# Patient Record
Sex: Female | Born: 1955 | Marital: Married | Smoking: Never smoker
Health system: Southern US, Community
[De-identification: ages and names within clinical notes are randomized; demographics above are authoritative.]

## PROBLEM LIST (undated history)

## (undated) DIAGNOSIS — IMO0002 Reserved for concepts with insufficient information to code with codable children: Secondary | ICD-10-CM

## (undated) DIAGNOSIS — K922 Gastrointestinal hemorrhage, unspecified: Secondary | ICD-10-CM

## (undated) DIAGNOSIS — M329 Systemic lupus erythematosus, unspecified: Secondary | ICD-10-CM

## (undated) DIAGNOSIS — I10 Essential (primary) hypertension: Secondary | ICD-10-CM

## (undated) HISTORY — PX: VARICOSE VEIN SURGERY: SHX832

---

## 1999-06-04 ENCOUNTER — Encounter: Payer: Self-pay | Admitting: Family Medicine

## 1999-06-04 ENCOUNTER — Ambulatory Visit (HOSPITAL_COMMUNITY): Admission: RE | Admit: 1999-06-04 | Discharge: 1999-06-04 | Payer: Self-pay | Admitting: Family Medicine

## 1999-07-21 ENCOUNTER — Other Ambulatory Visit: Admission: RE | Admit: 1999-07-21 | Discharge: 1999-07-21 | Payer: Self-pay | Admitting: Family Medicine

## 2000-04-07 ENCOUNTER — Encounter: Payer: Self-pay | Admitting: Family Medicine

## 2000-04-07 ENCOUNTER — Ambulatory Visit (HOSPITAL_COMMUNITY): Admission: RE | Admit: 2000-04-07 | Discharge: 2000-04-07 | Payer: Self-pay | Admitting: Family Medicine

## 2000-05-24 ENCOUNTER — Other Ambulatory Visit: Admission: RE | Admit: 2000-05-24 | Discharge: 2000-05-24 | Payer: Self-pay | Admitting: Obstetrics and Gynecology

## 2001-04-12 ENCOUNTER — Encounter: Payer: Self-pay | Admitting: Family Medicine

## 2001-04-12 ENCOUNTER — Ambulatory Visit (HOSPITAL_COMMUNITY): Admission: RE | Admit: 2001-04-12 | Discharge: 2001-04-12 | Payer: Self-pay | Admitting: Family Medicine

## 2001-06-28 ENCOUNTER — Other Ambulatory Visit: Admission: RE | Admit: 2001-06-28 | Discharge: 2001-06-28 | Payer: Self-pay | Admitting: Obstetrics and Gynecology

## 2002-06-05 ENCOUNTER — Other Ambulatory Visit: Admission: RE | Admit: 2002-06-05 | Discharge: 2002-06-05 | Payer: Self-pay | Admitting: Obstetrics and Gynecology

## 2003-07-26 ENCOUNTER — Other Ambulatory Visit: Admission: RE | Admit: 2003-07-26 | Discharge: 2003-07-26 | Payer: Self-pay | Admitting: Obstetrics and Gynecology

## 2003-08-21 ENCOUNTER — Ambulatory Visit (HOSPITAL_COMMUNITY): Admission: RE | Admit: 2003-08-21 | Discharge: 2003-08-21 | Payer: Self-pay | Admitting: Obstetrics and Gynecology

## 2004-08-28 ENCOUNTER — Other Ambulatory Visit: Admission: RE | Admit: 2004-08-28 | Discharge: 2004-08-28 | Payer: Self-pay | Admitting: Obstetrics and Gynecology

## 2008-01-09 ENCOUNTER — Ambulatory Visit (HOSPITAL_COMMUNITY): Admission: RE | Admit: 2008-01-09 | Discharge: 2008-01-09 | Payer: Self-pay | Admitting: *Deleted

## 2008-01-09 ENCOUNTER — Encounter (INDEPENDENT_AMBULATORY_CARE_PROVIDER_SITE_OTHER): Payer: Self-pay | Admitting: *Deleted

## 2009-02-25 ENCOUNTER — Other Ambulatory Visit: Admission: RE | Admit: 2009-02-25 | Discharge: 2009-02-25 | Payer: Self-pay | Admitting: Internal Medicine

## 2009-04-25 ENCOUNTER — Encounter: Admission: RE | Admit: 2009-04-25 | Discharge: 2009-04-25 | Payer: Self-pay | Admitting: Internal Medicine

## 2010-04-28 ENCOUNTER — Encounter: Admission: RE | Admit: 2010-04-28 | Discharge: 2010-04-28 | Payer: Self-pay | Admitting: Internal Medicine

## 2010-12-16 NOTE — Op Note (Signed)
NAMEJAIONNA, WEISSE                ACCOUNT NO.:  [id number]   MEDICAL RECORD NO.:  [id number]          PATIENT TYPE:  AMB   LOCATION:  ENDO                         FACILITY:  [facility]   PHYSICIAN:  Georgiana Spinner, M.D.    DATE OF BIRTH:  Dec 14, 1955   DATE OF PROCEDURE:  01/09/2008  DATE OF DISCHARGE:                               OPERATIVE REPORT   PROCEDURE:  Upper endoscopy.   INDICATIONS:  GERD.   ANESTHESIA:  Fentanyl 75 mcg, Versed 7.5 mg.   PROCEDURE:  With the patient mildly sedated in the left lateral  decubitus position, the Pentax videoscopic endoscope was inserted in the  mouth, passed under direct vision through the esophagus, which appeared  normal.  There was a hiatal hernia seen.  We entered into the stomach.  Fundus, body, antrum, duodenal bulb, second portion of the duodenum were  visualized.  From this point the endoscope was slowly withdrawn taking  circumferential views of the duodenal mucosa until the endoscope had  been pulled back into stomach, placed in retroflexion to view the  stomach from below.  The endoscope was straightened and withdrawn taking  circumferential views of the remaining gastric and esophageal mucosa.  The patient's vital signs and pulse oximetry remained stable.  The  patient tolerated the procedure well without apparent complications.   FINDINGS:  Loose wrap of the gastroesophageal junction indicating laxity  of the lower esophageal sphincter with a small hiatal hernia, otherwise  an unremarkable exam.   PLAN:  Proceed to colonoscopy.           ______________________________  Georgiana Spinner, M.D.     GMO/MEDQ  D:  01/09/2008  T:  01/09/2008  Job:  [id number]

## 2010-12-16 NOTE — Op Note (Signed)
[name], Amy Beard                ACCOUNT NO.:  [id number]   MEDICAL RECORD NO.:  [id number]          PATIENT TYPE:  AMB   LOCATION:  ENDO                         FACILITY:  [facility]   PHYSICIAN:  Georgiana Spinner, M.D.    DATE OF BIRTH:  1956-04-14   DATE OF PROCEDURE:  01/09/2008  DATE OF DISCHARGE:                               OPERATIVE REPORT   PROCEDURE:  Colonoscopy.   INDICATIONS:  Colon polyps, colon cancer screening.   ANESTHESIA:  Fentanyl 25 mcg, Versed 2.5 mg.   PROCEDURE:  With the patient mildly sedated in the left lateral  decubitus position, the Pentax videoscopic colonoscope was inserted in  the rectum and passed under direct vision with pressure applied to reach  the cecum, identified by ileocecal valve and appendiceal orifice, both  of which were photographed.  From this point the colonoscope was slowly  withdrawn taking circumferential views of colonic the mucosa, stopping  to photograph scattered diverticula seen throughout the colon until we  reached the rectum, where a polyp was seen on direct view and removed  using hot biopsy forceps technique, setting of 20/150 blended current,  and hemorrhoids were noted on retroflexed view.  The endoscope was  straightened and withdrawn.  The patient's vital signs and pulse  oximeter remained stable.  The patient tolerated the procedure well  without apparent complications.   FINDINGS:  Polyp of rectum, internal hemorrhoids and scattered  diverticula seen throughout the colon, fairly mild.   PLAN:  Await biopsy report.  The patient will call me for results and  follow up with me as needed as an outpatient.           ______________________________  Georgiana Spinner, M.D.     GMO/MEDQ  D:  01/09/2008  T:  01/09/2008  Job:  [id number]

## 2011-05-09 ENCOUNTER — Inpatient Hospital Stay (HOSPITAL_COMMUNITY)
Admission: EM | Admit: 2011-05-09 | Discharge: 2011-05-11 | DRG: 450 | Disposition: A | Discharge status: Home / Self Care | Payer: BC Managed Care – PPO | Attending: Internal Medicine | Admitting: Internal Medicine

## 2011-05-09 DIAGNOSIS — R000 Tachycardia, unspecified: Secondary | ICD-10-CM | POA: Diagnosis present

## 2011-05-09 DIAGNOSIS — R269 Unspecified abnormalities of gait and mobility: Secondary | ICD-10-CM | POA: Diagnosis present

## 2011-05-09 DIAGNOSIS — T6391XA Toxic effect of contact with unspecified venomous animal, accidental (unintentional), initial encounter: Principal | ICD-10-CM | POA: Diagnosis present

## 2011-05-09 DIAGNOSIS — Y998 Other external cause status: Secondary | ICD-10-CM

## 2011-05-09 DIAGNOSIS — R609 Edema, unspecified: Secondary | ICD-10-CM | POA: Diagnosis present

## 2011-05-09 DIAGNOSIS — T63121A Toxic effect of venom of other venomous lizard, accidental (unintentional), initial encounter: Secondary | ICD-10-CM | POA: Diagnosis present

## 2011-05-09 DIAGNOSIS — T63001A Toxic effect of unspecified snake venom, accidental (unintentional), initial encounter: Secondary | ICD-10-CM | POA: Diagnosis present

## 2011-05-09 DIAGNOSIS — Z7982 Long term (current) use of aspirin: Secondary | ICD-10-CM

## 2011-05-09 LAB — URINALYSIS, ROUTINE W REFLEX MICROSCOPIC
Leukocytes, UA: NEGATIVE
Nitrite: NEGATIVE
Specific Gravity, Urine: 1.008 (ref 1.005–1.030)
Urobilinogen, UA: 0.2 mg/dL (ref 0.0–1.0)
pH: 8 (ref 5.0–8.0)

## 2011-05-09 LAB — BASIC METABOLIC PANEL
BUN: 15 mg/dL (ref 6–23)
Chloride: 107 mEq/L (ref 96–112)
GFR calc Af Amer: >90 mL/min (ref >90)
GFR calc non Af Amer: >90 mL/min (ref >90)
Potassium: 3.5 mEq/L (ref 3.5–5.1)
Sodium: 143 mEq/L (ref 135–145)

## 2011-05-09 LAB — PROTIME-INR: Prothrombin Time: 13 seconds (ref 11.6–15.2)

## 2011-05-09 LAB — FIBRINOGEN: Fibrinogen: 431 mg/dL (ref 204–475)

## 2011-05-09 LAB — URINE MICROSCOPIC-ADD ON

## 2011-05-09 LAB — CBC
HCT: 40 % (ref 36.0–46.0)
Hemoglobin: 13 g/dL (ref 12.0–15.0)
MCH: 25.2 pg — ABNORMAL LOW (ref 26.0–34.0)
MCHC: 32.5 g/dL (ref 30.0–36.0)
RBC: 5.15 MIL/uL — ABNORMAL HIGH (ref 3.87–5.11)

## 2011-05-10 LAB — BASIC METABOLIC PANEL
BUN: 11 mg/dL (ref 6–23)
CO2: 25 mEq/L (ref 19–32)
Calcium: 9.4 mg/dL (ref 8.4–10.5)
Glucose, Bld: 132 mg/dL — ABNORMAL HIGH (ref 70–99)
Sodium: 141 mEq/L (ref 135–145)

## 2011-05-10 LAB — CBC
HCT: 38.8 % (ref 36.0–46.0)
Hemoglobin: 12.2 g/dL (ref 12.0–15.0)
MCH: 25.4 pg — ABNORMAL LOW (ref 26.0–34.0)
MCH: 25.5 pg — ABNORMAL LOW (ref 26.0–34.0)
MCHC: 31.9 g/dL (ref 30.0–36.0)
MCHC: 32.1 g/dL (ref 30.0–36.0)
MCHC: 32 g/dL (ref 30.0–36.0)
MCV: 79.5 fL (ref 78.0–100.0)
MCV: 79.3 fL (ref 78.0–100.0)
Platelets: 305 10*3/uL (ref 150–400)
Platelets: 321 10*3/uL (ref 150–400)
Platelets: 320 10*3/uL (ref 150–400)
Platelets: 281 10*3/uL (ref 150–400)
RBC: 4.87 MIL/uL (ref 3.87–5.11)
RBC: 4.89 MIL/uL (ref 3.87–5.11)
RDW: 13.8 % (ref 11.5–15.5)
RDW: 14 % (ref 11.5–15.5)
RDW: 14.1 % (ref 11.5–15.5)
RDW: 14.1 % (ref 11.5–15.5)
WBC: 10.1 10*3/uL (ref 4.0–10.5)
WBC: 11.1 10*3/uL — ABNORMAL HIGH (ref 4.0–10.5)

## 2011-05-10 LAB — FIBRINOGEN
Fibrinogen: 450 mg/dL (ref 204–475)
Fibrinogen: 462 mg/dL (ref 204–475)
Fibrinogen: 439 mg/dL (ref 204–475)
Fibrinogen: 414 mg/dL (ref 204–475)

## 2011-05-10 LAB — PROTIME-INR
INR: 0.97 (ref 0.00–1.49)
INR: 0.96 (ref 0.00–1.49)
Prothrombin Time: 13.5 seconds (ref 11.6–15.2)
Prothrombin Time: 13.7 seconds (ref 11.6–15.2)

## 2011-05-10 LAB — GLUCOSE, CAPILLARY
Glucose-Capillary: 112 mg/dL — ABNORMAL HIGH (ref 70–99)
Glucose-Capillary: 105 mg/dL — ABNORMAL HIGH (ref 70–99)

## 2011-05-11 LAB — URINE CULTURE

## 2011-05-11 LAB — GLUCOSE, CAPILLARY
Glucose-Capillary: 92 mg/dL (ref 70–99)
Glucose-Capillary: 91 mg/dL (ref 70–99)
Glucose-Capillary: 80 mg/dL (ref 70–99)
Glucose-Capillary: 89 mg/dL (ref 70–99)

## 2011-05-12 ENCOUNTER — Other Ambulatory Visit: Payer: Self-pay | Admitting: Internal Medicine

## 2011-05-12 DIAGNOSIS — Z1231 Encounter for screening mammogram for malignant neoplasm of breast: Secondary | ICD-10-CM

## 2011-05-14 NOTE — Discharge Summary (Signed)
  NAMERHONDA, LINAN                ACCOUNT NO.:  [id number]  MEDICAL RECORD NO.:  [id number]  LOCATION:  5012                         FACILITY:  [facility]  PHYSICIAN:  Hollice Espy, M.D.DATE OF BIRTH:  Oct 18, 1955  DATE OF ADMISSION:  05/09/2011 DATE OF DISCHARGE:  05/11/2011                              DISCHARGE SUMMARY   ATTENDING PHYSICIAN:  Hollice Espy, MD  PRIMARY CARE PHYSICIAN:  Juline Patch, MD  DISCHARGE DIAGNOSES:  Snake bite to right foot, venomous with secondary swelling and dysfunctional gait.  All diagnoses present on admission.  DISCHARGE MEDICATIONS: 1. Tylenol 650 p.o. q.4 h. p.r.n. 2. Motrin 200 mg 1-2 tabs every 8 hours as needed for inflammation. 3. OxyIR 5 mg p.o. q.4 h. p.r.n. for pain.  HOSPITAL COURSE:  The patient is a 55 year old African American female with no past medical history who was getting out of her car when all of a sudden she was felt a pain in her right foot, she looked down and had been bitten bite a small snake.  Reportedly the snake was killed and EMS arrived when the patient was having severe pain and swelling and unable to walk.  They evaluated the snake and they felt that perhaps it was a copperhead.  Regarding the patient's pain and swelling, she received 4 g of CroFab.  She was monitored in the hospital.  She had difficulty ambulating.  Dr. Lindie Spruce from Surgery was consulted to evaluate for compartment syndrome.  He felt the patient was stable.  PT was consulted and evaluated.  She did not felt having any acute PT needs but did need crutches.  The patient was given crutches.  She otherwise was subsequently doing well.  She was advised for the next week to not drive, to no work, and to keep her leg elevated with ambulation via crutches.  We advised her to do so and to be able to return back to work and to drive when she feels like she is able to fully flex her foot.  In the meantime I am advising some motion to prevent DVT.   Her discharge diet is regular diet.  Disposition improved.  Activity slow to increase and described as above and she will follow up with her PCP Dr. Juline Patch in the next month as needed.     Hollice Espy, M.D.     SKK/MEDQ  D:  05/11/2011  T:  05/11/2011  Job:  [id number]  cc:   Cherylynn Ridges, M.D. Juline Patch, M.D.  Electronically Signed by Virginia Rochester M.D. on 05/14/2011 10:36:17 AM

## 2011-05-16 LAB — CULTURE, BLOOD (ROUTINE X 2)
Culture  Setup Time: 201210071141
Culture: NO GROWTH

## 2011-05-21 NOTE — H&P (Signed)
NAMEPERSEPHONE, SCHRIEVER NO.:  [id number]  MEDICAL RECORD NO.:  [id number]  LOCATION:  [facility]                         FACILITY:  [facility]  PHYSICIAN:  Carlota Raspberry, MD         DATE OF BIRTH:  06/10/1956  DATE OF ADMISSION:  05/09/2011 DATE OF DISCHARGE:                             HISTORY & PHYSICAL   PRIMARY CARE PHYSICIAN:  Juline Patch, MD  CHIEF COMPLAINT:  Snake bite with right foot swelling and pain.  HISTORY OF PRESENT ILLNESS:  This is a 55 year old female with no major medical problems who was getting out of a car to go to a family event due to the death of a family member.  When she got out of her car, she felt a bite on her right foot and her husband saw a snake that was approximately 2-3 feet.  It is unclear, but the snake was possibly killed and EMS took a picture and per verbal sign out from the ED, the snake appeared to be consistent with a baby copperhead.  She was brought to the emergency room by EMS and found to be 141/82, pulse 111, 18, 98.3 and 99% on 4 L nasal cannula.  A snake bite emergency management protocol was initiated in the ED and she was felt to have minimal to moderate criteria according to the protocol for an initial tachycardia of 100-125 and pain, swelling and ecchymosis of the extremity. Therefore, 4 g of CroFab antibody was given to the patient.  Her workup in the ED including CBC, chemistry coags and fibrinogen were otherwise unremarkable.  She is being admitted to the Step-Down Unit for further monitoring.  In discussion with the patient, her husband and her son who are at the bedside, they relate the above history and that she is doing much better now that she has been given pain medications.  She is currently without any complaints and the right foot is doing better.  Otherwise, review of systems extensively negative.  PAST MEDICAL HISTORY:  GERD, hiatal hernia.  Otherwise, they deny any history of hypertension, diabetes,  cancer, CVAs, cardiac issues or any other medical problems.  MEDICATIONS:  She takes a baby aspirin prophylactically.  ALLERGIES:  She has no known drug allergies.  SOCIAL HISTORY:  She lives at home with her husband and has 2 sons.  She is a never smoker and does not drink or do any drugs.  FAMILY HISTORY:  Her mother and father both had hypertension and diabetes.  PHYSICAL EXAMINATION:  VITAL SIGNS:  Blood pressure is 112/64 ranging 104-115/50s-70s on the ED monitor, pulse of 70, 100% on room air. GENERAL:  She is a average-sized lady who is not obese.  She is lying in the ED stretcher and appears groggy from the pain medicines, but is able to awaken to voice answer questions, but dozes off.  She is in no distress at present. HEENT:  Pupils are equal, round and reactive to light.  Extraocular muscles are intact.  Her sclerae clear.  Her mouth is normal, but her tongue appears very dry. LUNGS:  Clear to auscultation bilaterally.  No wheezes, crackles or rhonchi and overall unimpressive.  HEART:  Regular rate and rhythm with no murmurs or gallops appreciated. ABDOMEN:  A bit overweight, but it is soft, nontender, nondistended and benign. EXTREMITIES:  Her bilateral upper extremities are unimpressive with bilateral radial pulses palpable and normal muscle tone and strength with an 18-gauge in her left hand.  Her left lower extremity is also completely normal.  However, her right foot shows 2 bloody scab covered puncture marks on the lateral aspect of her right foot about 2-cm proximal to the right fifth MTP joint.  There is gross swelling extending from these puncture marks going up to her ankle and wrapping around posteriorly around her ankle.  There is also a purplish grayish duskiness to her right foot that is not present on the left.  It is minimally tender and a bit more cool compared to the left, but it does not appear acutely ischemic.  She is able to move the foot.  There  is no cyanosis.  LABORATORY DATA:  White blood cell count of 9.7, hematocrit 40.0, platelets 327.  INR is 0.96.  Fibrinogen 431.  Chemistry is completely normal including renal function of 15 and 0.6, calcium is 10.0.  UA is cloudy with trace blood, otherwise normal and U culture is pending.  There is no radiography to review.  IMPRESSION:  This is a 55 year old healthy female who was getting out of her car earlier tonight and felt a bite that maybe consistent with a copperhead per picture taken by EMS. 1. Copperhead bite to right foot per the hospital protocol for snake     bite emergencies.  She had 3 points on the severity criteria for an     initial tachycardia and pain and swelling of the wound.  She was     given 4 g of CroFab antidote.  We will admit her to step-down and     monitor her overnight for response and continued to monitor her     vital signs and cardiopulmonary systems.  We will also monitor labs     (CBC, coags and fibrinogen q.4 h. x12 hours and BMET).  She may     need a PT consult in the morning to see if she will be able to walk     on this foot. 2. There are no other acute medical issues. 3. Fluid electrolytes and nutrition.  We will run her continuous 100     mL of normal saline for 2 L and give her regular diet. 4. Prophylaxis SCDs.  I will avoid subcutaneous heparin given the     question of coagulopathy in the setting of a snake bite.  Also;     Zofran, Colace senna, Tylenol, oxycodone and Dilaudid p.r.n. for     foot pain.  CODE STATUS: 1. She is a full code as I discussed with her and her husband. 2. IV access.  She has an 18-gauge in her left hand. 3. The patient will be admitted to the Step-Down Unit to [facility]     Team 7.          ______________________________ Carlota Raspberry, MD     EB/MEDQ  D:  05/10/2011  T:  05/10/2011  Job:  [id number]  Electronically Signed by Carlota Raspberry MD on 05/21/2011 07:46:48 PM

## 2011-06-04 ENCOUNTER — Other Ambulatory Visit: Payer: Self-pay | Admitting: Internal Medicine

## 2011-06-04 DIAGNOSIS — M79606 Pain in leg, unspecified: Secondary | ICD-10-CM

## 2011-06-05 ENCOUNTER — Ambulatory Visit (INDEPENDENT_AMBULATORY_CARE_PROVIDER_SITE_OTHER): Payer: BC Managed Care – PPO | Admitting: *Deleted

## 2011-06-05 DIAGNOSIS — M7989 Other specified soft tissue disorders: Secondary | ICD-10-CM

## 2011-06-05 DIAGNOSIS — M79609 Pain in unspecified limb: Secondary | ICD-10-CM

## 2011-06-08 NOTE — Procedures (Unsigned)
DUPLEX DEEP VENOUS EXAM - LOWER EXTREMITY  INDICATION:  Swelling, tenderness in the calf, rule out DVT.  HISTORY:  Edema:  Right foot and ankle swelling. Trauma/Surgery:  Recent right foot snake bite. Pain:  Right foot/ankle pain. PE:  No. Previous DVT:  No. Anticoagulants: Other:  DUPLEX EXAM:               CFV   SFV   PopV  PTV    GSV               R  L  R  L  R  L  R   L  R  L Thrombosis    o  o  o     o     o      o Spontaneous   +  +  +     +     +      + Phasic        +  +  +     +     +      + Augmentation  +  +  +     +     +      + Compressible  +  +  +     +     +      + Competent  Legend:  + - yes  o - no  p - partial  D - decreased  IMPRESSION: 1. No evidence of deep or superficial venous thrombosis noted in the     right lower extremity. 2. Preliminary findings stating the patency and compressibility of the     right lower extremity venous system was called to Dr. Ricki Miller at 11:30     a.m. on 06/05/2011.   _____________________________ Quita Skye. Hart Rochester, M.D.  CH/MEDQ  D:  06/05/2011  T:  06/05/2011  Job:  [id number]

## 2011-06-18 ENCOUNTER — Ambulatory Visit: Payer: Self-pay

## 2011-06-22 ENCOUNTER — Ambulatory Visit
Admission: RE | Admit: 2011-06-22 | Discharge: 2011-06-22 | Disposition: A | Discharge status: Home / Self Care | Payer: Self-pay | Source: Ambulatory Visit | Attending: Internal Medicine | Admitting: Internal Medicine

## 2011-06-22 DIAGNOSIS — Z1231 Encounter for screening mammogram for malignant neoplasm of breast: Secondary | ICD-10-CM

## 2012-06-03 ENCOUNTER — Other Ambulatory Visit: Payer: Self-pay | Admitting: Internal Medicine

## 2012-06-03 DIAGNOSIS — Z1231 Encounter for screening mammogram for malignant neoplasm of breast: Secondary | ICD-10-CM

## 2012-07-06 ENCOUNTER — Ambulatory Visit: Payer: Self-pay

## 2012-07-14 ENCOUNTER — Ambulatory Visit
Admission: RE | Admit: 2012-07-14 | Discharge: 2012-07-14 | Disposition: A | Discharge status: Home / Self Care | Payer: BC Managed Care – PPO | Source: Ambulatory Visit | Attending: Internal Medicine | Admitting: Internal Medicine

## 2012-07-14 DIAGNOSIS — Z1231 Encounter for screening mammogram for malignant neoplasm of breast: Secondary | ICD-10-CM

## 2013-02-01 ENCOUNTER — Other Ambulatory Visit: Payer: Self-pay | Admitting: Internal Medicine

## 2013-02-01 DIAGNOSIS — E041 Nontoxic single thyroid nodule: Secondary | ICD-10-CM

## 2013-02-02 ENCOUNTER — Other Ambulatory Visit: Payer: Self-pay

## 2013-02-02 ENCOUNTER — Ambulatory Visit
Admission: RE | Admit: 2013-02-02 | Discharge: 2013-02-02 | Disposition: A | Discharge status: Home / Self Care | Payer: BC Managed Care – PPO | Source: Ambulatory Visit | Attending: Internal Medicine | Admitting: Internal Medicine

## 2013-02-02 DIAGNOSIS — E041 Nontoxic single thyroid nodule: Secondary | ICD-10-CM

## 2013-02-08 ENCOUNTER — Other Ambulatory Visit: Payer: Self-pay | Admitting: Internal Medicine

## 2013-02-08 DIAGNOSIS — E049 Nontoxic goiter, unspecified: Secondary | ICD-10-CM

## 2013-06-22 ENCOUNTER — Other Ambulatory Visit: Payer: Self-pay

## 2013-06-22 DIAGNOSIS — Z1231 Encounter for screening mammogram for malignant neoplasm of breast: Secondary | ICD-10-CM

## 2013-07-19 ENCOUNTER — Ambulatory Visit
Admission: RE | Admit: 2013-07-19 | Discharge: 2013-07-19 | Disposition: A | Discharge status: Home / Self Care | Payer: BC Managed Care – PPO | Source: Ambulatory Visit

## 2013-07-19 DIAGNOSIS — Z1231 Encounter for screening mammogram for malignant neoplasm of breast: Secondary | ICD-10-CM

## 2013-08-14 ENCOUNTER — Other Ambulatory Visit: Payer: Self-pay

## 2013-09-08 ENCOUNTER — Ambulatory Visit
Admission: RE | Admit: 2013-09-08 | Discharge: 2013-09-08 | Disposition: A | Discharge status: Home / Self Care | Payer: BC Managed Care – PPO | Source: Ambulatory Visit | Attending: Internal Medicine | Admitting: Internal Medicine

## 2013-09-08 DIAGNOSIS — E049 Nontoxic goiter, unspecified: Secondary | ICD-10-CM

## 2013-10-09 ENCOUNTER — Other Ambulatory Visit (HOSPITAL_COMMUNITY)
Admission: RE | Admit: 2013-10-09 | Discharge: 2013-10-09 | Disposition: A | Discharge status: Home / Self Care | Payer: BC Managed Care – PPO | Source: Ambulatory Visit | Attending: Internal Medicine | Admitting: Internal Medicine

## 2013-10-09 ENCOUNTER — Other Ambulatory Visit: Payer: Self-pay | Admitting: Registered Nurse

## 2013-10-09 DIAGNOSIS — Z01419 Encounter for gynecological examination (general) (routine) without abnormal findings: Secondary | ICD-10-CM | POA: Insufficient documentation

## 2013-12-13 ENCOUNTER — Emergency Department (HOSPITAL_COMMUNITY): Payer: BC Managed Care – PPO

## 2013-12-13 ENCOUNTER — Encounter (HOSPITAL_COMMUNITY): Payer: Self-pay | Admitting: Emergency Medicine

## 2013-12-13 DIAGNOSIS — I10 Essential (primary) hypertension: Secondary | ICD-10-CM | POA: Insufficient documentation

## 2013-12-13 DIAGNOSIS — Z7982 Long term (current) use of aspirin: Secondary | ICD-10-CM | POA: Insufficient documentation

## 2013-12-13 DIAGNOSIS — R209 Unspecified disturbances of skin sensation: Secondary | ICD-10-CM | POA: Insufficient documentation

## 2013-12-13 LAB — CBC
HCT: 39.9 % (ref 36.0–46.0)
Hemoglobin: 12.7 g/dL (ref 12.0–15.0)
MCH: 25.4 pg — AB (ref 26.0–34.0)
MCHC: 31.8 g/dL (ref 30.0–36.0)
MCV: 79.8 fL (ref 78.0–100.0)
PLATELETS: 332 10*3/uL (ref 150–400)
RBC: 5 MIL/uL (ref 3.87–5.11)
RDW: 13.8 % (ref 11.5–15.5)
WBC: 7.1 10*3/uL (ref 4.0–10.5)

## 2013-12-13 LAB — BASIC METABOLIC PANEL
BUN: 10 mg/dL (ref 6–23)
CALCIUM: 9.7 mg/dL (ref 8.4–10.5)
CO2: 27 mEq/L (ref 19–32)
Chloride: 100 mEq/L (ref 96–112)
Creatinine, Ser: 0.65 mg/dL (ref 0.50–1.10)
Glucose, Bld: 96 mg/dL (ref 70–99)
Potassium: 4.3 mEq/L (ref 3.7–5.3)
SODIUM: 139 meq/L (ref 137–147)

## 2013-12-13 LAB — I-STAT TROPONIN, ED: TROPONIN I, POC: 0 ng/mL (ref 0.00–0.08)

## 2013-12-13 NOTE — ED Notes (Addendum)
Pt sattes she has been having HTN at home for two days similar to her triage BP.  Pt states some numbness in her face.  Pt states headache earlier but took 650mg  of tylenol prior to arrival

## 2013-12-14 ENCOUNTER — Emergency Department (HOSPITAL_COMMUNITY)
Admission: EM | Admit: 2013-12-14 | Discharge: 2013-12-14 | Disposition: A | Discharge status: Home / Self Care | Payer: BC Managed Care – PPO | Attending: Emergency Medicine | Admitting: Emergency Medicine

## 2013-12-14 DIAGNOSIS — I10 Essential (primary) hypertension: Secondary | ICD-10-CM

## 2013-12-14 HISTORY — DX: Essential (primary) hypertension: I10

## 2013-12-14 NOTE — Discharge Instructions (Signed)
Continue checking your blood pressure and keeping a log.  Check twice a day.  Follow up as scheduled.  Return to the ER for worsening condition or new concerning symptoms.   How to Take Your Blood Pressure  These instructions are only for electronic home blood pressure machines. You will need:   An automatic or semi-automatic blood pressure machine.  Fresh batteries for the blood pressure machine. HOW DO I USE THESE TOOLS TO CHECK MY BLOOD PRESSURE?   There are 2 numbers that make up your blood pressure. For example: 120/80.  The first number (120 in our example) is called the "systolic pressure." It is a measure of the pressure in your blood vessels when your heart is pumping blood.  The second number (80 in our example) is called the "diastolic pressure." It is a measure of the pressure in your blood vessels when your heart is resting between beats.  Before you buy a home blood pressure machine, check the size of your arm so you can buy the right size cuff. Here is how to check the size of your arm:  Use a tape measure that shows both inches and centimeters.  Wrap the tape measure around the middle upper part of your arm. You may need someone to help you measure right.  Write down your arm measurement in both inches and centimeters.  To measure your blood pressure right, it is important to have the right size cuff.  If your arm is up to 13 inches (37 to 34 centimeters), get an adult cuff size.  If your arm is 13 to 17 inches (35 to 44 centimeters), get a large adult cuff size.  If your arm is 17 to 20 inches (45 to 52 centimeters), get an adult thigh cuff.  Try to rest or relax for at least 30 minutes before you check your blood pressure.  Do not smoke.  Do not have any drinks with caffeine, such as:  Pop.  Coffee.  Tea.  Check your blood pressure in a quiet room.  Sit down and stretch out your arm on a table. Keep your arm at about the level of your heart. Let your  arm relax. GETTING BLOOD PRESSURE READINGS  Make sure you remove any tight-fighting clothing from your arm. Wrap the cuff around your upper arm. Wrap it just above the bend, and above where you felt the pulse. You should be able to slip a finger between the cuff and your arm. If you cannot slip a finger in the cuff, it is too tight and should be removed and rewrapped.  Some units requires you to manually pump up the arm cuff.  Automatic units inflate the cuff when you press a button.  Cuff deflation is automatic in both models.  After the cuff is inflated, the unit measures your blood pressure and pulse. The readings are displayed on a monitor. Hold still and breathe normally while the cuff is inflated.  Getting a reading takes less than a minute.  Some models store readings in a memory. Some provide a printout of readings.  Get readings at different times of the day. You should wait at least 5 minutes between readings. Take readings with you to your next doctor's visit. Document Released: 07/02/2008 Document Revised: 10/12/2011 Document Reviewed: 07/02/2008 [facility] Patient Information 2014 [place], [place].  DASH Diet The DASH diet stands for "Dietary Approaches to Stop Hypertension." It is a healthy eating plan that has been shown to reduce high blood pressure (  hypertension) in as little as 14 days, while also possibly providing other significant health benefits. These other health benefits include reducing the risk of breast cancer after menopause and reducing the risk of type 2 diabetes, heart disease, colon cancer, and stroke. Health benefits also include weight loss and slowing kidney failure in patients with chronic kidney disease.  DIET GUIDELINES  Limit salt (sodium). Your diet should contain less than 1500 mg of sodium daily.  Limit refined or processed carbohydrates. Your diet should include mostly whole grains. Desserts and added sugars should be used sparingly.  Include  small amounts of heart-healthy fats. These types of fats include nuts, oils, and tub margarine. Limit saturated and trans fats. These fats have been shown to be harmful in the body. CHOOSING FOODS  The following food groups are based on a 2000 calorie diet. See your Registered Dietitian for individual calorie needs. Grains and Grain Products (6 to 8 servings daily)  Eat More Often: Whole-wheat bread, brown rice, whole-grain or wheat pasta, quinoa, popcorn without added fat or salt (air popped).  Eat Less Often: White bread, white pasta, white rice, cornbread. Vegetables (4 to 5 servings daily)  Eat More Often: Fresh, frozen, and canned vegetables. Vegetables may be raw, steamed, roasted, or grilled with a minimal amount of fat.  Eat Less Often/Avoid: Creamed or fried vegetables. Vegetables in a cheese sauce. Fruit (4 to 5 servings daily)  Eat More Often: All fresh, canned (in natural juice), or frozen fruits. Dried fruits without added sugar. One hundred percent fruit juice ( cup [237 mL] daily).  Eat Less Often: Dried fruits with added sugar. Canned fruit in light or heavy syrup. Foot LockerLean Meats, Fish, and Poultry (2 servings or less daily. One serving is 3 to 4 oz [85-114 g]).  Eat More Often: Ninety percent or leaner ground beef, tenderloin, sirloin. Round cuts of beef, chicken breast, Mal[place]key breast. All fish. Grill, bake, or broil your meat. Nothing should be fried.  Eat Less Often/Avoid: Fatty cuts of meat, [place]key, or chicken leg, thigh, or wing. Fried cuts of meat or fish. Dairy (2 to 3 servings)  Eat More Often: Low-fat or fat-free milk, low-fat plain or light yogurt, reduced-fat or part-skim cheese.  Eat Less Often/Avoid: Milk (whole, 2%).Whole milk yogurt. Full-fat cheeses. Nuts, Seeds, and Legumes (4 to 5 servings per week)  Eat More Often: All without added salt.  Eat Less Often/Avoid: Salted nuts and seeds, canned beans with added salt. Fats and Sweets (limited)  Eat More  Often: Vegetable oils, tub margarines without trans fats, sugar-free gelatin. Mayonnaise and salad dressings.  Eat Less Often/Avoid: Coconut oils, palm oils, butter, stick margarine, cream, half and half, cookies, candy, pie. FOR MORE INFORMATION The Dash Diet Eating Plan: www.dashdiet.org Document Released: 07/09/2011 Document Revised: 10/12/2011 Document Reviewed: 07/09/2011 [facility]Exit[facility] Patient Information 2014 [place]ExitCare, [place]LL[place].

## 2013-12-14 NOTE — ED Provider Notes (Signed)
CSN: [id number]     Arrival date & time 12/13/13  2257 History   First MD Initiated Contact with Patient 12/14/13 0123     Chief Complaint  Patient presents with  . Hypertension     (Consider location/radiation/quality/duration/timing/severity/associated sxs/prior Treatment) HPI 58 yo female presents to the ER from home with complaint of elevated bp and numbness.  Over the last two days she has had BPs 150s/90.  She has been checking her bp on the advice from her pcm in March, has f/u with him on 5/26 for recheck.  Today along with the elevate BP she had a headache and a patch of numbness to the left outer upper calf and the left crease of her face.  Pt took two different herbal teas for stress and high BP, but doesn't feel they helped.  No weakness. HA has resolved with tylenol.  No prior h/o stroke, htn.  Takes daily aspirin.  Numbness has since resolved. Past Medical History  Diagnosis Date  . Hypertension    History reviewed. No pertinent past surgical history. No family history on file. History  Substance Use Topics  . Smoking status: Never Smoker   . Smokeless tobacco: Not on file  . Alcohol Use: No   OB History   Grav Para Term Preterm Abortions TAB SAB Ect Mult Living                 Review of Systems   See History of Present Illness; otherwise all other systems are reviewed and negative  Allergies  Review of patient's allergies indicates no known allergies.  Home Medications   Prior to Admission medications   Medication Sig Start Date End Date Taking? Authorizing Provider  aspirin EC 81 MG tablet Take 81 mg by mouth daily.   Yes Historical Provider, MD  Vitamin D, Ergocalciferol, (DRISDOL) 50000 UNITS CAPS capsule Take 50,000 Units by mouth every 7 (seven) days.   Yes Historical Provider, MD   BP 123/78  Pulse 96  Temp(Src) 97.8 F (36.6 C) (Oral)  Resp 18  SpO2 95% Physical Exam  Nursing note and vitals reviewed. Constitutional: She is oriented to person,  place, and time. She appears well-developed and well-nourished.  HENT:  Head: Normocephalic and atraumatic.  Right Ear: External ear normal.  Left Ear: External ear normal.  Nose: Nose normal.  Mouth/Throat: Oropharynx is clear and moist.  Eyes: Conjunctivae and EOM are normal. Pupils are equal, round, and reactive to light.  Neck: Normal range of motion. Neck supple. No JVD present. No tracheal deviation present. No thyromegaly present.  Cardiovascular: Normal rate, regular rhythm, normal heart sounds and intact distal pulses.  Exam reveals no gallop and no friction rub.   No murmur heard. Pulmonary/Chest: Effort normal and breath sounds normal. No stridor. No respiratory distress. She has no wheezes. She has no rales. She exhibits no tenderness.  Abdominal: Soft. Bowel sounds are normal. She exhibits no distension and no mass. There is no tenderness. There is no rebound and no guarding.  Musculoskeletal: Normal range of motion. She exhibits no edema and no tenderness.  Lymphadenopathy:    She has no cervical adenopathy.  Neurological: She is alert and oriented to person, place, and time. She has normal reflexes. No cranial nerve deficit. She exhibits normal muscle tone. Coordination normal.  Skin: Skin is warm and dry. No rash noted. No erythema. No pallor.  Psychiatric: She has a normal mood and affect. Her behavior is normal. Judgment and thought content normal.  ED Course  Procedures (including critical care time) Labs Review Labs Reviewed  CBC - Abnormal; Notable for the following:    MCH 25.4 (*)    All other components within normal limits  BASIC METABOLIC PANEL  I-STAT TROPOININ, ED    Imaging Review Dg Chest 2 View  12/14/2013   CLINICAL DATA:  Hypertension  EXAM: CHEST  2 VIEW  COMPARISON:  None.  FINDINGS: The heart size and mediastinal contours are within normal limits. Both lungs are clear. The visualized skeletal structures are unremarkable.  IMPRESSION: No active  cardiopulmonary disease.   Electronically Signed   By: Alcide CleverMark  Lukens M.D.   On: 12/14/2013 00:05     EKG Interpretation   Date/Time:  Wednesday Dec 13 2013 23:09:38 EDT Ventricular Rate:  72 PR Interval:  140 QRS Duration: 110 QT Interval:  434 QTC Calculation: 475 R Axis:   -6 Text Interpretation:  Normal sinus rhythm Incomplete right bundle branch  block Borderline ECG No old tracing to compare Confirmed by Kenidy Crossland  MD,  Avereigh Spainhower ([id number]) on 12/14/2013 12:47:56 AM      MDM   Final diagnoses:  Hypertension    58 year old female with transitory paresthesias, not in a typical distribution associated with elevated blood pressure which has since resolved without intervention.  Patient has close followup scheduled with her primary care Dr.  Davina PokeInstructed patient to continue with her blood pressure monitoring.  Precautions given for return.   [name], MD 12/14/13 [phone]

## 2014-09-10 ENCOUNTER — Other Ambulatory Visit: Payer: Self-pay

## 2014-09-10 DIAGNOSIS — Z1231 Encounter for screening mammogram for malignant neoplasm of breast: Secondary | ICD-10-CM

## 2014-09-18 ENCOUNTER — Ambulatory Visit
Admission: RE | Admit: 2014-09-18 | Discharge: 2014-09-18 | Disposition: A | Discharge status: Home / Self Care | Payer: BLUE CROSS/BLUE SHIELD | Source: Ambulatory Visit

## 2014-09-18 ENCOUNTER — Encounter (INDEPENDENT_AMBULATORY_CARE_PROVIDER_SITE_OTHER): Payer: Self-pay

## 2014-09-18 DIAGNOSIS — Z1231 Encounter for screening mammogram for malignant neoplasm of breast: Secondary | ICD-10-CM

## 2015-08-06 ENCOUNTER — Other Ambulatory Visit: Payer: Self-pay | Admitting: Internal Medicine

## 2015-08-06 DIAGNOSIS — R200 Anesthesia of skin: Secondary | ICD-10-CM

## 2015-08-13 ENCOUNTER — Other Ambulatory Visit: Payer: Self-pay | Admitting: Internal Medicine

## 2015-08-13 DIAGNOSIS — R200 Anesthesia of skin: Secondary | ICD-10-CM

## 2015-08-21 ENCOUNTER — Ambulatory Visit
Admission: RE | Admit: 2015-08-21 | Discharge: 2015-08-21 | Disposition: A | Discharge status: Home / Self Care | Payer: BLUE CROSS/BLUE SHIELD | Source: Ambulatory Visit | Attending: Internal Medicine | Admitting: Internal Medicine

## 2015-08-21 DIAGNOSIS — R200 Anesthesia of skin: Secondary | ICD-10-CM

## 2015-08-25 ENCOUNTER — Ambulatory Visit
Admission: RE | Admit: 2015-08-25 | Discharge: 2015-08-25 | Disposition: A | Discharge status: Home / Self Care | Payer: BLUE CROSS/BLUE SHIELD | Source: Ambulatory Visit | Attending: Internal Medicine | Admitting: Internal Medicine

## 2015-08-25 MED ORDER — GADOBENATE DIMEGLUMINE 529 MG/ML IV SOLN
20.0000 mL | Freq: Once | INTRAVENOUS | Status: AC | PRN
  Administered 2015-08-25: 20 mL via INTRAVENOUS

## 2015-12-18 ENCOUNTER — Encounter (HOSPITAL_COMMUNITY): Payer: Self-pay | Admitting: Family Medicine

## 2015-12-18 ENCOUNTER — Emergency Department (HOSPITAL_COMMUNITY): Payer: BLUE CROSS/BLUE SHIELD

## 2015-12-18 ENCOUNTER — Emergency Department (HOSPITAL_COMMUNITY)
Admission: EM | Admit: 2015-12-18 | Discharge: 2015-12-18 | Disposition: A | Discharge status: Home / Self Care | Payer: BLUE CROSS/BLUE SHIELD | Attending: Emergency Medicine | Admitting: Emergency Medicine

## 2015-12-18 DIAGNOSIS — Z1231 Encounter for screening mammogram for malignant neoplasm of breast: Secondary | ICD-10-CM

## 2015-12-18 DIAGNOSIS — I10 Essential (primary) hypertension: Secondary | ICD-10-CM | POA: Insufficient documentation

## 2015-12-18 DIAGNOSIS — Z79899 Other long term (current) drug therapy: Secondary | ICD-10-CM | POA: Insufficient documentation

## 2015-12-18 DIAGNOSIS — Z7982 Long term (current) use of aspirin: Secondary | ICD-10-CM | POA: Insufficient documentation

## 2015-12-18 DIAGNOSIS — Z8719 Personal history of other diseases of the digestive system: Secondary | ICD-10-CM | POA: Insufficient documentation

## 2015-12-18 DIAGNOSIS — R079 Chest pain, unspecified: Secondary | ICD-10-CM | POA: Diagnosis present

## 2015-12-18 DIAGNOSIS — R0789 Other chest pain: Secondary | ICD-10-CM | POA: Diagnosis not present

## 2015-12-18 LAB — CBC
HCT: 40 % (ref 36.0–46.0)
HEMOGLOBIN: 12.6 g/dL (ref 12.0–15.0)
MCH: 25.2 pg — ABNORMAL LOW (ref 26.0–34.0)
MCHC: 31.5 g/dL (ref 30.0–36.0)
MCV: 80 fL (ref 78.0–100.0)
Platelets: 320 10*3/uL (ref 150–400)
RBC: 5 MIL/uL (ref 3.87–5.11)
RDW: 14.2 % (ref 11.5–15.5)
WBC: 6.8 10*3/uL (ref 4.0–10.5)

## 2015-12-18 LAB — I-STAT TROPONIN, ED
TROPONIN I, POC: 0 ng/mL (ref 0.00–0.08)
Troponin i, poc: 0 ng/mL (ref 0.00–0.08)

## 2015-12-18 LAB — BASIC METABOLIC PANEL
Anion gap: 10 (ref 5–15)
BUN: 9 mg/dL (ref 6–20)
CO2: 25 mmol/L (ref 22–32)
Calcium: 9.2 mg/dL (ref 8.9–10.3)
Chloride: 105 mmol/L (ref 101–111)
Creatinine, Ser: 0.7 mg/dL (ref 0.44–1.00)
GFR calc Af Amer: >60 mL/min (ref >60)
Glucose, Bld: 89 mg/dL (ref 65–99)
Potassium: 3.8 mmol/L (ref 3.5–5.1)
Sodium: 140 mmol/L (ref 135–145)

## 2015-12-18 NOTE — ED Notes (Signed)
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 sts sharp stabbing. sts some nausea. sts she hurts when she takes a deep breath.

## 2015-12-19 NOTE — ED Provider Notes (Signed)
CSN: [id number]     Arrival date & time 12/18/15  1154 History   First MD Initiated Contact with Patient 12/18/15 1310     Chief Complaint  Patient presents with  . Chest Pain    HPI Comments: 60 year old female presents with acute onset of chest pain starting several hours ago. She has never had this pain before. PMH significant for HTN which she states is not treated because it has gotten better and GERD. 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 She tried to sit down and take an ASA to see if the pain would go away however it did not so she decided to come to the ED. The pain is on the left side of her chest, is constant, stabbing, does not radiate. Worse with inspiration and movement. It has gotten better since she has been in the ER. No personal cardiac history. No tobacco use. Denies fever, diaphoresis, headache, palpitations, leg swelling, SOB, cough, abdominal pain, N/V.  Patient is a 60 y.o. female presenting with chest pain.  Chest Pain Associated symptoms: no abdominal pain, no cough, no fever, no nausea, no palpitations, no shortness of breath and not vomiting     Past Medical History  Diagnosis Date  . Hypertension    History reviewed. No pertinent past surgical history. History reviewed. No pertinent family history. Social History  Substance Use Topics  . Smoking status: Never Smoker   . Smokeless tobacco: None  . Alcohol Use: No   OB History    No data available     Review of Systems  Constitutional: Negative for fever.  Respiratory: Negative for cough and shortness of breath.   Cardiovascular: Positive for chest pain. Negative for palpitations and leg swelling.  Gastrointestinal: Negative for nausea, vomiting and abdominal pain.  All other systems reviewed and are negative.     Allergies  Review of patient's allergies indicates no known allergies.  Home Medications   Prior to Admission medications    Medication Sig Start Date End Date Taking? Authorizing Provider  aspirin EC 81 MG tablet Take 81 mg by mouth daily.   Yes Historical Provider, MD  Vitamin D, Ergocalciferol, (DRISDOL) 50000 UNITS CAPS capsule Take 50,000 Units by mouth every 7 (seven) days.   Yes Historical Provider, MD   BP 149/81 mmHg  Pulse 82  Temp(Src) 98.2 F (36.8 C) (Oral)  Resp 17  Ht  (1.702 m)  Wt 99.338 kg  BMI 34.29 kg/m2  SpO2 97% Physical Exam  Constitutional: She is oriented to person, place, and time. She appears well-developed and well-nourished. No distress.  HENT:  Head: Normocephalic and atraumatic.  Eyes: Conjunctivae are normal. Pupils are equal, round, and reactive to light. Right eye exhibits no discharge. Left eye exhibits no discharge. No scleral icterus.  Neck: Normal range of motion.  Cardiovascular: Normal rate and regular rhythm.  Exam reveals no gallop and no friction rub.   No murmur heard. Pulmonary/Chest: Effort normal and breath sounds normal. No respiratory distress. She has no wheezes. She has no rales. She exhibits tenderness.  Reproducible chest wall tenderness  Abdominal: Soft. Bowel sounds are normal. She exhibits no distension and no mass. There is no tenderness. There is no rebound and no guarding.  Musculoskeletal: She exhibits no edema.  Neurological: She is alert and oriented to person, place, and time.  Skin: Skin is warm and dry. She is not diaphoretic.  Psychiatric:  She has a normal mood and affect.    ED Course  Procedures (including critical care time) Labs Review Labs Reviewed  CBC - Abnormal; Notable for the following:    MCH 25.2 (*)    All other components within normal limits  BASIC METABOLIC PANEL  I-STAT TROPOININ, ED  Rosezena SensorI-STAT TROPOININ, ED    Imaging Review Dg Chest 2 View  12/18/2015  CLINICAL DATA:  Chest pain for 1 day EXAM: CHEST  2 VIEW COMPARISON:  Dec 13, 2013 FINDINGS: Lungs are clear. Heart is upper normal in size with pulmonary  vascularity within normal limits. No adenopathy. No bone lesions. No pneumothorax. IMPRESSION: No edema or consolidation. Electronically Signed   By: Bretta BangWilliam  Woodruff III M.D.   On: 12/18/2015 12:26   I have personally reviewed and evaluated these images and lab results as part of my medical decision-making.   EKG Interpretation   Date/Time:  Wednesday Dec 18 2015 11:59:40 EDT Ventricular Rate:  81 PR Interval:  122 QRS Duration: 80 QT Interval:  380 QTC Calculation: 441 R Axis:   47 Text Interpretation:  Normal sinus rhythm Normal ECG No significant change  since last tracing Confirmed by FLOYD MD, Reuel BoomANIEL ([id number]) on 12/18/2015  2:38:19 PM      MDM   Final diagnoses:  Chest pain, unspecified chest pain type   60 year old female presents with acute onset of chest pain. Most likely her symptoms are musculoskeletal as she had an acute onset with lifting. She also has a hx of GERD however this is not her typical reflux symptoms. She is afebrile, not tachycardic or tachypneic, and not hypoxic. She is hypertensive on arrival which has improved over the course of her ED stay. Chest pain work up is reassuring. EKG is NSR. CXR is negative. Initial troponin is 0 as well as repeat. Labs are unremarkable. No significant past or family hx of cardiac disease. Patient is non-smoker. HEART score is 2. Patient is NAD, non-toxic, with stable VS. Patient is informed of clinical course, understands medical decision making process, and agrees with plan. Opportunity for questions provided and all questions answered. Return precautions given. Bethel Born.       Taye Cato Marie Diesel Lina, PA-C 12/19/15 1728  Melene Planan Floyd, DO 12/20/15 2016

## 2016-01-01 ENCOUNTER — Other Ambulatory Visit: Payer: Self-pay

## 2016-01-01 ENCOUNTER — Ambulatory Visit: Payer: BLUE CROSS/BLUE SHIELD

## 2016-01-01 ENCOUNTER — Ambulatory Visit
Admission: RE | Admit: 2016-01-01 | Discharge: 2016-01-01 | Disposition: A | Discharge status: Home / Self Care | Payer: BLUE CROSS/BLUE SHIELD | Source: Ambulatory Visit

## 2016-01-01 DIAGNOSIS — Z1231 Encounter for screening mammogram for malignant neoplasm of breast: Secondary | ICD-10-CM

## 2016-02-17 IMAGING — MR MR HEAD WO/W CM
13 series · 48 of 48 positions shown · IV contrast (multihance)
Comparison: None.

CLINICAL DATA: Numbness and tingling left side of face for 4
months.

EXAM:
MRI HEAD WITHOUT AND WITH CONTRAST
TECHNIQUE: Multiplanar, multiecho pulse sequences of the brain and surrounding
structures were obtained without and with intravenous contrast.
CONTRAST:  20mL MULTIHANCE GADOBENATE DIMEGLUMINE 529 MG/ML IV SOLN

[Series 5: T1 · sagittal · 4.0mm · 0.75mm/px · 1 of 27 slices shown (1 of 3)]
[im 1/27]
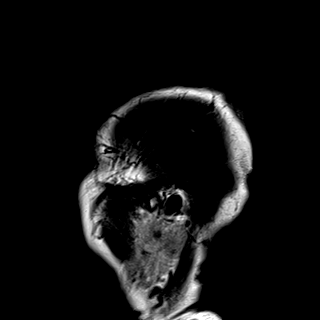

[Series 6: DWI · axial · 3.0mm · 1.44mm/px · z∈[-66,+69]mm · 5 of 84 slices shown (1 of 4)]
[im 1/84]
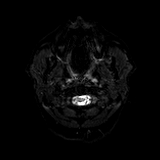
[im 21/84]
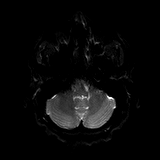
[im 42/84]
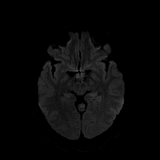
[im 63/84]
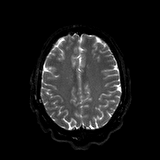
[im 84/84]
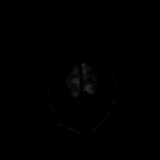

[Series 7: DWI · axial · 3.0mm · 1.44mm/px · z∈[-66,+69]mm · 3 of 41 slices shown (2 of 4)]
[im 1/41]
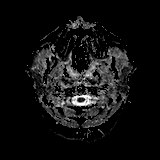
[im 21/41]
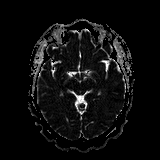
[im 41/41]
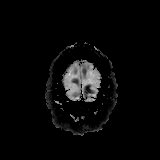

[Series 8: DWI · coronal · 5.0mm · 1.44mm/px · 4 of 56 slices shown (3 of 4)]
[im 1/56]
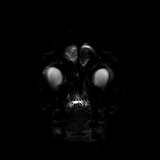
[im 19/56]
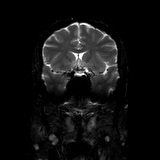
[im 37/56]
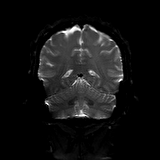
[im 56/56]
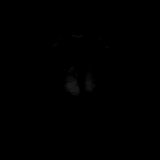

[Series 9: DWI · coronal · 5.0mm · 1.44mm/px · 2 of 28 slices shown (4 of 4)]
[im 1/28]
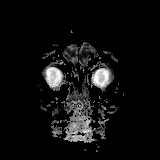
[im 28/28]
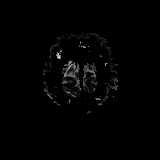

[Series 10: T2 · axial · 4.0mm · 0.36mm/px · z∈[-65,+70]mm · 2 of 27 slices shown]
[im 1/27]
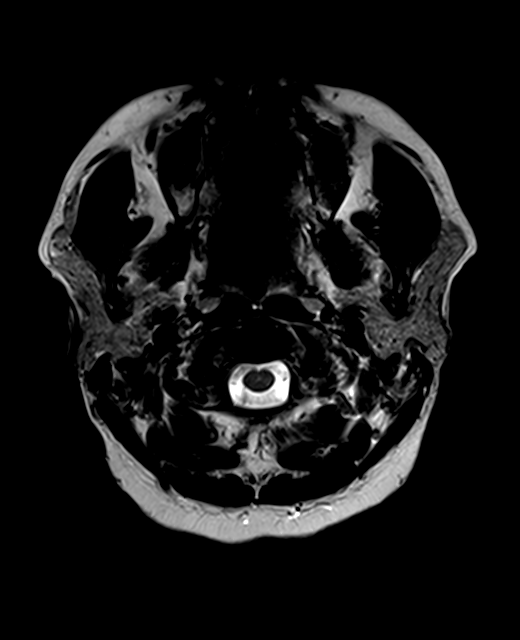
[im 27/27]
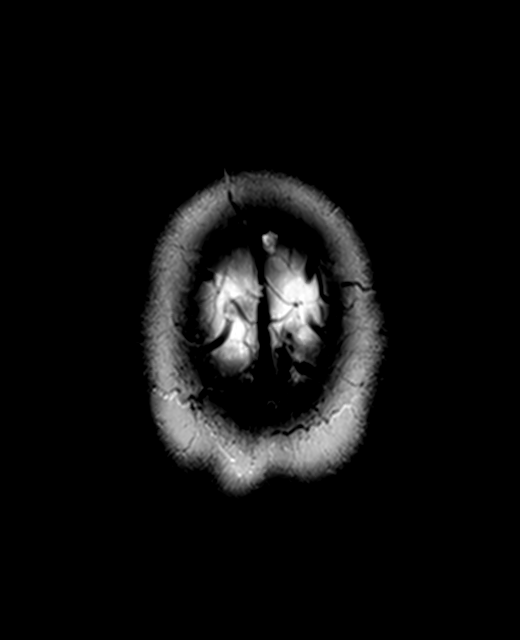

[Series 11: FLAIR · sagittal · 4.0mm · 0.72mm/px · 2 of 27 slices shown (1 of 2)]
[im 1/27]
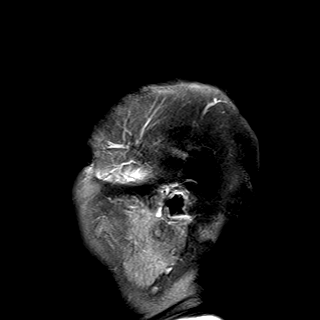
[im 27/27]
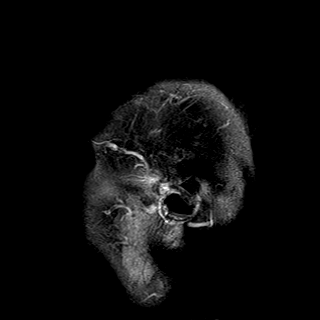

[Series 12: FLAIR · axial · 4.0mm · 0.72mm/px · z∈[-65,+70]mm · 2 of 27 slices shown (2 of 2)]
[im 1/27]
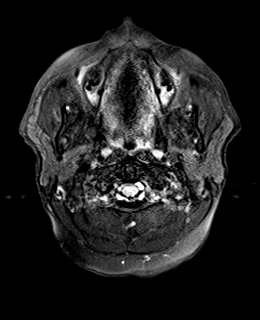
[im 27/27]
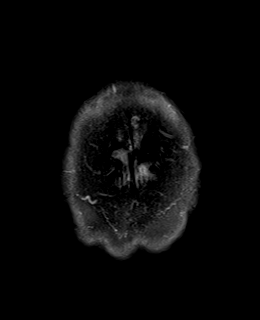

[Series 14: swi_images · axial · 2.0mm · 0.90mm/px · z∈[-68,+74]mm · 5 of 72 slices shown]
[im 1/72]
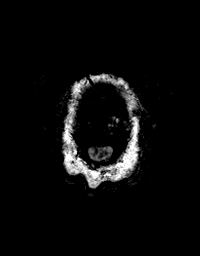
[im 18/72]
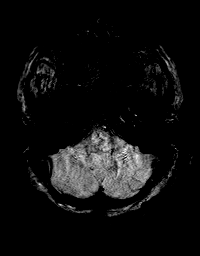
[im 36/72]
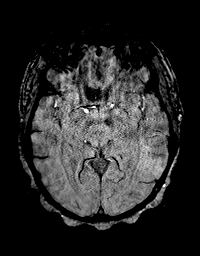
[im 54/72]
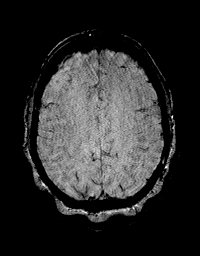
[im 72/72]
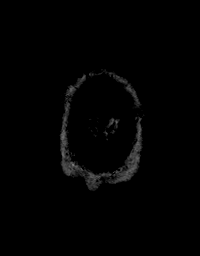

[Series 15: T1 · axial · 1.0mm · 0.90mm/px · z∈[-69,+74]mm · 9 of 144 slices shown (2 of 3)]
[im 1/144]
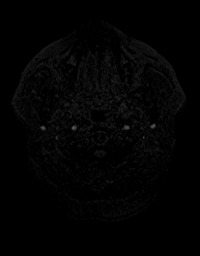
[im 18/144]
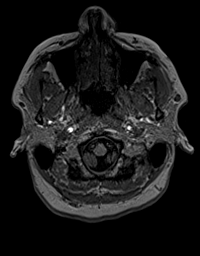
[im 36/144]
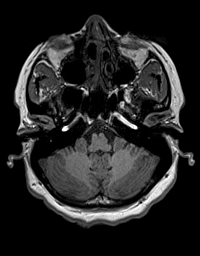
[im 54/144]
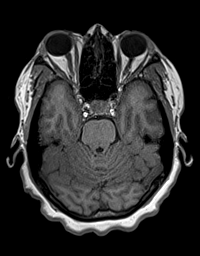
[im 72/144]
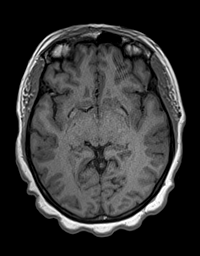
[im 90/144]
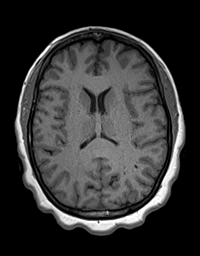
[im 108/144]
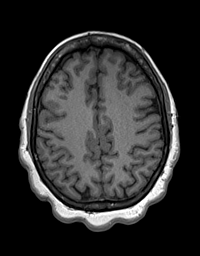
[im 126/144]
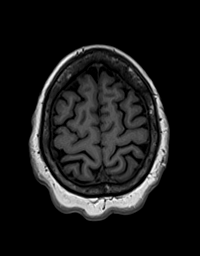
[im 144/144]
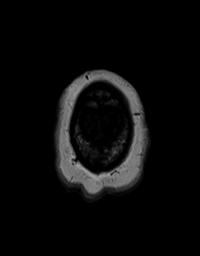

[Series 16: T2 post-contrast · coronal · 4.5mm · 0.36mm/px · 2 of 26 slices shown]
[im 1/26]
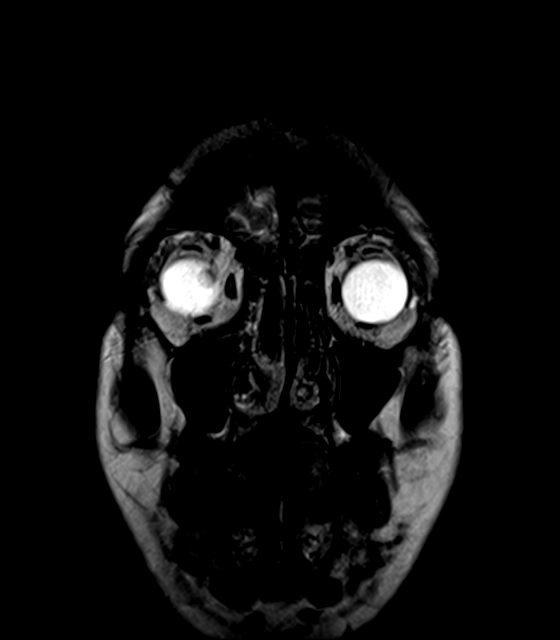
[im 26/26]
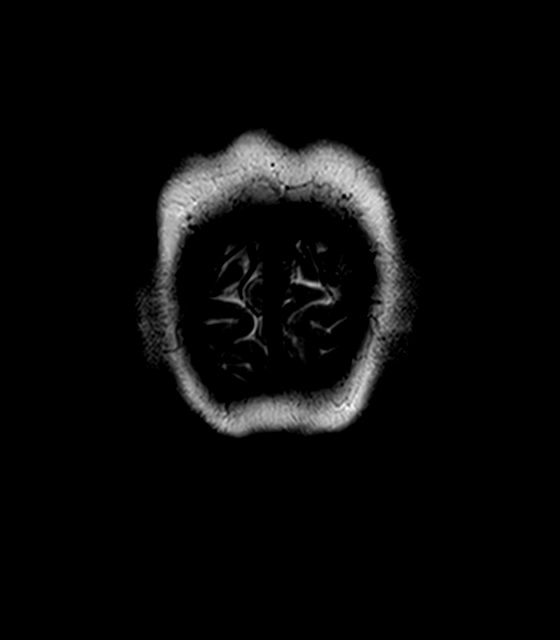

[Series 17: T1 · axial · 1.0mm · 0.90mm/px · z∈[-69,+74]mm · 9 of 144 slices shown (3 of 3)]
[im 1/144]
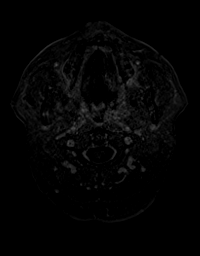
[im 18/144]
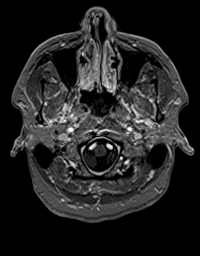
[im 36/144]
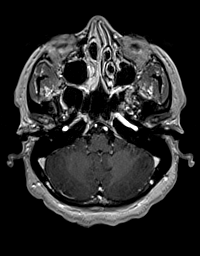
[im 54/144]
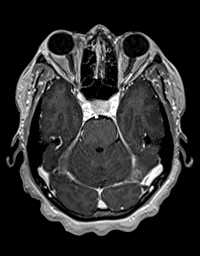
[im 72/144]
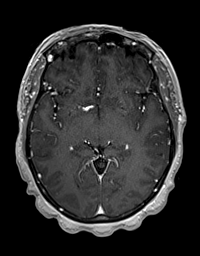
[im 90/144]
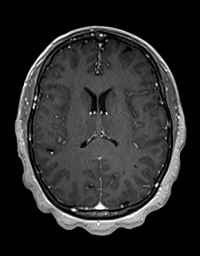
[im 108/144]
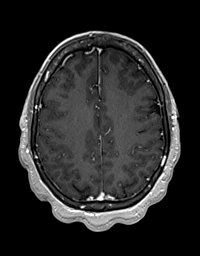
[im 126/144]
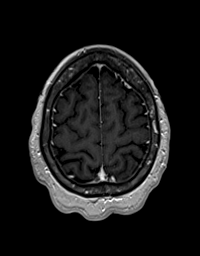
[im 144/144]
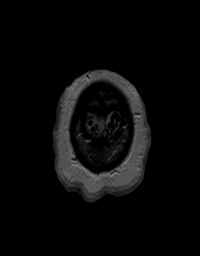

[Series 18: T1 post-contrast · coronal · 4.5mm · 0.72mm/px · 2 of 26 slices shown]
[im 1/26]
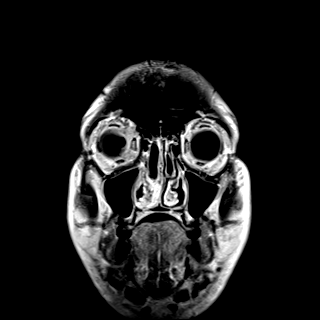
[im 26/26]
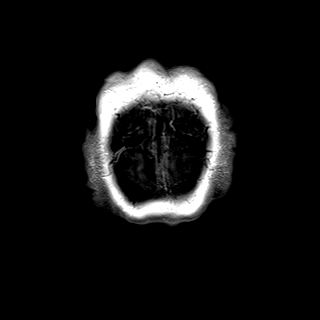

[48 of 48 positions shown; findings below may reference images not displayed]

FINDINGS: Calvarium and upper cervical spine: No focal marrow signal
abnormality.

Orbits: Negative.

Sinuses and Mastoids: Concha bullosa, right more than left. No
sinusitis or mastoiditis.

Brain: No explanation for facial numbness, including cortical
lesion, previous infarct, neuritis or mass lesion, or sinusitis.
There is no asymmetric signal or enhancement in the muscles of
mastication. The skullbase foramina have a symmetric appearance.

No previous infarct, hemorrhage, hydrocephalus, or mass lesion. No
evidence of large vessel occlusion.

Small and incidental parasagittal right frontal parietal
developmental venous anomaly.
IMPRESSION: Negative brain MRI.  No explanation for numbness.

## 2016-04-16 ENCOUNTER — Other Ambulatory Visit: Payer: Self-pay | Admitting: Registered Nurse

## 2016-04-16 ENCOUNTER — Other Ambulatory Visit (HOSPITAL_COMMUNITY)
Admission: RE | Admit: 2016-04-16 | Discharge: 2016-04-16 | Disposition: A | Discharge status: Home / Self Care | Payer: BLUE CROSS/BLUE SHIELD | Source: Ambulatory Visit | Attending: Internal Medicine | Admitting: Internal Medicine

## 2016-04-16 DIAGNOSIS — Z01419 Encounter for gynecological examination (general) (routine) without abnormal findings: Secondary | ICD-10-CM | POA: Diagnosis present

## 2016-04-21 LAB — CYTOLOGY - PAP

## 2017-02-26 ENCOUNTER — Other Ambulatory Visit: Payer: Self-pay | Admitting: Internal Medicine

## 2017-02-26 DIAGNOSIS — Z1231 Encounter for screening mammogram for malignant neoplasm of breast: Secondary | ICD-10-CM

## 2017-03-12 ENCOUNTER — Ambulatory Visit
Admission: RE | Admit: 2017-03-12 | Discharge: 2017-03-12 | Disposition: A | Discharge status: Home / Self Care | Payer: PRIVATE HEALTH INSURANCE | Source: Ambulatory Visit | Attending: Internal Medicine | Admitting: Internal Medicine

## 2017-03-12 DIAGNOSIS — Z1231 Encounter for screening mammogram for malignant neoplasm of breast: Secondary | ICD-10-CM

## 2018-01-11 ENCOUNTER — Observation Stay (HOSPITAL_COMMUNITY)
Admission: EM | Admit: 2018-01-11 | Discharge: 2018-01-12 | Disposition: A | Discharge status: Home / Self Care | Payer: Self-pay | Attending: Family Medicine | Admitting: Family Medicine

## 2018-01-11 ENCOUNTER — Other Ambulatory Visit: Payer: Self-pay

## 2018-01-11 ENCOUNTER — Encounter (HOSPITAL_COMMUNITY): Payer: Self-pay | Admitting: Emergency Medicine

## 2018-01-11 DIAGNOSIS — I83899 Varicose veins of unspecified lower extremities with other complications: Secondary | ICD-10-CM

## 2018-01-11 DIAGNOSIS — I959 Hypotension, unspecified: Secondary | ICD-10-CM

## 2018-01-11 DIAGNOSIS — Z79899 Other long term (current) drug therapy: Secondary | ICD-10-CM | POA: Insufficient documentation

## 2018-01-11 DIAGNOSIS — I83891 Varicose veins of right lower extremities with other complications: Secondary | ICD-10-CM | POA: Insufficient documentation

## 2018-01-11 DIAGNOSIS — Z7982 Long term (current) use of aspirin: Secondary | ICD-10-CM | POA: Insufficient documentation

## 2018-01-11 DIAGNOSIS — I10 Essential (primary) hypertension: Secondary | ICD-10-CM | POA: Insufficient documentation

## 2018-01-11 DIAGNOSIS — K219 Gastro-esophageal reflux disease without esophagitis: Secondary | ICD-10-CM | POA: Insufficient documentation

## 2018-01-11 DIAGNOSIS — D62 Acute posthemorrhagic anemia: Principal | ICD-10-CM | POA: Insufficient documentation

## 2018-01-11 LAB — CBC WITH DIFFERENTIAL/PLATELET
Abs Immature Granulocytes: 0 10*3/uL (ref 0.0–0.1)
BASOS ABS: 0 10*3/uL (ref 0.0–0.1)
BASOS PCT: 0 %
EOS ABS: 0.1 10*3/uL (ref 0.0–0.7)
EOS PCT: 2 %
HCT: 33.7 % — ABNORMAL LOW (ref 36.0–46.0)
Hemoglobin: 10.1 g/dL — ABNORMAL LOW (ref 12.0–15.0)
IMMATURE GRANULOCYTES: 0 %
LYMPHS ABS: 1.7 10*3/uL (ref 0.7–4.0)
Lymphocytes Relative: 29 %
MCH: 24.4 pg — ABNORMAL LOW (ref 26.0–34.0)
MCHC: 30 g/dL (ref 30.0–36.0)
MCV: 81.4 fL (ref 78.0–100.0)
Monocytes Absolute: 0.6 10*3/uL (ref 0.1–1.0)
Monocytes Relative: 9 %
NEUTROS PCT: 60 %
Neutro Abs: 3.5 10*3/uL (ref 1.7–7.7)
PLATELETS: 298 10*3/uL (ref 150–400)
RBC: 4.14 MIL/uL (ref 3.87–5.11)
RDW: 14.2 % (ref 11.5–15.5)
WBC: 5.9 10*3/uL (ref 4.0–10.5)

## 2018-01-11 LAB — BASIC METABOLIC PANEL
Anion gap: 9 (ref 5–15)
BUN: 16 mg/dL (ref 6–20)
CHLORIDE: 107 mmol/L (ref 101–111)
CO2: 23 mmol/L (ref 22–32)
CREATININE: 0.9 mg/dL (ref 0.44–1.00)
Calcium: 8.4 mg/dL — ABNORMAL LOW (ref 8.9–10.3)
GFR calc non Af Amer: >60 mL/min (ref >60)
Glucose, Bld: 126 mg/dL — ABNORMAL HIGH (ref 65–99)
Potassium: 4.1 mmol/L (ref 3.5–5.1)
SODIUM: 139 mmol/L (ref 135–145)

## 2018-01-11 LAB — I-STAT CHEM 8, ED
BUN: 20 mg/dL (ref 6–20)
CALCIUM ION: 1.09 mmol/L — AB (ref 1.15–1.40)
CREATININE: 0.8 mg/dL (ref 0.44–1.00)
Chloride: 105 mmol/L (ref 101–111)
GLUCOSE: 126 mg/dL — AB (ref 65–99)
HCT: 33 % — ABNORMAL LOW (ref 36.0–46.0)
HEMOGLOBIN: 11.2 g/dL — AB (ref 12.0–15.0)
Potassium: 4.1 mmol/L (ref 3.5–5.1)
Sodium: 141 mmol/L (ref 135–145)
TCO2: 23 mmol/L (ref 22–32)

## 2018-01-11 LAB — TYPE AND SCREEN
ABO/RH(D): O POS
ANTIBODY SCREEN: NEGATIVE

## 2018-01-11 LAB — ABO/RH: ABO/RH(D): O POS

## 2018-01-11 NOTE — ED Triage Notes (Signed)
Pt arrived per EMS, from home, pt was showering when her varicose vein in right lower leg. Per EMS not on blood thinners, does take 81mg  of aspirin. Also per EMS about 1500mL blood loss, 350 NS given, 4mg  Zofran given.  BP 76/50  IV Left AC

## 2018-01-11 NOTE — ED Provider Notes (Signed)
[facility] Provider Note   CSN: [id number] Arrival date & time: 01/11/18  2057     History   Chief Complaint Chief Complaint  Patient presents with  . Leg Injury    HPI Amy Beard is a 62 y.o. female.  Patient is a 62 year old female with a history of hypertension who presents with bleeding from her leg.  She had a small varicose vein on her right leg that started bleeding when she got home from work.  She did not have any trauma to the area.  She states it was a little pinhole that was squirting blood across the room.  She states it bled for about 20 minutes.  EMS was called and was able to put pressure on the wound and stop the bleeding.  Her blood pressure was noted to be 70 systolic on scene.  Patient is feeling very tired and fatigued.  She had a little bit of shortness of breath which has improved.  She was feeling dizzy and lightheaded and this also has improved.  She is not on anticoagulants.  No history of similar symptoms in the past.     Past Medical History:  Diagnosis Date  . Hypertension     There are no active problems to display for this patient.   History reviewed. No pertinent surgical history.   OB History   None      Home Medications    Prior to Admission medications   Medication Sig Start Date End Date Taking? Authorizing Provider  acetaminophen (TYLENOL) 500 MG tablet Take 500 mg by mouth every 6 (six) hours as needed for mild pain, moderate pain, fever or headache.   Yes [provider]  albuterol (PROVENTIL HFA;VENTOLIN HFA) 108 (90 Base) MCG/ACT inhaler Inhale 1-2 puffs into the lungs every 6 (six) hours as needed for wheezing or shortness of breath.   Yes [provider]  aspirin EC 81 MG tablet Take 81 mg by mouth daily.   Yes [provider]  esomeprazole (NEXIUM) 20 MG capsule Take 20 mg by mouth daily at 12 noon.   Yes [provider]  Vitamin D,  Ergocalciferol, (DRISDOL) 50000 UNITS CAPS capsule Take 50,000 Units by mouth every 7 (seven) days. Take every Sunday   Yes [provider]    Family History History reviewed. No pertinent family history.  Social History Social History   Tobacco Use  . Smoking status: Never Smoker  . Smokeless tobacco: Never Used  Substance Use Topics  . Alcohol use: No  . Drug use: No     Allergies   Patient has no known allergies.   Review of Systems Review of Systems  Constitutional: Positive for fatigue. Negative for chills, diaphoresis and fever.  HENT: Negative for congestion, rhinorrhea and sneezing.   Eyes: Negative.   Respiratory: Positive for shortness of breath. Negative for cough and chest tightness.   Cardiovascular: Negative for chest pain and leg swelling.  Gastrointestinal: Negative for abdominal pain, blood in stool, diarrhea, nausea and vomiting.  Genitourinary: Negative for difficulty urinating, flank pain, frequency and hematuria.  Musculoskeletal: Negative for arthralgias and back pain.  Skin: Positive for wound. Negative for rash.  Neurological: Positive for light-headedness. Negative for dizziness, speech difficulty, weakness, numbness and headaches.     Physical Exam Updated Vital Signs BP 104/77   Pulse 83   Resp 16   Ht 5\' 7"  (1.702 m)   Wt 89.8 kg (198 lb)  SpO2 100%   BMI 31.01 kg/m   Physical Exam  Constitutional: She is oriented to person, place, and time. She appears well-developed and well-nourished.  HENT:  Head: Normocephalic and atraumatic.  Eyes: Pupils are equal, round, and reactive to light.  Neck: Normal range of motion. Neck supple.  Cardiovascular: Normal rate, regular rhythm and normal heart sounds.  Pulmonary/Chest: Effort normal and breath sounds normal. No respiratory distress. She has no wheezes. She has no rales. She exhibits no tenderness.  Abdominal: Soft. Bowel sounds are normal. There is no tenderness. There is no  rebound and no guarding.  Musculoskeletal: Normal range of motion. She exhibits no edema.  Patient has a small pinpoint bleeding varicose vein to her right lower leg.  Bleeding is controlled with direct pressure.  There is no leg swelling.  Pedal pulses are intact.  Lymphadenopathy:    She has no cervical adenopathy.  Neurological: She is alert and oriented to person, place, and time.  Skin: Skin is warm and dry. No rash noted.  Psychiatric: She has a normal mood and affect.     ED Treatments / Results  Labs (all labs ordered are listed, but only abnormal results are displayed) Labs Reviewed  BASIC METABOLIC PANEL - Abnormal; Notable for the following components:      Result Value   Glucose, Bld 126 (*)    Calcium 8.4 (*)    All other components within normal limits  CBC WITH DIFFERENTIAL/PLATELET - Abnormal; Notable for the following components:   Hemoglobin 10.1 (*)    HCT 33.7 (*)    MCH 24.4 (*)    All other components within normal limits  I-STAT CHEM 8, ED - Abnormal; Notable for the following components:   Glucose, Bld 126 (*)    Calcium, Ion 1.09 (*)    Hemoglobin 11.2 (*)    HCT 33.0 (*)    All other components within normal limits  TYPE AND SCREEN  ABO/RH    EKG EKG Interpretation  Date/Time:  Tuesday January 11 2018 22:21:37 EDT Ventricular Rate:  77 PR Interval:    QRS Duration: 93 QT Interval:  393 QTC Calculation: 445 R Axis:   59 Text Interpretation:  Sinus rhythm No old tracing to compare Confirmed by Rolan Bucco [phone]) on 01/11/2018 10:38:00 PM   Radiology No results found.  Procedures Procedures (including critical care time)  Medications Ordered in ED Medications - No data to display   Initial Impression / Assessment and Plan / ED Course  I have reviewed the triage vital signs and the nursing notes.  Pertinent labs & imaging results that were available during my care of the patient were reviewed by me and considered in my medical  decision making (see chart for details).     Patient is a 62 year old female who presents with bleeding from her varicose vein.  Reportedly there is a large amount of blood loss on scene.  Bleeding is controlled in the ED with quick clot and a dressing.  Her hemoglobin is 10.1.  This is a drop from her last hemoglobin.  She was hypotensive on arrival with blood pressure in the 80s.  She improved after 2 L IV fluids.  I felt that she warranted observation overnight to make sure that she maintained stability.  I spoke with Dr. [name] who will admit the patient.  Final Clinical Impressions(s) / ED Diagnoses   Final diagnoses:  Bleeding from varicose vein  Hypotension, unspecified hypotension type    ED  Discharge Orders    None       Rolan Bucco, MD 01/11/18 [phone]

## 2018-01-12 ENCOUNTER — Encounter (HOSPITAL_COMMUNITY): Payer: Self-pay | Admitting: Internal Medicine

## 2018-01-12 DIAGNOSIS — D62 Acute posthemorrhagic anemia: Secondary | ICD-10-CM | POA: Diagnosis present

## 2018-01-12 DIAGNOSIS — I83899 Varicose veins of unspecified lower extremities with other complications: Secondary | ICD-10-CM | POA: Insufficient documentation

## 2018-01-12 LAB — CBC WITH DIFFERENTIAL/PLATELET
ABS IMMATURE GRANULOCYTES: 0 10*3/uL (ref 0.0–0.1)
Abs Immature Granulocytes: 0 10*3/uL (ref 0.0–0.1)
BASOS PCT: 0 %
BASOS PCT: 0 %
Basophils Absolute: 0 10*3/uL (ref 0.0–0.1)
Basophils Absolute: 0 10*3/uL (ref 0.0–0.1)
EOS ABS: 0.1 10*3/uL (ref 0.0–0.7)
EOS PCT: 1 %
Eosinophils Absolute: 0.1 10*3/uL (ref 0.0–0.7)
Eosinophils Relative: 1 %
HCT: 31.1 % — ABNORMAL LOW (ref 36.0–46.0)
HEMATOCRIT: 30.6 % — AB (ref 36.0–46.0)
HEMOGLOBIN: 9.5 g/dL — AB (ref 12.0–15.0)
Hemoglobin: 9.2 g/dL — ABNORMAL LOW (ref 12.0–15.0)
IMMATURE GRANULOCYTES: 0 %
Immature Granulocytes: 0 %
LYMPHS ABS: 1.1 10*3/uL (ref 0.7–4.0)
Lymphocytes Relative: 16 %
Lymphocytes Relative: 26 %
Lymphs Abs: 1.4 10*3/uL (ref 0.7–4.0)
MCH: 24.7 pg — ABNORMAL LOW (ref 26.0–34.0)
MCH: 24.5 pg — AB (ref 26.0–34.0)
MCHC: 30.1 g/dL (ref 30.0–36.0)
MCHC: 30.5 g/dL (ref 30.0–36.0)
MCV: 82 fL (ref 78.0–100.0)
MCV: 80.4 fL (ref 78.0–100.0)
MONO ABS: 0.6 10*3/uL (ref 0.1–1.0)
MONOS PCT: 8 %
MONOS PCT: 11 %
Monocytes Absolute: 0.6 10*3/uL (ref 0.1–1.0)
NEUTROS ABS: 3.3 10*3/uL (ref 1.7–7.7)
NEUTROS PCT: 62 %
Neutro Abs: 5.2 10*3/uL (ref 1.7–7.7)
Neutrophils Relative %: 75 %
PLATELETS: 280 10*3/uL (ref 150–400)
Platelets: 260 10*3/uL (ref 150–400)
RBC: 3.73 MIL/uL — ABNORMAL LOW (ref 3.87–5.11)
RBC: 3.87 MIL/uL (ref 3.87–5.11)
RDW: 14.4 % (ref 11.5–15.5)
RDW: 14.1 % (ref 11.5–15.5)
WBC: 6.9 10*3/uL (ref 4.0–10.5)
WBC: 5.4 10*3/uL (ref 4.0–10.5)

## 2018-01-12 LAB — PROTIME-INR
INR: 0.99
Prothrombin Time: 13 seconds (ref 11.4–15.2)

## 2018-01-12 LAB — BASIC METABOLIC PANEL
Anion gap: 6 (ref 5–15)
BUN: 10 mg/dL (ref 6–20)
CHLORIDE: 109 mmol/L (ref 101–111)
CO2: 25 mmol/L (ref 22–32)
CREATININE: 0.52 mg/dL (ref 0.44–1.00)
Calcium: 8.5 mg/dL — ABNORMAL LOW (ref 8.9–10.3)
Glucose, Bld: 99 mg/dL (ref 65–99)
POTASSIUM: 4.1 mmol/L (ref 3.5–5.1)
SODIUM: 140 mmol/L (ref 135–145)

## 2018-01-12 LAB — VITAMIN B12: VITAMIN B 12: 144 pg/mL — AB (ref 180–914)

## 2018-01-12 LAB — FOLATE: Folate: 20.7 ng/mL (ref >5.9)

## 2018-01-12 LAB — IRON AND TIBC
Iron: 37 ug/dL (ref 28–170)
Saturation Ratios: 13 % (ref 10.4–31.8)
TIBC: 279 ug/dL (ref 250–450)
UIBC: 242 ug/dL

## 2018-01-12 LAB — FERRITIN: FERRITIN: 62 ng/mL (ref 11–307)

## 2018-01-12 LAB — RETICULOCYTES
RBC.: 3.73 MIL/uL — ABNORMAL LOW (ref 3.87–5.11)
Retic Count, Absolute: 59.7 10*3/uL (ref 19.0–186.0)
Retic Ct Pct: 1.6 % (ref 0.4–3.1)

## 2018-01-12 LAB — HIV ANTIBODY (ROUTINE TESTING W REFLEX): HIV Screen 4th Generation wRfx: NONREACTIVE

## 2018-01-12 MED ORDER — ZOLPIDEM TARTRATE 5 MG PO TABS: 5.0000 mg | ORAL_TABLET | Freq: Once | ORAL | Status: DC

## 2018-01-12 MED ORDER — ONDANSETRON HCL 4 MG PO TABS: 4.0000 mg | ORAL_TABLET | Freq: Four times a day (QID) | ORAL | Status: DC | PRN

## 2018-01-12 MED ORDER — PANTOPRAZOLE SODIUM 40 MG PO TBEC
40.0000 mg | DELAYED_RELEASE_TABLET | Freq: Every day | ORAL | Status: DC
  Administered 2018-01-12: 40 mg via ORAL
  Filled 2018-01-12: qty 1

## 2018-01-12 MED ORDER — ACETAMINOPHEN 325 MG PO TABS: 650.0000 mg | ORAL_TABLET | Freq: Four times a day (QID) | ORAL | Status: DC | PRN

## 2018-01-12 MED ORDER — PANTOPRAZOLE SODIUM 40 MG PO TBEC: 40.0000 mg | DELAYED_RELEASE_TABLET | Freq: Two times a day (BID) | ORAL | Status: DC

## 2018-01-12 MED ORDER — FERROUS SULFATE 325 (65 FE) MG PO TABS: ORAL_TABLET | ORAL | 0 refills | Status: DC

## 2018-01-12 MED ORDER — ONDANSETRON HCL 4 MG/2ML IJ SOLN: 4.0000 mg | Freq: Four times a day (QID) | INTRAMUSCULAR | Status: DC | PRN

## 2018-01-12 MED ORDER — ACETAMINOPHEN 650 MG RE SUPP: 650.0000 mg | Freq: Four times a day (QID) | RECTAL | Status: DC | PRN

## 2018-01-12 MED ORDER — ALBUTEROL SULFATE (2.5 MG/3ML) 0.083% IN NEBU: 2.5000 mg | INHALATION_SOLUTION | Freq: Four times a day (QID) | RESPIRATORY_TRACT | Status: DC | PRN

## 2018-01-12 NOTE — ED Notes (Signed)
Discharge instructions given and reviewed - verbalized understanding - all questions answered

## 2018-01-12 NOTE — H&P (Signed)
History and Physical    Amy Beard ZOX:[id number] DOB: 03/03/1956 DOA: 01/11/2018  PCP: Pearson Grippe, MD  Patient coming from: Home.  Chief Complaint: Bleeding from the leg.  HPI: Amy Beard is a 62 y.o. female with history of GERD started noticing blood oozing from her leg last evening after returning from work.  Patient denies any trauma.  Patient had a small varicose vein on the right leg which was oozing blood.  Blood was continuously using for almost 20 minutes and had lost large amount of blood.  EMS was contacted and was advised to place pressure on the leg.  EMS on arrival found her blood pressure was around 70 systolic.  Patient also was mildly dizzy and shortness of breath.  Bleeding got improved with pressure.  ED Course: In the ER patient was placed on a quick clot following which bleeding was arrested.  Since patient was symptomatic with bleeding and hypotensive initially patient admitted for further observation to make sure there is no further bleed and also to follow CBC.  Patient states over the last few days patient has been having some concerns for numbness in the peripheries.  Review of Systems: As per HPI, rest all negative.   Past Medical History:  Diagnosis Date  . Hypertension     History reviewed. No pertinent surgical history.   reports that she has never smoked. She has never used smokeless tobacco. She reports that she does not drink alcohol or use drugs.  No Known Allergies  Family History  Problem Relation Age of Onset  . Diabetes Mellitus II Mother   . Diabetes Mellitus II Father     Prior to Admission medications   Medication Sig Start Date End Date Taking? Authorizing Provider  acetaminophen (TYLENOL) 500 MG tablet Take 500 mg by mouth every 6 (six) hours as needed for mild pain, moderate pain, fever or headache.   Yes [provider]  albuterol (PROVENTIL HFA;VENTOLIN HFA) 108 (90 Base) MCG/ACT inhaler Inhale 1-2 puffs into the  lungs every 6 (six) hours as needed for wheezing or shortness of breath.   Yes [provider]  aspirin EC 81 MG tablet Take 81 mg by mouth daily.   Yes [provider]  esomeprazole (NEXIUM) 20 MG capsule Take 20 mg by mouth daily at 12 noon.   Yes [provider]  Vitamin D, Ergocalciferol, (DRISDOL) 50000 UNITS CAPS capsule Take 50,000 Units by mouth every 7 (seven) days. Take every Sunday   Yes [provider]    Physical Exam: Vitals:   01/11/18 2315 01/11/18 2330 01/11/18 2345 01/12/18 0030  BP: 111/72 114/68 110/69 124/79  Pulse: 81 81 88 85  Resp: 19 16 20 16   SpO2: 100% 100% 98% 100%  Weight:      Height:          Constitutional: Moderately built and nourished. Vitals:   01/11/18 2315 01/11/18 2330 01/11/18 2345 01/12/18 0030  BP: 111/72 114/68 110/69 124/79  Pulse: 81 81 88 85  Resp: 19 16 20 16   SpO2: 100% 100% 98% 100%  Weight:      Height:       Eyes: Anicteric no pallor. ENMT: No discharge from the ears eyes nose or mouth. Neck: No mass felt.  No neck rigidity.  No JVD appreciated. Respiratory: No rhonchi or crepitations. Cardiovascular: S1-S2 heard no murmurs appreciated. Abdomen: Soft nontender bowel sounds present. Musculoskeletal: No edema.  No joint effusion.  Right leg is in  the dressing. Skin: No rash. Neurologic: Alert awake oriented to time place and person.  Moves all extremities. Psychiatric: Appears normal per normal affect.   Labs on Admission: I have personally reviewed following labs and imaging studies  CBC: Recent Labs  Lab 01/11/18 2111 01/11/18 2121  WBC 5.9  --   NEUTROABS 3.5  --   HGB 10.1* 11.2*  HCT 33.7* 33.0*  MCV 81.4  --   PLT 298  --    Basic Metabolic Panel: Recent Labs  Lab 01/11/18 2111 01/11/18 2121  NA 139 141  K 4.1 4.1  CL 107 105  CO2 23  --   GLUCOSE 126* 126*  BUN 16 20  CREATININE 0.90 0.80  CALCIUM 8.4*  --    GFR: Estimated Creatinine Clearance: 86.1 mL/min  (by C-G formula based on SCr of 0.8 mg/dL). Liver Function Tests: No results for input(s): AST, ALT, ALKPHOS, BILITOT, PROT, ALBUMIN in the last 168 hours. No results for input(s): LIPASE, AMYLASE in the last 168 hours. No results for input(s): AMMONIA in the last 168 hours. Coagulation Profile: No results for input(s): INR, PROTIME in the last 168 hours. Cardiac Enzymes: No results for input(s): CKTOTAL, CKMB, CKMBINDEX, TROPONINI in the last 168 hours. BNP (last 3 results) No results for input(s): PROBNP in the last 8760 hours. HbA1C: No results for input(s): HGBA1C in the last 72 hours. CBG: No results for input(s): GLUCAP in the last 168 hours. Lipid Profile: No results for input(s): CHOL, HDL, LDLCALC, TRIG, CHOLHDL, LDLDIRECT in the last 72 hours. Thyroid Function Tests: No results for input(s): TSH, T4TOTAL, FREET4, T3FREE, THYROIDAB in the last 72 hours. Anemia Panel: No results for input(s): VITAMINB12, FOLATE, FERRITIN, TIBC, IRON, RETICCTPCT in the last 72 hours. Urine analysis:    Component Value Date/Time   COLORURINE YELLOW 05/09/2011 2158   APPEARANCEUR CLOUDY (A) 05/09/2011 2158   LABSPEC 1.008 05/09/2011 2158   PHURINE 8.0 05/09/2011 2158   GLUCOSEU NEGATIVE 05/09/2011 2158   HGBUR TRACE (A) 05/09/2011 2158   BILIRUBINUR NEGATIVE 05/09/2011 2158   KETONESUR NEGATIVE 05/09/2011 2158   PROTEINUR NEGATIVE 05/09/2011 2158   UROBILINOGEN 0.2 05/09/2011 2158   NITRITE NEGATIVE 05/09/2011 2158   LEUKOCYTESUR NEGATIVE 05/09/2011 2158   Sepsis Labs: @LABRCNTIP (procalcitonin:4,lacticidven:4) )No results found for this or any previous visit (from the past 240 hour(s)).   Radiological Exams on Admission: No results found.    Assessment/Plan Principal Problem:   Acute blood loss anemia    1. Acute blood loss anemia secondary to bleed from likely varicose veins of the right leg -bleeding presently arrested after placement of acute clot.  Closely observe overnight  and follow CBC.  Patient takes aspirin which can be discontinued for now.  May discuss with vascular surgeon or refer as outpatient.  Since patient states she has some peripheral numbness which has been ongoing for last many days will check anemia panel.  Pulses are intact.  No signs of ischemia. 2. History of GERD on PPI.   DVT prophylaxis: SCDs. Code Status: Full code. Family Communication: Family at the bedside. Disposition Plan: Home. Consults called: None. Admission status: Observation.   Eduard ClosArshad N Shalin Linders MD [facility] Pager [phone].  If 7PM-7AM, please contact night-coverage [email or web address] Password [email or web address]  01/12/2018, 1:27 AM

## 2018-01-12 NOTE — ED Notes (Signed)
Hospitalist [facility]([place]) in to assess and discuss plan of care with pt; dressing to RLE removed per MD - no active bleeding noted at this time - instructed to place thin pressure dressing on area and then apply TED hose; plan is to await CBC results - if H&H stable then pt to be d/c'd home - pt verbalized understanding

## 2018-01-12 NOTE — Discharge Summary (Signed)
Amy Beard, is a 62 y.o. female  DOB Jun 05, 1956  MRN [id number].  Admission date:  01/11/2018  Admitting Physician  No admitting provider for patient encounter.  Discharge Date:  01/12/2018   Primary MD  Pearson Grippe, MD  Recommendations for primary care physician for things to follow:   1)Avoid ibuprofen/Advil/Aleve/Motrin/Goody Powders/Naproxen/BC powders/Meloxicam/Diclofenac/Indomethacin and other Nonsteroidal anti-inflammatory medications as these will make you more likely to bleed and can cause stomach ulcers, can also cause Kidney problems.   2) take iron pills twice a day for 1 month to help your blood count, please note that your stool will become dark while you are taking iron pills  3) avoid injury to the varicose veins on your legs  4) use compressive TED stockings as advised, put them on every morning and take them off at bedtime  5) follow-up with your primary care doctor in 1 to 2 weeks for recheck, this repeat his CBC/complete blood count at that time   Admission Diagnosis  Bleeding    Discharge Diagnosis  Bleeding     Principal Problem:   Acute blood loss anemia      Past Medical History:  Diagnosis Date  . Hypertension     History reviewed. No pertinent surgical history.     HPI  from the history and physical done on the day of admission:    Patient coming from: Home.  Chief Complaint: Bleeding from the leg.  HPI: Amy Beard is a 62 y.o. female with history of GERD started noticing blood oozing from her leg last evening after returning from work.  Patient denies any trauma.  Patient had a small varicose vein on the right leg which was oozing blood.  Blood was continuously using for almost 20 minutes and had lost large amount of blood.  EMS was contacted and was advised to place pressure on the leg.  EMS on arrival found her blood pressure was around 70 systolic.   Patient also was mildly dizzy and shortness of breath.  Bleeding got improved with pressure.  ED Course: In the ER patient was placed on a quick clot following which bleeding was arrested.  Since patient was symptomatic with bleeding and hypotensive initially patient admitted for further observation to make sure there is no further bleed and also to follow CBC.  Patient states over the last few days patient has been having some concerns for numbness in the peripheries.       Hospital Course:     1. Acute blood loss anemia secondary to bleed from likely varicose veins of the right leg -bleeding presently arrested after placement of acute clot, dressing removed wound examined, no further bleeding, hemoglobin stable above 9, patient and her husband requesting discharge home, she is hemodynamically stable. .   Follow-up with PCP and possibly vascular surgeon down the road for further treatment of her varicose veins 2. History of GERD on PPI.   Code Status: Full code. Family Communication: Family at the bedside. Disposition Plan: Home  Discharge Condition: stable  Follow UP- pcp for repeat cbc   Diet and Activity recommendation:  As advised  Discharge Instructions     Discharge Instructions    Call MD for:  difficulty breathing, headache or visual disturbances   Complete by:  As directed    Call MD for:  persistant dizziness or light-headedness   Complete by:  As directed    Call MD for:  persistant nausea and vomiting   Complete by:  As directed    Call MD for:  redness, tenderness, or signs of infection (pain, swelling, redness, odor or green/yellow discharge around incision site)   Complete by:  As directed    Call MD for:  severe uncontrolled pain   Complete by:  As directed    Call MD for:  temperature >100.4   Complete by:  As directed    Diet - low sodium heart healthy   Complete by:  As directed    Discharge instructions   Complete by:  As directed    1)Avoid  ibuprofen/Advil/Aleve/Motrin/Goody Powders/Naproxen/BC powders/Meloxicam/Diclofenac/Indomethacin and other Nonsteroidal anti-inflammatory medications as these will make you more likely to bleed and can cause stomach ulcers, can also cause Kidney problems.   2) take iron pills twice a day for 1 month to help your blood count, please note that your stool will become dark while you are taking iron pills  3) avoid injury to the varicose veins on your legs  4) use compressive TED stockings as advised, put them on every morning and take them off at bedtime  5) follow-up with your primary care doctor in 1 to 2 weeks for recheck, this repeat his CBC/complete blood count at that time   Increase activity slowly   Complete by:  As directed         Discharge Medications     Allergies as of 01/12/2018   No Known Allergies     Medication List    TAKE these medications   acetaminophen 500 MG tablet Commonly known as:  TYLENOL Take 500 mg by mouth every 6 (six) hours as needed for mild pain, moderate pain, fever or headache.   albuterol 108 (90 Base) MCG/ACT inhaler Commonly known as:  PROVENTIL HFA;VENTOLIN HFA Inhale 1-2 puffs into the lungs every 6 (six) hours as needed for wheezing or shortness of breath.   aspirin EC 81 MG tablet Take 81 mg by mouth daily.   esomeprazole 20 MG capsule Commonly known as:  NEXIUM Take 20 mg by mouth daily at 12 noon.   ferrous sulfate 325 (65 FE) MG tablet 1 twice a day   Vitamin D (Ergocalciferol) 50000 units Caps capsule Commonly known as:  DRISDOL Take 50,000 Units by mouth every 7 (seven) days. Take every Sunday       Major procedures and Radiology Reports - PLEASE review detailed and final reports for all details, in brief -   No results found.  Micro Results   No results found for this or any previous visit (from the past 240 hour(s)).     Today   Subjective    Amy Beard today has no complaints, no further bleeding from  right leg, no chest pains and palpitations no dyspnea on exertion no dizziness patient desires to go home          Patient has been seen and examined prior to discharge   Objective   Blood pressure 110/70, pulse 77, temperature 98.6 F (37 C), temperature source Oral, resp. rate  18, height 5\' 7"  (1.702 m), weight 89.8 kg (198 lb), SpO2 98 %.   Intake/Output Summary (Last 24 hours) at 01/12/2018 1233 Last data filed at 01/11/2018 2102 Gross per 24 hour  Intake 350 ml  Output -  Net 350 ml    Exam Gen:- Awake Alert,  In no apparent distress  HEENT:- [place].AT, No sclera icterus Neck-Supple Neck,No JVD,.  Lungs-  CTAB , good air movement CV- S1, S2 normal Abd-  +ve B.Sounds, Abd Soft, No tenderness,    Extremity/Skin:-Bleeding site on right lower extremity from varicose vein appears hemostatic, even after removing dressing .  Pulses are good psych-affect is appropriate, oriented x3 Neuro-no new focal deficits, no tremors   Data Review   CBC w Diff:  Lab Results  Component Value Date   WBC 5.4 01/12/2018   HGB 9.5 (L) 01/12/2018   HCT 31.1 (L) 01/12/2018   PLT 260 01/12/2018   LYMPHOPCT 26 01/12/2018   MONOPCT 11 01/12/2018   EOSPCT 1 01/12/2018   BASOPCT 0 01/12/2018    CMP:  Lab Results  Component Value Date   NA 140 01/12/2018   K 4.1 01/12/2018   CL 109 01/12/2018   CO2 25 01/12/2018   BUN 10 01/12/2018   CREATININE 0.52 01/12/2018  .   Total Discharge time is about 33 minutes  Shon Hale M.D on 01/12/2018 at 12:33 PM  [facility]   Office  [phone]  Voice Recognition Reubin Milan dictation system was used to create this note, attempts have been made to correct errors. Please contact the author with questions and/or clarifications.

## 2018-01-12 NOTE — ED Notes (Signed)
Ordered pt lunch tray 

## 2018-01-12 NOTE — ED Notes (Signed)
Report taken from night shift RN - care assumed at this time  

## 2018-01-25 ENCOUNTER — Other Ambulatory Visit: Payer: Self-pay

## 2018-01-25 DIAGNOSIS — I83899 Varicose veins of unspecified lower extremities with other complications: Secondary | ICD-10-CM

## 2018-01-28 ENCOUNTER — Other Ambulatory Visit: Payer: Self-pay

## 2018-01-28 ENCOUNTER — Ambulatory Visit (INDEPENDENT_AMBULATORY_CARE_PROVIDER_SITE_OTHER): Payer: No Typology Code available for payment source | Admitting: Vascular Surgery

## 2018-01-28 ENCOUNTER — Encounter: Payer: Self-pay | Admitting: Vascular Surgery

## 2018-01-28 ENCOUNTER — Ambulatory Visit (HOSPITAL_COMMUNITY)
Admission: RE | Admit: 2018-01-28 | Discharge: 2018-01-28 | Disposition: A | Discharge status: Home / Self Care | Payer: No Typology Code available for payment source | Source: Ambulatory Visit | Attending: Vascular Surgery | Admitting: Vascular Surgery

## 2018-01-28 VITALS — BP 128/85 | HR 56 | Temp 97.7°F | Resp 16 | Ht 67.0 in | Wt 198.0 lb

## 2018-01-28 DIAGNOSIS — I83899 Varicose veins of unspecified lower extremities with other complications: Secondary | ICD-10-CM | POA: Diagnosis not present

## 2018-01-28 DIAGNOSIS — I83811 Varicose veins of right lower extremities with pain: Secondary | ICD-10-CM

## 2018-01-28 NOTE — Progress Notes (Addendum)
Patient name: Amy Beard MRN: [id number] DOB: 10/08/1955 Sex: female   REASON FOR CONSULT:    Spontaneous rupture of varicose vein of the right lower extremity.  This was an urgent consult was requested by Dr. Pearson Grippe.  HPI:   Amy Beard is a pleasant 62 y.o. female, who approximately 2 weeks ago had a significant bleeding episode from a varicose vein in her right calf.  She describes significant blood loss.  She went to the emergency department where they were able to control the bleeding and then she followed up with her primary care physician.  She is quite concerned about the risk of bleeding again.  She has a long history of varicose veins of the right lower extremity.  She works on her feet.  She has not had significant aching pain or heaviness in her legs.  She denies any previous history of DVT or phlebitis.  She has worn knee-high compression stockings since her bleeding episode.  She describes some right lower extremity swelling.  I have reviewed the records from the referring office.  The patient had presented with bleeding from the leg related to her varicose veins.  Patient had to go to the emergency department for this.  Past Medical History:  Diagnosis Date  . Hypertension     Family History  Problem Relation Age of Onset  . Diabetes Mellitus II Mother   . Diabetes Mellitus II Father     SOCIAL HISTORY: Social History   Socioeconomic History  . Marital status: Married    Spouse name: Not on file  . Number of children: Not on file  . Years of education: Not on file  . Highest education level: Not on file  Occupational History  . Not on file  Social Needs  . Financial resource strain: Not on file  . Food insecurity:    Worry: Not on file    Inability: Not on file  . Transportation needs:    Medical: Not on file    Non-medical: Not on file  Tobacco Use  . Smoking status: Never Smoker  . Smokeless tobacco: Never Used  Substance and Sexual  Activity  . Alcohol use: No  . Drug use: No  . Sexual activity: Not on file  Lifestyle  . Physical activity:    Days per week: Not on file    Minutes per session: Not on file  . Stress: Not on file  Relationships  . Social connections:    Talks on phone: Not on file    Gets together: Not on file    Attends religious service: Not on file    Active member of club or organization: Not on file    Attends meetings of clubs or organizations: Not on file    Relationship status: Not on file  . Intimate partner violence:    Fear of current or ex partner: Not on file    Emotionally abused: Not on file    Physically abused: Not on file    Forced sexual activity: Not on file  Other Topics Concern  . Not on file  Social History Narrative  . Not on file    No Known Allergies  Current Outpatient Medications  Medication Sig Dispense Refill  . acetaminophen (TYLENOL) 500 MG tablet Take 500 mg by mouth every 6 (six) hours as needed for mild pain, moderate pain, fever or headache.    . albuterol (PROVENTIL HFA;VENTOLIN HFA) 108 (90 Base) MCG/ACT inhaler Inhale  1-2 puffs into the lungs every 6 (six) hours as needed for wheezing or shortness of breath.    Marland Kitchen. aspirin EC 81 MG tablet Take 81 mg by mouth daily.    Marland Kitchen. esomeprazole (NEXIUM) 20 MG capsule Take 20 mg by mouth daily at 12 noon.    . Vitamin D, Ergocalciferol, (DRISDOL) 50000 UNITS CAPS capsule Take 50,000 Units by mouth every 7 (seven) days. Take every Sunday     No current facility-administered medications for this visit.     REVIEW OF SYSTEMS:  [X]  denotes positive finding, [ ]  denotes negative finding Cardiac  Comments:  Chest pain or chest pressure:    Shortness of breath upon exertion: x   Short of breath when lying flat:    Irregular heart rhythm:        Vascular    Pain in calf, thigh, or hip brought on by ambulation:    Pain in feet at night that wakes you up from your sleep:     Blood clot in your veins:    Leg  swelling:  x       Pulmonary    Oxygen at home:    Productive cough:  x   Wheezing:         Neurologic    Sudden weakness in arms or legs:     Sudden numbness in arms or legs:     Sudden onset of difficulty speaking or slurred speech:    Temporary loss of vision in one eye:     Problems with dizziness:         Gastrointestinal    Blood in stool:     Vomited blood:         Genitourinary    Burning when urinating:     Blood in urine:        Psychiatric    Major depression:         Hematologic    Bleeding problems:    Problems with blood clotting too easily:        Skin    Rashes or ulcers:        Constitutional    Fever or chills:     PHYSICAL EXAM:   Vitals:   01/28/18 1011  BP: 128/85  Pulse: (!) 56  Resp: 16  Temp: 97.7 F (36.5 C)  TempSrc: Oral  SpO2: 99%  Weight: 198 lb (89.8 kg)  Height: 5\' 7"  (1.702 m)    GENERAL: The patient is a well-nourished female, in no acute distress. The vital signs are documented above. CARDIAC: There is a regular rate and rhythm.  VASCULAR: I do not detect carotid bruits. She has a palpable left posterior tibial pulse.  I cannot palpate any other pedal pulses however she has brisk Doppler signals in the dorsalis pedis and posterior tibial positions bilaterally. She has truncal varicosities along the medial aspect of her right calf. I did interrogate with the SonoSite and she has a dilated incompetent right great saphenous vein which feeds into the varicose veins in her right calf.  It looks like I could potentially access the vein just below the knee. PULMONARY: There is good air exchange bilaterally without wheezing or rales. ABDOMEN: Soft and non-tender with normal pitched bowel sounds.  MUSCULOSKELETAL: There are no major deformities or cyanosis. NEUROLOGIC: No focal weakness or paresthesias are detected. SKIN: There are no ulcers or rashes noted. PSYCHIATRIC: The patient has a normal affect.  DATA:    RIGHT LOWER  EXTREMITY VENOUS DUPLEX STUDY: I have independently interpreted the right lower extremity venous duplex study that was done today.  Patient has no evidence of DVT or superficial thrombophlebitis.  There is significant deep venous reflux involving the common femoral vein, and femoral vein.  There is also significant reflux throughout the entire right great saphenous vein.  The diameters of the vein are significantly dilated.  The great saphenous vein measures 0.89 cm in the proximal calf and 0.84 cm in the proximal thigh.  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  She had a significant bleeding episode and is quite concerned about having further bleeding.  We have discussed conservative measures including leg elevation and the proper positioning for this, thigh-high compression stockings with a gradient of 20 to 30 mmHg, trying to avoid prolonged sitting and standing, exercising, and weight management.  I have also discussed with her the importance of elevating her legs if she does have another bleeding episode and holding pressure over the site.    In addition, I think she is a good candidate for endovenous laser ablation of her incompetent right great saphenous vein and 10-20 stab phlebectomies in the right calf in order to prevent recurrent bleeding problems.  In addition, I think she would be a good candidate to have sclerotherapy of the area that bled.  I have discussed the indications for endovenous laser ablation of the right GSV and stab phlebectomies of varicose veins of the right leg. That is to lower the pressure in the veins and potentially help relieve the symptoms from venous hypertension. I have also discussed alternative options including conservative treatment with leg elevation, compression  therapy, and other lifestyle changes. I have discussed the potential complications of the procedure, including, but not limited to: bleeding, bruising, leg swelling, nerve injury, skin burns, significant pain from phlebitis, deep venous thrombosis, or failure of the vein to close. All of the patient's questions were encouraged and answered.  She would like to pursue treatment and we will work on a follow-up visit and arrangements for this.   Waverly Ferrari [facility] [phone]

## 2018-04-02 ENCOUNTER — Emergency Department (HOSPITAL_COMMUNITY): Payer: PRIVATE HEALTH INSURANCE

## 2018-04-02 ENCOUNTER — Encounter (HOSPITAL_COMMUNITY): Payer: Self-pay | Admitting: Emergency Medicine

## 2018-04-02 ENCOUNTER — Inpatient Hospital Stay (HOSPITAL_COMMUNITY)
Admission: EM | Admit: 2018-04-02 | Discharge: 2018-04-05 | DRG: 811 | Disposition: A | Discharge status: Home / Self Care | Payer: PRIVATE HEALTH INSURANCE | Attending: Internal Medicine | Admitting: Internal Medicine

## 2018-04-02 ENCOUNTER — Other Ambulatory Visit: Payer: Self-pay

## 2018-04-02 ENCOUNTER — Inpatient Hospital Stay (HOSPITAL_COMMUNITY): Payer: PRIVATE HEALTH INSURANCE

## 2018-04-02 DIAGNOSIS — K625 Hemorrhage of anus and rectum: Secondary | ICD-10-CM | POA: Diagnosis present

## 2018-04-02 DIAGNOSIS — Z7982 Long term (current) use of aspirin: Secondary | ICD-10-CM | POA: Diagnosis not present

## 2018-04-02 DIAGNOSIS — J45902 Unspecified asthma with status asthmaticus: Secondary | ICD-10-CM | POA: Diagnosis not present

## 2018-04-02 DIAGNOSIS — D62 Acute posthemorrhagic anemia: Secondary | ICD-10-CM | POA: Diagnosis present

## 2018-04-02 DIAGNOSIS — I10 Essential (primary) hypertension: Secondary | ICD-10-CM | POA: Diagnosis present

## 2018-04-02 DIAGNOSIS — K649 Unspecified hemorrhoids: Secondary | ICD-10-CM | POA: Diagnosis present

## 2018-04-02 DIAGNOSIS — K5731 Diverticulosis of large intestine without perforation or abscess with bleeding: Secondary | ICD-10-CM | POA: Diagnosis present

## 2018-04-02 DIAGNOSIS — R402140 Coma scale, eyes open, spontaneous, unspecified time: Secondary | ICD-10-CM | POA: Diagnosis present

## 2018-04-02 DIAGNOSIS — D638 Anemia in other chronic diseases classified elsewhere: Secondary | ICD-10-CM | POA: Diagnosis present

## 2018-04-02 DIAGNOSIS — R000 Tachycardia, unspecified: Secondary | ICD-10-CM | POA: Diagnosis present

## 2018-04-02 DIAGNOSIS — K449 Diaphragmatic hernia without obstruction or gangrene: Secondary | ICD-10-CM | POA: Diagnosis present

## 2018-04-02 DIAGNOSIS — Z79899 Other long term (current) drug therapy: Secondary | ICD-10-CM

## 2018-04-02 DIAGNOSIS — K5900 Constipation, unspecified: Secondary | ICD-10-CM | POA: Diagnosis present

## 2018-04-02 DIAGNOSIS — J45909 Unspecified asthma, uncomplicated: Secondary | ICD-10-CM | POA: Diagnosis present

## 2018-04-02 DIAGNOSIS — R402250 Coma scale, best verbal response, oriented, unspecified time: Secondary | ICD-10-CM | POA: Diagnosis present

## 2018-04-02 DIAGNOSIS — K219 Gastro-esophageal reflux disease without esophagitis: Secondary | ICD-10-CM | POA: Diagnosis present

## 2018-04-02 DIAGNOSIS — I8390 Asymptomatic varicose veins of unspecified lower extremity: Secondary | ICD-10-CM | POA: Diagnosis present

## 2018-04-02 DIAGNOSIS — K5730 Diverticulosis of large intestine without perforation or abscess without bleeding: Secondary | ICD-10-CM | POA: Diagnosis not present

## 2018-04-02 DIAGNOSIS — E559 Vitamin D deficiency, unspecified: Secondary | ICD-10-CM | POA: Diagnosis present

## 2018-04-02 DIAGNOSIS — K921 Melena: Secondary | ICD-10-CM

## 2018-04-02 DIAGNOSIS — R402360 Coma scale, best motor response, obeys commands, unspecified time: Secondary | ICD-10-CM | POA: Diagnosis present

## 2018-04-02 DIAGNOSIS — I9589 Other hypotension: Secondary | ICD-10-CM

## 2018-04-02 DIAGNOSIS — R58 Hemorrhage, not elsewhere classified: Secondary | ICD-10-CM | POA: Diagnosis present

## 2018-04-02 DIAGNOSIS — R51 Headache: Secondary | ICD-10-CM | POA: Diagnosis present

## 2018-04-02 DIAGNOSIS — K5790 Diverticulosis of intestine, part unspecified, without perforation or abscess without bleeding: Secondary | ICD-10-CM | POA: Diagnosis not present

## 2018-04-02 DIAGNOSIS — I959 Hypotension, unspecified: Secondary | ICD-10-CM | POA: Diagnosis present

## 2018-04-02 HISTORY — DX: Gastrointestinal hemorrhage, unspecified: K92.2

## 2018-04-02 LAB — PREPARE RBC (CROSSMATCH)

## 2018-04-02 LAB — CBC
HEMATOCRIT: 35.5 % — AB (ref 36.0–46.0)
HEMATOCRIT: 32.2 % — AB (ref 36.0–46.0)
HEMOGLOBIN: 10.4 g/dL — AB (ref 12.0–15.0)
HEMOGLOBIN: 9.5 g/dL — AB (ref 12.0–15.0)
MCH: 23.7 pg — AB (ref 26.0–34.0)
MCH: 24 pg — ABNORMAL LOW (ref 26.0–34.0)
MCHC: 29.3 g/dL — AB (ref 30.0–36.0)
MCHC: 29.5 g/dL — ABNORMAL LOW (ref 30.0–36.0)
MCV: 80.9 fL (ref 78.0–100.0)
MCV: 81.3 fL (ref 78.0–100.0)
PLATELETS: 311 10*3/uL (ref 150–400)
Platelets: 257 10*3/uL (ref 150–400)
RBC: 4.39 MIL/uL (ref 3.87–5.11)
RBC: 3.96 MIL/uL (ref 3.87–5.11)
RDW: 13.9 % (ref 11.5–15.5)
RDW: 14 % (ref 11.5–15.5)
WBC: 7.7 10*3/uL (ref 4.0–10.5)
WBC: 7.4 10*3/uL (ref 4.0–10.5)

## 2018-04-02 LAB — COMPREHENSIVE METABOLIC PANEL
ALBUMIN: 3.4 g/dL — AB (ref 3.5–5.0)
ALT: 15 U/L (ref 0–44)
ANION GAP: 8 (ref 5–15)
AST: 20 U/L (ref 15–41)
Alkaline Phosphatase: 59 U/L (ref 38–126)
BUN: 11 mg/dL (ref 8–23)
CALCIUM: 8.6 mg/dL — AB (ref 8.9–10.3)
CO2: 24 mmol/L (ref 22–32)
Chloride: 105 mmol/L (ref 98–111)
Creatinine, Ser: 0.79 mg/dL (ref 0.44–1.00)
GFR calc non Af Amer: >60 mL/min (ref >60)
GLUCOSE: 136 mg/dL — AB (ref 70–99)
Potassium: 3.7 mmol/L (ref 3.5–5.1)
SODIUM: 137 mmol/L (ref 135–145)
TOTAL PROTEIN: 7.4 g/dL (ref 6.5–8.1)
Total Bilirubin: 0.7 mg/dL (ref 0.3–1.2)

## 2018-04-02 LAB — PROTIME-INR
INR: 1.16
Prothrombin Time: 14.7 seconds (ref 11.4–15.2)

## 2018-04-02 MED ORDER — ALBUTEROL SULFATE (2.5 MG/3ML) 0.083% IN NEBU: 2.5000 mg | INHALATION_SOLUTION | Freq: Four times a day (QID) | RESPIRATORY_TRACT | Status: DC | PRN

## 2018-04-02 MED ORDER — ACETAMINOPHEN 325 MG PO TABS
650.0000 mg | ORAL_TABLET | Freq: Four times a day (QID) | ORAL | Status: DC | PRN
  Filled 2018-04-02: qty 2

## 2018-04-02 MED ORDER — SODIUM CHLORIDE 0.9% FLUSH
3.0000 mL | Freq: Two times a day (BID) | INTRAVENOUS | Status: DC
  Administered 2018-04-03 – 2018-04-05 (×6): 3 mL via INTRAVENOUS

## 2018-04-02 MED ORDER — IOPAMIDOL (ISOVUE-300) INJECTION 61%
INTRAVENOUS | Status: AC
  Filled 2018-04-02: qty 100

## 2018-04-02 MED ORDER — ONDANSETRON HCL 4 MG/2ML IJ SOLN
4.0000 mg | Freq: Once | INTRAMUSCULAR | Status: AC
  Administered 2018-04-02: 4 mg via INTRAVENOUS

## 2018-04-02 MED ORDER — IOPAMIDOL (ISOVUE-370) INJECTION 76%
INTRAVENOUS | Status: AC
  Filled 2018-04-02: qty 100

## 2018-04-02 MED ORDER — SODIUM CHLORIDE 0.9% FLUSH: 3.0000 mL | INTRAVENOUS | Status: DC | PRN

## 2018-04-02 MED ORDER — ONDANSETRON HCL 4 MG PO TABS: 4.0000 mg | ORAL_TABLET | Freq: Four times a day (QID) | ORAL | Status: DC | PRN

## 2018-04-02 MED ORDER — PANTOPRAZOLE SODIUM 40 MG IV SOLR
40.0000 mg | Freq: Two times a day (BID) | INTRAVENOUS | Status: DC
  Administered 2018-04-03 – 2018-04-05 (×6): 40 mg via INTRAVENOUS
  Filled 2018-04-02 (×7): qty 40

## 2018-04-02 MED ORDER — ONDANSETRON HCL 4 MG/2ML IJ SOLN
4.0000 mg | Freq: Four times a day (QID) | INTRAMUSCULAR | Status: DC | PRN
  Administered 2018-04-04: 4 mg via INTRAVENOUS
  Filled 2018-04-02: qty 2

## 2018-04-02 MED ORDER — SODIUM CHLORIDE 0.9 % IV SOLN
INTRAVENOUS | Status: AC
  Administered 2018-04-03: 01:00:00 via INTRAVENOUS

## 2018-04-02 MED ORDER — ONDANSETRON HCL 4 MG/2ML IJ SOLN
INTRAMUSCULAR | Status: AC
  Filled 2018-04-02: qty 2

## 2018-04-02 MED ORDER — SODIUM CHLORIDE 0.9 % IV SOLN
10.0000 mL/h | Freq: Once | INTRAVENOUS | Status: AC
  Administered 2018-04-02: 10 mL/h via INTRAVENOUS

## 2018-04-02 MED ORDER — SODIUM CHLORIDE 0.9 % IV SOLN: 10.0000 mL/h | Freq: Once | INTRAVENOUS | Status: DC

## 2018-04-02 MED ORDER — ACETAMINOPHEN 650 MG RE SUPP: 650.0000 mg | Freq: Four times a day (QID) | RECTAL | Status: DC | PRN

## 2018-04-02 MED ORDER — SODIUM CHLORIDE 0.9 % IV SOLN: 250.0000 mL | INTRAVENOUS | Status: DC | PRN

## 2018-04-02 MED ORDER — IOPAMIDOL (ISOVUE-370) INJECTION 76%
100.0000 mL | Freq: Once | INTRAVENOUS | Status: AC | PRN
  Administered 2018-04-02: 100 mL via INTRAVENOUS

## 2018-04-02 NOTE — ED Provider Notes (Signed)
[facility]   CSN: [id number] Arrival date & time: 04/02/18  1729     History   Chief Complaint Chief Complaint  Patient presents with  . GI Bleeding    HPI Amy Beard is a 62 y.o. female.  The history is provided by the patient.  Rectal Bleeding  Quality:  Bright red Amount:  Moderate Duration:  2 hours Timing:  Constant Chronicity:  New Context: defecation   Context: not constipation and not rectal injury   Similar prior episodes: no   Relieved by:  None tried Worsened by:  Defecation and wiping Ineffective treatments:  None tried Associated symptoms: dizziness and light-headedness   Associated symptoms: no abdominal pain, no epistaxis, no fever, no hematemesis, no loss of consciousness and no vomiting   Risk factors: no anticoagulant use and no hx of colorectal cancer     Past Medical History:  Diagnosis Date  . Hypertension     Patient Active Problem List   Diagnosis Date Noted  . Rectal bleeding 04/02/2018  . Hypotension due to blood loss 04/02/2018  . Rectal bleed 04/02/2018  . Acute blood loss anemia 01/12/2018  . Bleeding from varicose vein     History reviewed. No pertinent surgical history.   OB History   None      Home Medications    Prior to Admission medications   Medication Sig Start Date End Date Taking? Authorizing Provider  acetaminophen (TYLENOL) 500 MG tablet Take 1,500 mg by mouth at bedtime.    Yes [provider]  albuterol (PROVENTIL HFA;VENTOLIN HFA) 108 (90 Base) MCG/ACT inhaler Inhale 1-2 puffs into the lungs every 6 (six) hours as needed for wheezing or shortness of breath.   Yes [provider]  aspirin EC 81 MG tablet Take 81 mg by mouth daily.   Yes [provider]  esomeprazole (NEXIUM) 20 MG capsule Take 20 mg by mouth daily.    Yes [provider]  naproxen sodium (ALEVE) 220 MG tablet Take 660 mg by mouth at bedtime as needed  (pain).   Yes [provider]  OVER THE COUNTER MEDICATION Place 1 drop into both eyes daily as needed (dry eyes). Over the counter lubricating eye drop   Yes [provider]  polyethylene glycol (MIRALAX / GLYCOLAX) packet Take 17 g by mouth daily as needed.   Yes [provider]  Vitamin D, Ergocalciferol, (DRISDOL) 50000 UNITS CAPS capsule Take 50,000 Units by mouth every Sunday.    Yes [provider]    Family History Family History  Problem Relation Age of Onset  . Diabetes Mellitus II Mother   . Diabetes Mellitus II Father     Social History Social History   Tobacco Use  . Smoking status: Never Smoker  . Smokeless tobacco: Never Used  Substance Use Topics  . Alcohol use: No  . Drug use: No     Allergies   Patient has no known allergies.   Review of Systems Review of Systems  Constitutional: Negative for chills and fever.  HENT: Negative for ear pain, nosebleeds and sore throat.   Eyes: Negative for pain and visual disturbance.  Respiratory: Negative for cough and shortness of breath.        Hemoptysis  Cardiovascular: Negative for chest pain and palpitations.  Gastrointestinal: Positive for hematochezia. Negative for abdominal pain, hematemesis and vomiting.       No melena  Genitourinary: Negative for dysuria, hematuria and  vaginal bleeding.  Musculoskeletal: Negative for arthralgias and back pain.  Skin: Negative for color change and rash.  Neurological: Positive for dizziness and light-headedness. Negative for seizures, loss of consciousness and syncope.  All other systems reviewed and are negative.    Physical Exam Updated Vital Signs BP 101/61   Pulse 82   Temp (!) 97.4 F (36.3 C) (Oral)   Resp 15   Ht 5\' 7"  (1.702 m)   Wt 90.4 kg   SpO2 97%   BMI 31.21 kg/m   Physical Exam  Constitutional: She is oriented to person, place, and time. She appears well-developed and well-nourished. She appears distressed.    HENT:  Head: Normocephalic and atraumatic.  Eyes: Conjunctivae are normal.  Neck: Neck supple.  Cardiovascular: Normal rate, regular rhythm and intact distal pulses.  No murmur heard. Pulmonary/Chest: Effort normal and breath sounds normal. No respiratory distress.  Abdominal: Soft. There is no tenderness.  Genitourinary:  Genitourinary Comments: Bright red blood per rectum  Musculoskeletal: She exhibits no edema.  Neurological: She is alert and oriented to person, place, and time.  Skin: Skin is warm. She is not diaphoretic.  Psychiatric: Her mood appears anxious.  Nursing note and vitals reviewed.    ED Treatments / Results  Labs (all labs ordered are listed, but only abnormal results are displayed) Labs Reviewed  COMPREHENSIVE METABOLIC PANEL - Abnormal; Notable for the following components:      Result Value   Glucose, Bld 136 (*)    Calcium 8.6 (*)    Albumin 3.4 (*)    All other components within normal limits  CBC - Abnormal; Notable for the following components:   Hemoglobin 10.4 (*)    HCT 35.5 (*)    MCH 23.7 (*)    MCHC 29.3 (*)    All other components within normal limits  CBC - Abnormal; Notable for the following components:   Hemoglobin 9.5 (*)    HCT 32.2 (*)    MCH 24.0 (*)    MCHC 29.5 (*)    All other components within normal limits  BASIC METABOLIC PANEL - Abnormal; Notable for the following components:   Glucose, Bld 124 (*)    Calcium 8.1 (*)    All other components within normal limits  HEMOGLOBIN - Abnormal; Notable for the following components:   Hemoglobin 10.2 (*)    All other components within normal limits  HEMOGLOBIN - Abnormal; Notable for the following components:   Hemoglobin 9.2 (*)    All other components within normal limits  HEMATOCRIT - Abnormal; Notable for the following components:   HCT 33.0 (*)    All other components within normal limits  HEMATOCRIT - Abnormal; Notable for the following components:   HCT 29.1 (*)     All other components within normal limits  MRSA PCR SCREENING  PROTIME-INR  POC OCCULT BLOOD, ED  TYPE AND SCREEN  PREPARE RBC (CROSSMATCH)  PREPARE RBC (CROSSMATCH)  PREPARE RBC (CROSSMATCH)    EKG None  Radiology Ct Angio Abd/pel W And/or Wo Contrast  Result Date: 04/03/2018 CLINICAL DATA:  62 year old female with liquid loose stools. Rectal bleeding. EXAM: CTA ABDOMEN AND PELVIS WITH CONTRAST TECHNIQUE: Multidetector CT imaging of the abdomen and pelvis was performed using the standard protocol during bolus administration of intravenous contrast. Multiplanar reconstructed images and MIPs were obtained and reviewed to evaluate the vascular anatomy. CONTRAST:  ISOVUE-370 IOPAMIDOL (ISOVUE-370) INJECTION 76% COMPARISON:  None. FINDINGS: VASCULAR Aorta: Normal caliber aorta without aneurysm,  dissection, vasculitis or significant stenosis. Celiac: Patent without evidence of aneurysm, dissection, vasculitis or significant stenosis. SMA: Patent without evidence of aneurysm, dissection, vasculitis or significant stenosis. Renals: Both renal arteries are patent without evidence of aneurysm, dissection, vasculitis, fibromuscular dysplasia or significant stenosis. IMA: Patent without evidence of aneurysm, dissection, vasculitis or significant stenosis. Inflow: Patent without evidence of aneurysm, dissection, vasculitis or significant stenosis. Proximal Outflow: Bilateral common femoral and visualized portions of the superficial and profunda femoral arteries are patent without evidence of aneurysm, dissection, vasculitis or significant stenosis. Veins: No obvious venous abnormality within the limitations of this arterial phase study. Review of the MIP images confirms the above findings. NON-VASCULAR Lower chest: Mild bibasal subpleural thickening. The visualized lung bases are otherwise clear. There is a moderate-sized pericardial effusion measuring up to 2 cm in thickness. Correlation with  echocardiogram recommended. There is no intra-abdominal free air or free fluid. Hepatobiliary: A 1 cm hypodense lesion in the right lobe of the liver is not well characterized but demonstrates fluid attenuation most likely representing a cyst. The liver is otherwise unremarkable. No intrahepatic biliary ductal dilatation. The gallbladder is unremarkable as well. Pancreas: Unremarkable. No pancreatic ductal dilatation or surrounding inflammatory changes. Spleen: Subcentimeter splenic hypodense lesion is not well characterized but may represent a cyst or hemangioma. Adrenals/Urinary Tract: The adrenal glands are unremarkable. Small bilateral renal cysts and subcentimeter hypodensities which are too small to characterize. The kidneys are otherwise unremarkable. There is symmetric enhancement of the kidneys. The visualized ureters and urinary bladder appear unremarkable. Stomach/Bowel: The stomach is moderately distended with ingested content. There are scattered colonic diverticula without active inflammatory changes. There is no bowel obstruction or active inflammation. Loose stool noted throughout the colon compatible with diarrheal state. There is apparent mucosal enhancement of the rectum most likely from prominent hemorrhoidal vessels or bleed. The appendix is normal. Lymphatic: No significant vascular findings are present. No enlarged abdominal or pelvic lymph nodes. Reproductive: The uterus is retroflexed. There is a 2.7 cm right uterine calcified fibroid. A 15 mm fatty lesion in the region of the right adnexa most consistent with dermoid. The left ovary is unremarkable. Other: None Musculoskeletal: There is degenerative changes of the spine. Grade 1 L4-L5 and L5-S1 retrolisthesis. No acute osseous pathology. IMPRESSION: VASCULAR No acute findings.  No significant vascular pathology. NON-VASCULAR 1. Probable prominent hemorrhoidal vessels with associated minimal bleed at the rectum. Diarrheal state. 2. Colonic  diverticulosis. No bowel obstruction or active inflammation. Normal appendix. 3. A 1.5 cm right ovarian dermoid. Electronically Signed   By: Elgie Collard M.D.   On: 04/03/2018 00:00    Procedures Procedures (including critical care time)  Medications Ordered in ED Medications  iopamidol (ISOVUE-300) 61 % injection (has no administration in time range)  iopamidol (ISOVUE-370) 76 % injection (has no administration in time range)  albuterol (PROVENTIL) (2.5 MG/3ML) 0.083% nebulizer solution 2.5 mg (has no administration in time range)  sodium chloride flush (NS) 0.9 % injection 3 mL (3 mLs Intravenous Given 04/03/18 1123)  sodium chloride flush (NS) 0.9 % injection 3 mL (3 mLs Intravenous Given 04/03/18 1122)  sodium chloride flush (NS) 0.9 % injection 3 mL (has no administration in time range)  0.9 %  sodium chloride infusion (has no administration in time range)  acetaminophen (TYLENOL) tablet 650 mg (has no administration in time range)    Or  acetaminophen (TYLENOL) suppository 650 mg (has no administration in time range)  ondansetron (ZOFRAN) tablet 4 mg (has no administration in  time range)    Or  ondansetron (ZOFRAN) injection 4 mg (has no administration in time range)  pantoprazole (PROTONIX) injection 40 mg (40 mg Intravenous Given 04/03/18 0939)  0.9 %  sodium chloride infusion ( Intravenous New Bag/Given 04/03/18 0059)  polyethylene glycol-electrolytes (NuLYTELY/GoLYTELY) solution 4,000 mL (has no administration in time range)  0.9 %  sodium chloride infusion (0 mL/hr Intravenous Stopped 04/02/18 2342)  ondansetron (ZOFRAN) injection 4 mg (4 mg Intravenous Given 04/02/18 1957)  iopamidol (ISOVUE-370) 76 % injection 100 mL (100 mLs Intravenous Contrast Given 04/02/18 2257)  sodium chloride 0.9 % bolus 500 mL (500 mLs Intravenous New Bag/Given 04/03/18 0649)     Initial Impression / Assessment and Plan / ED Course  I have reviewed the triage vital signs and the nursing  notes.  Pertinent labs & imaging results that were available during my care of the patient were reviewed by me and considered in my medical decision making (see chart for details).     The patient is a 62 year old female with past medical history of GERD who presents with bright red bleeding per rectum.  The patient reports that the bleeding started 2 hours ago with a bowel movement and she has had 5 total episodes.  The patient was actively bleeding in triage, but on my exam she did not have any active bleeding from her rectum.  She did have dried blood around her rectum without any clots.  The patient endorses daily aspirin use but denies anticoagulation. She does report increased NSAID use recently due to pain from a recent procedure. CBC reveals hemoglobin 10.4 (this is increased from hemoglobin months ago, which was 9.5), does not show leukocytosis or thrombocytopenia.  Repeat Hgb shows 9.5. Type and screen O+, CMP reveals no electrolyte abnormalities, no AKI, and no LFT abnormalities.  Patient began to have worsening GI bleeding while in the emergency department and had diaphoresis and hypotension.  Patient was given 2 units of emergency release pRBCs.  She reports feeling improved and blood pressures improved to 90s over 60s.  GI consulted and recommended CTA abdomen and pelvis and critical care consult.  Critical care consulted and recommended admission to medicine with telemetry.  Patient admitted to internal medicine service.  Patient care supervised by Dr. Rush Landmark.  Nash Dimmer, MD  Final Clinical Impressions(s) / ED Diagnoses   Final diagnoses:  Rectal bleeding  Hypotension, unspecified hypotension type    ED Discharge Orders    None       Nash Dimmer, MD 04/03/18 1216    Tegeler, Canary Brim, MD 04/04/18 1043

## 2018-04-02 NOTE — H&P (Signed)
History and Physical    JACQUELYNN FRIEND WJX:[id number] DOB: [birth year] DOA: 04/02/2018  PCP: Pearson Grippe, MD   Patient coming from: Home   Chief Complaint: Painless hematochezia, gen weakness, lightheadedness   HPI: Amy Beard is a 62 y.o. female with medical history significant for GERD, now presenting to the emergency department with generalized weakness and lightheadedness after several episodes of painless hematochezia.  Patient reports that she had been in her usual state of health until this afternoon when she had an urge to defecate and had mostly blood pass.  She went on to have approximately 5 episodes of painless rectal bleeding with progressive generalized weakness and lightheadedness.  She denies any abdominal pain or vomiting.  She denies chest pain or headache.  She had been using Aleve and BC powder recently for headache and recent varicose vein treatment.  She denies fevers or chills and has not experienced these symptoms previously.  She denies losing consciousness.  ED Course: Upon arrival to the ED, patient is found to be afebrile, saturating well on room air, slightly tachycardic, and with blood pressure 78/52.  Chemistry panel is unremarkable and CBC is notable for hemoglobin of 10.4, up from 9.5 in June.  INR is 1.16.  Patient became hypotensive in the ED and 2 units of emergency release RBCs were transfused with improvement in blood pressure.  Gastroenterology was consulted by the ED physician, has evaluated the patient in the emergency department, and recommends CTA to localize bleeding, and if negative, NG tube with gastric lavage.  ED physician discussed the case with critical care who did not feel that she would require ICU.  CTA has been ordered, not yet performed, blood pressure has improved, and the patient will be admitted to the stepdown unit for ongoing evaluation and management.  Review of Systems:  All other systems reviewed and apart from HPI, are  negative.  Past Medical History:  Diagnosis Date  . Hypertension     History reviewed. No pertinent surgical history.   reports that she has never smoked. She has never used smokeless tobacco. She reports that she does not drink alcohol or use drugs.  No Known Allergies  Family History  Problem Relation Age of Onset  . Diabetes Mellitus II Mother   . Diabetes Mellitus II Father      Prior to Admission medications   Medication Sig Start Date End Date Taking? Authorizing Provider  acetaminophen (TYLENOL) 500 MG tablet Take 1,500 mg by mouth at bedtime.    Yes [provider]  albuterol (PROVENTIL HFA;VENTOLIN HFA) 108 (90 Base) MCG/ACT inhaler Inhale 1-2 puffs into the lungs every 6 (six) hours as needed for wheezing or shortness of breath.   Yes [provider]  aspirin EC 81 MG tablet Take 81 mg by mouth daily.   Yes [provider]  esomeprazole (NEXIUM) 20 MG capsule Take 20 mg by mouth daily.    Yes [provider]  naproxen sodium (ALEVE) 220 MG tablet Take 660 mg by mouth at bedtime as needed (pain).   Yes [provider]  OVER THE COUNTER MEDICATION Place 1 drop into both eyes daily as needed (dry eyes). Over the counter lubricating eye drop   Yes [provider]  polyethylene glycol (MIRALAX / GLYCOLAX) packet Take 17 g by mouth daily as needed.   Yes [provider]  Vitamin D, Ergocalciferol, (DRISDOL) 50000 UNITS CAPS capsule Take 50,000 Units by mouth every Sunday.    Yes [provider]    Physical Exam: Vitals:   04/02/18 2145 04/02/18 2200 04/02/18 2215 04/02/18 2230  BP: (!) 110/92 (!) 84/58 (!) 95/59 (!) 93/55  Pulse:      Resp: (!) 21 12 14  (!) 23  Temp:      TempSrc:      SpO2:      Weight:      Height:          Constitutional: NAD, calm, appears uncomfortable  Eyes: PERTLA, lids and conjunctivae normal ENMT: Mucous membranes are dry and pale. Posterior pharynx clear of any  exudate or lesions.   Neck: normal, supple, no masses, no thyromegaly Respiratory: clear to auscultation bilaterally, no wheezing, no crackles. Normal respiratory effort.    Cardiovascular: S1 & S2 heard, regular rate and rhythm. No extremity edema.   Abdomen: No distension, no tenderness, soft. Bowel sounds active.  Musculoskeletal: no clubbing / cyanosis. No joint deformity upper and lower extremities.    Skin: no significant rashes, lesions, ulcers. Warm, dry, well-perfused. Neurologic: CN 2-12 grossly intact. Sensation intact. Strength 5/5 in all 4 limbs.  Psychiatric: Alert and oriented x 3. Pleasant and cooperative.     Labs on Admission: I have personally reviewed following labs and imaging studies  CBC: Recent Labs  Lab 04/02/18 1745 04/02/18 2021  WBC 7.7 7.4  HGB 10.4* 9.5*  HCT 35.5* 32.2*  MCV 80.9 81.3  PLT 311 257   Basic Metabolic Panel: Recent Labs  Lab 04/02/18 1745  NA 137  K 3.7  CL 105  CO2 24  GLUCOSE 136*  BUN 11  CREATININE 0.79  CALCIUM 8.6*   GFR: Estimated Creatinine Clearance: 84.2 mL/min (by C-G formula based on SCr of 0.79 mg/dL). Liver Function Tests: Recent Labs  Lab 04/02/18 1745  AST 20  ALT 15  ALKPHOS 59  BILITOT 0.7  PROT 7.4  ALBUMIN 3.4*   No results for input(s): LIPASE, AMYLASE in the last 168 hours. No results for input(s): AMMONIA in the last 168 hours. Coagulation Profile: Recent Labs  Lab 04/02/18 2021  INR 1.16   Cardiac Enzymes: No results for input(s): CKTOTAL, CKMB, CKMBINDEX, TROPONINI in the last 168 hours. BNP (last 3 results) No results for input(s): PROBNP in the last 8760 hours. HbA1C: No results for input(s): HGBA1C in the last 72 hours. CBG: No results for input(s): GLUCAP in the last 168 hours. Lipid Profile: No results for input(s): CHOL, HDL, LDLCALC, TRIG, CHOLHDL, LDLDIRECT in the last 72 hours. Thyroid Function Tests: No results for input(s): TSH, T4TOTAL, FREET4, T3FREE, THYROIDAB in  the last 72 hours. Anemia Panel: No results for input(s): VITAMINB12, FOLATE, FERRITIN, TIBC, IRON, RETICCTPCT in the last 72 hours. Urine analysis:    Component Value Date/Time   COLORURINE YELLOW 05/09/2011 2158   APPEARANCEUR CLOUDY (A) 05/09/2011 2158   LABSPEC 1.008 05/09/2011 2158   PHURINE 8.0 05/09/2011 2158   GLUCOSEU NEGATIVE 05/09/2011 2158   HGBUR TRACE (A) 05/09/2011 2158   BILIRUBINUR NEGATIVE 05/09/2011 2158   KETONESUR NEGATIVE 05/09/2011 2158   PROTEINUR NEGATIVE 05/09/2011 2158   UROBILINOGEN 0.2 05/09/2011 2158   NITRITE NEGATIVE 05/09/2011 2158   LEUKOCYTESUR NEGATIVE 05/09/2011 2158   Sepsis Labs: @LABRCNTIP (procalcitonin:4,lacticidven:4) )No results found for this or any previous visit (from the past 240 hour(s)).   Radiological Exams on Admission: No results found.  EKG: Not performed.   Assessment/Plan   1. Rectal bleeding with acute blood-loss anemia  - Presents with painless hematochezia, suggestive of diverticular disease,  but has been using NSAID's and was hypotensive rasing some concern for brisk upper GI bleeding  - She was transfused with 2 units in ED and BP has improved, SBP remains in 90's  - Appreciate GI involvement, will send for CTA, and if CTA neg, NGT with gastric lavage  - Fluid-resuscitate, trend H&H, transfuse as needed    DVT prophylaxis: SCD's  Code Status: Full  Family Communication: Discussed with patient  Consults called: GI, PCCM  Admission status: Inpatient     Briscoe Deutscherimothy S Opyd, MD [facility] Pager [phone]  If 7PM-7AM, please contact night-coverage [email or web address] Password [email or web address]  04/02/2018, 11:19 PM

## 2018-04-02 NOTE — ED Triage Notes (Signed)
Pt states had a normal BM yesterday. States today around 3pm began having loose liquid blood stool, X 5. Pt is actively bleeding from rectum at triage, BP 97/53. Not on anticoagulants, does take an asprin daily. Pt feels weak and dizzy.

## 2018-04-02 NOTE — ED Notes (Signed)
Pt transported to CT with RN

## 2018-04-02 NOTE — ED Notes (Signed)
Patient feeling a little better since receiving fluid.

## 2018-04-02 NOTE — Consult Note (Addendum)
Consult for [facility]  Reason for Consult: Hematochezia, hypotension, and anemia Referring Physician: [facility]  Angelica Ran HPI: This is a 62 year old female with a PMH of HTN admitted for complaints of painless hematochezia.  Her symptoms started this afternoon and it progressively worsened.   She denies having this issue in the past and her last colonoscopy was at the age of 39.  She does not recall the findings of that procedure, but she thought that it was normal.  In the ER she was noted to be hypotensive and she was actively experiencing hematochezia.  There was no associated abdominal pain or hematemesis.  The patient had surgery for varicose veins one month ago and she was taking Aleve and Tylenol consistently for two weeks.  She states that she takes Aleve, but it was not as frequent and she mostly uses Tylenol.  Today she used a BC powder for a headache, but she denies having frequent headaches.  Past Medical History:  Diagnosis Date  . Hypertension     No past surgical history on file.  Family History  Problem Relation Age of Onset  . Diabetes Mellitus II Mother   . Diabetes Mellitus II Father     Social History:  reports that she has never smoked. She has never used smokeless tobacco. She reports that she does not drink alcohol or use drugs.  Allergies: No Known Allergies  Medications:  Scheduled: . iopamidol      . iopamidol       Continuous: . sodium chloride      Results for orders placed or performed during the hospital encounter of 04/02/18 (from the past 24 hour(s))  Type and screen [facility]     Status: None (Preliminary result)   Collection Time: 04/02/18  5:40 PM  Result Value Ref Range   ABO/RH(D) O POS    Antibody Screen NEG    Sample Expiration 04/05/2018    Unit Number [id number]    Blood Component Type RED CELLS,LR    Unit division 00    Status of Unit ISSUED    Transfusion Status OK TO TRANSFUSE    Crossmatch  Result Compatible    Unit Number [id number]    Blood Component Type RED CELLS,LR    Unit division 00    Status of Unit ISSUED    Transfusion Status OK TO TRANSFUSE    Crossmatch Result      Compatible Performed at [facility], [address] [place], [place] [address]    Unit Number [id number]    Blood Component Type RBC LR PHER1    Unit division 00    Status of Unit ALLOCATED    Transfusion Status OK TO TRANSFUSE    Crossmatch Result Compatible    Unit Number [id number]    Blood Component Type RED CELLS,LR    Unit division 00    Status of Unit ALLOCATED    Transfusion Status OK TO TRANSFUSE    Crossmatch Result Compatible   Comprehensive metabolic panel     Status: Abnormal   Collection Time: 04/02/18  5:45 PM  Result Value Ref Range   Sodium 137 135 - 145 mmol/L   Potassium 3.7 3.5 - 5.1 mmol/L   Chloride 105 98 - 111 mmol/L   CO2 24 22 - 32 mmol/L   Glucose, Bld 136 (H) 70 - 99 mg/dL   BUN 11 8 - 23 mg/dL   Creatinine, Ser 7.42 0.44 -  1.00 mg/dL   Calcium 8.6 (L) 8.9 - 10.3 mg/dL   Total Protein 7.4 6.5 - 8.1 g/dL   Albumin 3.4 (L) 3.5 - 5.0 g/dL   AST 20 15 - 41 U/L   ALT 15 0 - 44 U/L   Alkaline Phosphatase 59 38 - 126 U/L   Total Bilirubin 0.7 0.3 - 1.2 mg/dL   GFR calc non Af Amer >60 >60 mL/min   GFR calc Af Amer >60 >60 mL/min   Anion gap 8 5 - 15  CBC     Status: Abnormal   Collection Time: 04/02/18  5:45 PM  Result Value Ref Range   WBC 7.7 4.0 - 10.5 K/uL   RBC 4.39 3.87 - 5.11 MIL/uL   Hemoglobin 10.4 (L) 12.0 - 15.0 g/dL   HCT 16.1 (L) 09.6 - 04.5 %   MCV 80.9 78.0 - 100.0 fL   MCH 23.7 (L) 26.0 - 34.0 pg   MCHC 29.3 (L) 30.0 - 36.0 g/dL   RDW 40.9 81.1 - 91.4 %   Platelets 311 150 - 400 K/uL  Prepare RBC (crossmatch)     Status: None   Collection Time: 04/02/18  7:53 PM  Result Value Ref Range   Order Confirmation      ORDER PROCESSED BY BLOOD BANK Performed at [facility], [address] [place], [place] [address]    Prepare RBC     Status: None   Collection Time: 04/02/18  8:06 PM  Result Value Ref Range   Order Confirmation      BB SAMPLE OR UNITS ALREADY AVAILABLE Performed at [facility], [address] [place], [place] [address]   Protime-INR     Status: None   Collection Time: 04/02/18  8:21 PM  Result Value Ref Range   Prothrombin Time 14.7 11.4 - 15.2 seconds   INR 1.16   CBC     Status: Abnormal   Collection Time: 04/02/18  8:21 PM  Result Value Ref Range   WBC 7.4 4.0 - 10.5 K/uL   RBC 3.96 3.87 - 5.11 MIL/uL   Hemoglobin 9.5 (L) 12.0 - 15.0 g/dL   HCT 86.5 (L) 78.4 - 69.6 %   MCV 81.3 78.0 - 100.0 fL   MCH 24.0 (L) 26.0 - 34.0 pg   MCHC 29.5 (L) 30.0 - 36.0 g/dL   RDW 29.5 28.4 - 13.2 %   Platelets 257 150 - 400 K/uL  Prepare RBC     Status: None   Collection Time: 04/02/18  9:50 PM  Result Value Ref Range   Order Confirmation      ORDER PROCESSED BY BLOOD BANK Performed at [facility], [address], [place], [place] [address]      No results found.  ROS:  As stated above in the HPI otherwise negative.  Blood pressure (!) 93/55, pulse (!) 44, temperature 98 F (36.7 C), temperature source Oral, resp. rate (!) 23, height 5\' 7"  (1.702 m), weight 88 kg, SpO2 100 %.    PE: Gen: NAD, Alert and Oriented HEENT:  [place]/AT, EOMI Neck: Supple, no LAD Lungs: CTA Bilaterally CV: RRR without M/G/R ABM: Soft, NTND, +BS Ext: No C/C/E  Assessment/Plan: 1) Hematochezia. 2) Anemia. 3) Hypotension.   The clinical presentation is consistent with a diverticular bleed, however, she does have a recent history of NSAID use.  NSAID use coupled with hypotension and hematochezia can point to an upper GI source.  At the time  of the evaluation her BP was better, but still in the 90's and low 100's.  A CT angio is the better test to obtain at this time since she has such significant bleeding.  A NG lavage was requested, but she was too weak at the time of the request, but  another attempt will be made if the CT scan is negative for a source of bleeding.    Plan: 1) Await CT angio report. 2) If CT is negative and NG tube lavage will be necessary. 3) Follow HGB and transfuse as necessary. 4) Admit to ICU or stepdown.  ADDENDUM:  On 01/09/2008 she underwent an EGD and colonoscopy with Dr. Virginia Rochesterrr.  The EGD showed a hiatal hernia and the colonoscopy was significant for a mild pandiverticulosis.  Sai Zinn D 04/02/2018, 10:54 PM

## 2018-04-02 NOTE — ED Notes (Signed)
Pt pale, diaphoretic, appears restless, states she thinks she needs to have another BM, bed pan placed under bottom

## 2018-04-02 NOTE — ED Notes (Signed)
Pt not able to get NG tube at this time, fatigued and weak, cannot hold self up in bed to get NG tube place; pt continues to be restless, denies pain, just states she is uncomfortable

## 2018-04-03 DIAGNOSIS — K625 Hemorrhage of anus and rectum: Secondary | ICD-10-CM

## 2018-04-03 DIAGNOSIS — D62 Acute posthemorrhagic anemia: Principal | ICD-10-CM

## 2018-04-03 DIAGNOSIS — J45902 Unspecified asthma with status asthmaticus: Secondary | ICD-10-CM

## 2018-04-03 DIAGNOSIS — I959 Hypotension, unspecified: Secondary | ICD-10-CM

## 2018-04-03 DIAGNOSIS — K922 Gastrointestinal hemorrhage, unspecified: Secondary | ICD-10-CM

## 2018-04-03 DIAGNOSIS — E559 Vitamin D deficiency, unspecified: Secondary | ICD-10-CM

## 2018-04-03 HISTORY — DX: Gastrointestinal hemorrhage, unspecified: K92.2

## 2018-04-03 LAB — BASIC METABOLIC PANEL
Anion gap: 5 (ref 5–15)
BUN: 13 mg/dL (ref 8–23)
CALCIUM: 8.1 mg/dL — AB (ref 8.9–10.3)
CO2: 24 mmol/L (ref 22–32)
CREATININE: 0.68 mg/dL (ref 0.44–1.00)
Chloride: 111 mmol/L (ref 98–111)
Glucose, Bld: 124 mg/dL — ABNORMAL HIGH (ref 70–99)
Potassium: 3.9 mmol/L (ref 3.5–5.1)
SODIUM: 140 mmol/L (ref 135–145)

## 2018-04-03 LAB — HEMOGLOBIN
HEMOGLOBIN: 10.2 g/dL — AB (ref 12.0–15.0)
Hemoglobin: 9.2 g/dL — ABNORMAL LOW (ref 12.0–15.0)

## 2018-04-03 LAB — HEMATOCRIT
HCT: 29.1 % — ABNORMAL LOW (ref 36.0–46.0)
HCT: 33 % — ABNORMAL LOW (ref 36.0–46.0)

## 2018-04-03 LAB — MRSA PCR SCREENING: MRSA by PCR: NEGATIVE

## 2018-04-03 MED ORDER — PEG 3350-KCL-NA BICARB-NACL 420 G PO SOLR
4000.0000 mL | Freq: Once | ORAL | Status: DC
  Filled 2018-04-03: qty 4000

## 2018-04-03 MED ORDER — SODIUM CHLORIDE 0.9 % IV BOLUS
500.0000 mL | Freq: Once | INTRAVENOUS | Status: AC
  Administered 2018-04-03: 500 mL via INTRAVENOUS

## 2018-04-03 MED ORDER — DEXTROSE-NACL 5-0.9 % IV SOLN
INTRAVENOUS | Status: DC
  Administered 2018-04-03 – 2018-04-05 (×4): via INTRAVENOUS

## 2018-04-03 NOTE — Progress Notes (Addendum)
PROGRESS NOTE    Amy Beard  ZOX:[id number] DOB: 1955-10-28 DOA: 04/02/2018 PCP: Pearson Grippe, MD    Brief Narrative:  62 year old lady who presented with hematochezia, generalized weakness and lightheadedness.  She does have the significant past medical history for GERD. She reported episode of bright red blood per rectum move her bowels, but was recorded for about 5 times, associated with progressive generalized weakness and lightheadedness.  On her initial physical examination blood pressure was 78/52, she has pale with dry mucous membranes, lungs clear to auscultation bilaterally, heart S1-2 present rhythmic, abdomen soft nontender, no lower extremity edema.  Sodium 137, potassium 3.7, chloride 105, bicarb 24, glucose 136, BUN 11, creatinine 0.79, white count 7.7, hemoglobin 10.4, hct 35.5, platelets 311.  CT of the abdomen with colonic diverticulosis.  Patient was admitted to the hospital with working diagnosis of symptomatic anemia due to acute blood loss due to lower GI bleed.  Assessment & Plan:   Principal Problem:   Rectal bleeding Active Problems:   Acute blood loss anemia   Hypotension due to blood loss   Rectal bleed  1. Acute symptomatic anemia due to lower GI bleed, diverticular/ sp 2 units PRBC transfusion. Patient continue to be at high risk for worsening bleeding and anemia, her blood pressure is 101 systolic with HR [id number]. Will continue telemetry monitoring and Hb and Hct checks. Continue bid IV pantoprazole, patient scheduled for upper endoscopy and colonoscopy in am. Holding aspirin. Hb has dropped from 10.2 to 9,2 and hct from 33,0 to 29,0. Will continue IV fluids with isotonic saline plus dextrose at 75 ml per hour.   2. Asthma. Stable with no signs of exacerbation, will continue as needed albuterol.   3. Constipation. Stable, patient will have pre for colonoscopy.  4, Vitamin D deficiency. At home on 50,000 units weekly. Will need follow up as outpatient.     DVT prophylaxis: scd   Code Status:  full Family Communication: I spoke with patient's husband at the bedside and all questions were addressed.  Disposition Plan/ discharge barriers: pending GI workup    Consultants:   GI   Procedures:     Antimicrobials:       Subjective: This am with no nausea or vomiting, no chest pain or dyspnea. Has not noticed any blood per rectum. Patient was taking aspirin at home for primary atherosclerotic vascular disease prophylaxis.   Objective: Vitals:   04/03/18 0525 04/03/18 0648 04/03/18 0752 04/03/18 0811  BP: (!) 88/49  101/61   Pulse: 82     Resp: 20  15   Temp: 98.3 F (36.8 C) 98.4 F (36.9 C)  (!) 97.4 F (36.3 C)  TempSrc: Oral Oral  Oral  SpO2: 97%     Weight:  90.4 kg    Height:        Intake/Output Summary (Last 24 hours) at 04/03/2018 0942 Last data filed at 04/03/2018 0251 Gross per 24 hour  Intake 945 ml  Output 400 ml  Net 545 ml   Filed Weights   04/02/18 1807 04/03/18 0020 04/03/18 0648  Weight: 88 kg 88.5 kg 90.4 kg    Examination:   General: Not in pain or dyspnea, deconditioned  Neurology: Awake and alert, non focal  E ENT: mild pallor, no icterus, oral mucosa moist Cardiovascular: No JVD. S1-S2 present, rhythmic, no gallops, rubs, or murmurs. No lower extremity edema. Pulmonary: vesicular breath sounds bilaterally, adequate air movement, no wheezing, rhonchi or rales. Gastrointestinal. Abdomen distended no organomegaly,  non tender, no rebound or guarding Skin. No rashes Musculoskeletal: no joint deformities     Data Reviewed: I have personally reviewed following labs and imaging studies  CBC: Recent Labs  Lab 04/02/18 1745 04/02/18 2021 04/03/18 0029 04/03/18 0659  WBC 7.7 7.4  --   --   HGB 10.4* 9.5* 10.2* 9.2*  HCT 35.5* 32.2* 33.0* 29.1*  MCV 80.9 81.3  --   --   PLT 311 257  --   --    Basic Metabolic Panel: Recent Labs  Lab 04/02/18 1745 04/03/18 0029  NA 137 140  K 3.7  3.9  CL 105 111  CO2 24 24  GLUCOSE 136* 124*  BUN 11 13  CREATININE 0.79 0.68  CALCIUM 8.6* 8.1*   GFR: Estimated Creatinine Clearance: 85.2 mL/min (by C-G formula based on SCr of 0.68 mg/dL). Liver Function Tests: Recent Labs  Lab 04/02/18 1745  AST 20  ALT 15  ALKPHOS 59  BILITOT 0.7  PROT 7.4  ALBUMIN 3.4*   No results for input(s): LIPASE, AMYLASE in the last 168 hours. No results for input(s): AMMONIA in the last 168 hours. Coagulation Profile: Recent Labs  Lab 04/02/18 2021  INR 1.16   Cardiac Enzymes: No results for input(s): CKTOTAL, CKMB, CKMBINDEX, TROPONINI in the last 168 hours. BNP (last 3 results) No results for input(s): PROBNP in the last 8760 hours. HbA1C: No results for input(s): HGBA1C in the last 72 hours. CBG: No results for input(s): GLUCAP in the last 168 hours. Lipid Profile: No results for input(s): CHOL, HDL, LDLCALC, TRIG, CHOLHDL, LDLDIRECT in the last 72 hours. Thyroid Function Tests: No results for input(s): TSH, T4TOTAL, FREET4, T3FREE, THYROIDAB in the last 72 hours. Anemia Panel: No results for input(s): VITAMINB12, FOLATE, FERRITIN, TIBC, IRON, RETICCTPCT in the last 72 hours.    Radiology Studies: I have reviewed all of the imaging during this hospital visit personally     Scheduled Meds: . pantoprazole  40 mg Intravenous Q12H  . polyethylene glycol-electrolytes  4,000 mL Oral Once  . sodium chloride flush  3 mL Intravenous Q12H  . sodium chloride flush  3 mL Intravenous Q12H   Continuous Infusions: . sodium chloride    . sodium chloride 100 mL/hr at 04/03/18 0059     LOS: 1 day        Coralie Keens, MD [facility] Pager [phone]

## 2018-04-03 NOTE — Progress Notes (Signed)
Amy Beard [id number] Admitted to 6H60: 04/03/2018 6:39 AM Attending Provider: Zada Girt is a 62 y.o. female patient admitted from ED awake, alert  & orientated  X 3,  Full Code, no c/o shortness of breath, no c/o chest pain, no distress noted. Tele # [id number] placed.  IV site WDL: with a transparent dsg that's clean dry and intact.  Allergies:  No Known Allergies   Past Medical History:  Diagnosis Date  . Hypertension     History:  obtained from patient  Pt orientation to unit, room and routine. Information packet given to patient/family.  Admission INP armband ID verified with patient/family, and in place. SR up x 2, fall risk assessment complete with Patient and family verbalizing understanding of risks associated with falls. Pt verbalizes an understanding of how to use the call bell and to call for help before getting out of bed.  Skin, clean-dry- intact without evidence of bruising, or skin tears.   No evidence of skin break down noted on exam.  Will cont to monitor and assist as needed.  Elisha Ponder, RN 04/03/2018 6:39 AM

## 2018-04-03 NOTE — Progress Notes (Signed)
Subjective: Feeling better, but still weak.  Objective: Vital signs in last 24 hours: Temp:  [97.4 F (36.3 C)-98.7 F (37.1 C)] 97.4 F (36.3 C) (09/01 0811) Pulse Rate:  [25-103] 82 (09/01 0525) Resp:  [12-30] 15 (09/01 0752) BP: (78-115)/(43-92) 101/61 (09/01 0752) SpO2:  [56 %-100 %] 97 % (09/01 0525) Weight:  [88 kg-90.4 kg] 90.4 kg (09/01 0648) Last BM Date: 04/02/18  Intake/Output from previous day: 08/31 0701 - 09/01 0700 In: 945 [Blood:945] Out: 400 [Urine:400] Intake/Output this shift: No intake/output data recorded.  General appearance: alert and no distress GI: soft, non-tender; bowel sounds normal; no masses,  no organomegaly  Lab Results: Recent Labs    04/02/18 1745 04/02/18 2021 04/03/18 0029 04/03/18 0659  WBC 7.7 7.4  --   --   HGB 10.4* 9.5* 10.2* 9.2*  HCT 35.5* 32.2* 33.0* 29.1*  PLT 311 257  --   --    BMET Recent Labs    04/02/18 1745 04/03/18 0029  NA 137 140  K 3.7 3.9  CL 105 111  CO2 24 24  GLUCOSE 136* 124*  BUN 11 13  CREATININE 0.79 0.68  CALCIUM 8.6* 8.1*   LFT Recent Labs    04/02/18 1745  PROT 7.4  ALBUMIN 3.4*  AST 20  ALT 15  ALKPHOS 59  BILITOT 0.7   PT/INR Recent Labs    04/02/18 2021  LABPROT 14.7  INR 1.16   Hepatitis Panel No results for input(s): HEPBSAG, HCVAB, HEPAIGM, HEPBIGM in the last 72 hours. C-Diff No results for input(s): CDIFFTOX in the last 72 hours. Fecal Lactopherrin No results for input(s): FECLLACTOFRN in the last 72 hours.  Studies/Results: Ct Angio Abd/pel W And/or Wo Contrast  Result Date: 04/03/2018 CLINICAL DATA:  62 year old female with liquid loose stools. Rectal bleeding. EXAM: CTA ABDOMEN AND PELVIS WITH CONTRAST TECHNIQUE: Multidetector CT imaging of the abdomen and pelvis was performed using the standard protocol during bolus administration of intravenous contrast. Multiplanar reconstructed images and MIPs were obtained and reviewed to evaluate the vascular anatomy.  CONTRAST:  ISOVUE-370 IOPAMIDOL (ISOVUE-370) INJECTION 76% COMPARISON:  None. FINDINGS: VASCULAR Aorta: Normal caliber aorta without aneurysm, dissection, vasculitis or significant stenosis. Celiac: Patent without evidence of aneurysm, dissection, vasculitis or significant stenosis. SMA: Patent without evidence of aneurysm, dissection, vasculitis or significant stenosis. Renals: Both renal arteries are patent without evidence of aneurysm, dissection, vasculitis, fibromuscular dysplasia or significant stenosis. IMA: Patent without evidence of aneurysm, dissection, vasculitis or significant stenosis. Inflow: Patent without evidence of aneurysm, dissection, vasculitis or significant stenosis. Proximal Outflow: Bilateral common femoral and visualized portions of the superficial and profunda femoral arteries are patent without evidence of aneurysm, dissection, vasculitis or significant stenosis. Veins: No obvious venous abnormality within the limitations of this arterial phase study. Review of the MIP images confirms the above findings. NON-VASCULAR Lower chest: Mild bibasal subpleural thickening. The visualized lung bases are otherwise clear. There is a moderate-sized pericardial effusion measuring up to 2 cm in thickness. Correlation with echocardiogram recommended. There is no intra-abdominal free air or free fluid. Hepatobiliary: A 1 cm hypodense lesion in the right lobe of the liver is not well characterized but demonstrates fluid attenuation most likely representing a cyst. The liver is otherwise unremarkable. No intrahepatic biliary ductal dilatation. The gallbladder is unremarkable as well. Pancreas: Unremarkable. No pancreatic ductal dilatation or surrounding inflammatory changes. Spleen: Subcentimeter splenic hypodense lesion is not well characterized but may represent a cyst or hemangioma. Adrenals/Urinary Tract: The adrenal glands are  unremarkable. Small bilateral renal cysts and subcentimeter  hypodensities which are too small to characterize. The kidneys are otherwise unremarkable. There is symmetric enhancement of the kidneys. The visualized ureters and urinary bladder appear unremarkable. Stomach/Bowel: The stomach is moderately distended with ingested content. There are scattered colonic diverticula without active inflammatory changes. There is no bowel obstruction or active inflammation. Loose stool noted throughout the colon compatible with diarrheal state. There is apparent mucosal enhancement of the rectum most likely from prominent hemorrhoidal vessels or bleed. The appendix is normal. Lymphatic: No significant vascular findings are present. No enlarged abdominal or pelvic lymph nodes. Reproductive: The uterus is retroflexed. There is a 2.7 cm right uterine calcified fibroid. A 15 mm fatty lesion in the region of the right adnexa most consistent with dermoid. The left ovary is unremarkable. Other: None Musculoskeletal: There is degenerative changes of the spine. Grade 1 L4-L5 and L5-S1 retrolisthesis. No acute osseous pathology. IMPRESSION: VASCULAR No acute findings.  No significant vascular pathology. NON-VASCULAR 1. Probable prominent hemorrhoidal vessels with associated minimal bleed at the rectum. Diarrheal state. 2. Colonic diverticulosis. No bowel obstruction or active inflammation. Normal appendix. 3. A 1.5 cm right ovarian dermoid. Electronically Signed   By: Elgie Collard M.D.   On: 04/03/2018 00:00    Medications:  Scheduled: . iopamidol      . pantoprazole  40 mg Intravenous Q12H  . sodium chloride flush  3 mL Intravenous Q12H  . sodium chloride flush  3 mL Intravenous Q12H   Continuous: . sodium chloride    . sodium chloride 100 mL/hr at 04/03/18 0059    Assessment/Plan: 1) Probable diverticular bleed. 2) Anemia.   Clinically she reports feeling better, but there is still a component of weakness.  Her HGB is stable and she denies any further bleeding overnight.   The CT angio was negative for any overt bleeding source.  A EGD/colonoscopy was discussed with the patient for tomorrow.  She is hemodynamically stable, but there is some lower blood pressures.  The plan is to attempt the procedure tomorrow, but if she feels to weak or if hypotension recurs consistently the procedure will not be performed.  Plan: 1) EGD/Colonoscopy tomorrow.  LOS: 1 day   Lemond Griffee D 04/03/2018, 8:14 AM

## 2018-04-03 NOTE — Progress Notes (Signed)
Blood pressure is better, but it is still in the low 100's.  Clinically she looks better, but still very weak.  The EGD/Colonoscopy will be cancelled.  No further bleeding since last evening.

## 2018-04-04 ENCOUNTER — Encounter (HOSPITAL_COMMUNITY)
Admission: EM | Disposition: A | Discharge status: Home / Self Care | Payer: Self-pay | Source: Home / Self Care | Attending: Internal Medicine

## 2018-04-04 ENCOUNTER — Encounter (HOSPITAL_COMMUNITY): Payer: Self-pay | Admitting: General Practice

## 2018-04-04 DIAGNOSIS — J45909 Unspecified asthma, uncomplicated: Secondary | ICD-10-CM

## 2018-04-04 LAB — CBC WITH DIFFERENTIAL/PLATELET
ABS IMMATURE GRANULOCYTES: 0 10*3/uL (ref 0.0–0.1)
BASOS ABS: 0 10*3/uL (ref 0.0–0.1)
BASOS PCT: 1 %
EOS ABS: 0.2 10*3/uL (ref 0.0–0.7)
Eosinophils Relative: 3 %
HEMATOCRIT: 28 % — AB (ref 36.0–46.0)
Hemoglobin: 8.5 g/dL — ABNORMAL LOW (ref 12.0–15.0)
IMMATURE GRANULOCYTES: 0 %
Lymphocytes Relative: 31 %
Lymphs Abs: 1.9 10*3/uL (ref 0.7–4.0)
MCH: 25 pg — AB (ref 26.0–34.0)
MCHC: 30.4 g/dL (ref 30.0–36.0)
MCV: 82.4 fL (ref 78.0–100.0)
Monocytes Absolute: 0.6 10*3/uL (ref 0.1–1.0)
Monocytes Relative: 10 %
NEUTROS ABS: 3.4 10*3/uL (ref 1.7–7.7)
NEUTROS PCT: 55 %
Platelets: 191 10*3/uL (ref 150–400)
RBC: 3.4 MIL/uL — AB (ref 3.87–5.11)
RDW: 15.1 % (ref 11.5–15.5)
WBC: 6.1 10*3/uL (ref 4.0–10.5)

## 2018-04-04 LAB — BASIC METABOLIC PANEL
ANION GAP: 7 (ref 5–15)
BUN: <5 mg/dL — ABNORMAL LOW (ref 8–23)
CALCIUM: 8.4 mg/dL — AB (ref 8.9–10.3)
CO2: 26 mmol/L (ref 22–32)
CREATININE: 0.62 mg/dL (ref 0.44–1.00)
Chloride: 111 mmol/L (ref 98–111)
GFR calc Af Amer: >60 mL/min (ref >60)
GFR calc non Af Amer: >60 mL/min (ref >60)
GLUCOSE: 95 mg/dL (ref 70–99)
Potassium: 3.6 mmol/L (ref 3.5–5.1)
Sodium: 144 mmol/L (ref 135–145)

## 2018-04-04 SURGERY — EGD (ESOPHAGOGASTRODUODENOSCOPY)
Anesthesia: Moderate Sedation

## 2018-04-04 MED ORDER — PEG 3350-KCL-NA BICARB-NACL 420 G PO SOLR
4000.0000 mL | Freq: Once | ORAL | Status: AC
  Administered 2018-04-04: 4000 mL via ORAL
  Filled 2018-04-04: qty 4000

## 2018-04-04 NOTE — H&P (View-Only) (Signed)
Subjective: Feeling much better.  No further weakness or dizziness.  Objective: Vital signs in last 24 hours: Temp:  [97.6 F (36.4 C)-98.3 F (36.8 C)] 98 F (36.7 C) (09/02 0729) Pulse Rate:  [76-79] 79 (09/02 0440) Resp:  [20-21] 20 (09/02 0440) BP: (112-118)/(72-92) 118/72 (09/02 0440) SpO2:  [98 %] 98 % (09/02 0440) Weight:  [88.7 kg] 88.7 kg (09/02 0433) Last BM Date: 04/02/18  Intake/Output from previous day: 09/01 0701 - 09/02 0700 In: 683.5 [P.O.:480; I.V.:203.5] Out: -  Intake/Output this shift: Total I/O In: -  Out: 1200 [Urine:1200]  General appearance: alert and no distress GI: soft, non-tender; bowel sounds normal; no masses,  no organomegaly  Lab Results: Recent Labs    04/02/18 1745 04/02/18 2021 04/03/18 0029 04/03/18 0659 04/04/18 0406  WBC 7.7 7.4  --   --  6.1  HGB 10.4* 9.5* 10.2* 9.2* 8.5*  HCT 35.5* 32.2* 33.0* 29.1* 28.0*  PLT 311 257  --   --  191   BMET Recent Labs    04/02/18 1745 04/03/18 0029 04/04/18 0406  NA 137 140 144  K 3.7 3.9 3.6  CL 105 111 111  CO2 24 24 26  GLUCOSE 136* 124* 95  BUN 11 13 <5*  CREATININE 0.79 0.68 0.62  CALCIUM 8.6* 8.1* 8.4*   LFT Recent Labs    04/02/18 1745  PROT 7.4  ALBUMIN 3.4*  AST 20  ALT 15  ALKPHOS 59  BILITOT 0.7   PT/INR Recent Labs    04/02/18 2021  LABPROT 14.7  INR 1.16   Hepatitis Panel No results for input(s): HEPBSAG, HCVAB, HEPAIGM, HEPBIGM in the last 72 hours. C-Diff No results for input(s): CDIFFTOX in the last 72 hours. Fecal Lactopherrin No results for input(s): FECLLACTOFRN in the last 72 hours.  Studies/Results: Ct Angio Abd/pel W And/or Wo Contrast  Result Date: 04/03/2018 CLINICAL DATA:  62-year-old female with liquid loose stools. Rectal bleeding. EXAM: CTA ABDOMEN AND PELVIS WITH CONTRAST TECHNIQUE: Multidetector CT imaging of the abdomen and pelvis was performed using the standard protocol during bolus administration of intravenous contrast.  Multiplanar reconstructed images and MIPs were obtained and reviewed to evaluate the vascular anatomy. CONTRAST:  100mL ISOVUE-370 IOPAMIDOL (ISOVUE-370) INJECTION 76% COMPARISON:  None. FINDINGS: VASCULAR Aorta: Normal caliber aorta without aneurysm, dissection, vasculitis or significant stenosis. Celiac: Patent without evidence of aneurysm, dissection, vasculitis or significant stenosis. SMA: Patent without evidence of aneurysm, dissection, vasculitis or significant stenosis. Renals: Both renal arteries are patent without evidence of aneurysm, dissection, vasculitis, fibromuscular dysplasia or significant stenosis. IMA: Patent without evidence of aneurysm, dissection, vasculitis or significant stenosis. Inflow: Patent without evidence of aneurysm, dissection, vasculitis or significant stenosis. Proximal Outflow: Bilateral common femoral and visualized portions of the superficial and profunda femoral arteries are patent without evidence of aneurysm, dissection, vasculitis or significant stenosis. Veins: No obvious venous abnormality within the limitations of this arterial phase study. Review of the MIP images confirms the above findings. NON-VASCULAR Lower chest: Mild bibasal subpleural thickening. The visualized lung bases are otherwise clear. There is a moderate-sized pericardial effusion measuring up to 2 cm in thickness. Correlation with echocardiogram recommended. There is no intra-abdominal free air or free fluid. Hepatobiliary: A 1 cm hypodense lesion in the right lobe of the liver is not well characterized but demonstrates fluid attenuation most likely representing a cyst. The liver is otherwise unremarkable. No intrahepatic biliary ductal dilatation. The gallbladder is unremarkable as well. Pancreas: Unremarkable. No pancreatic ductal dilatation or surrounding inflammatory   changes. Spleen: Subcentimeter splenic hypodense lesion is not well characterized but may represent a cyst or hemangioma.  Adrenals/Urinary Tract: The adrenal glands are unremarkable. Small bilateral renal cysts and subcentimeter hypodensities which are too small to characterize. The kidneys are otherwise unremarkable. There is symmetric enhancement of the kidneys. The visualized ureters and urinary bladder appear unremarkable. Stomach/Bowel: The stomach is moderately distended with ingested content. There are scattered colonic diverticula without active inflammatory changes. There is no bowel obstruction or active inflammation. Loose stool noted throughout the colon compatible with diarrheal state. There is apparent mucosal enhancement of the rectum most likely from prominent hemorrhoidal vessels or bleed. The appendix is normal. Lymphatic: No significant vascular findings are present. No enlarged abdominal or pelvic lymph nodes. Reproductive: The uterus is retroflexed. There is a 2.7 cm right uterine calcified fibroid. A 15 mm fatty lesion in the region of the right adnexa most consistent with dermoid. The left ovary is unremarkable. Other: None Musculoskeletal: There is degenerative changes of the spine. Grade 1 L4-L5 and L5-S1 retrolisthesis. No acute osseous pathology. IMPRESSION: VASCULAR No acute findings.  No significant vascular pathology. NON-VASCULAR 1. Probable prominent hemorrhoidal vessels with associated minimal bleed at the rectum. Diarrheal state. 2. Colonic diverticulosis. No bowel obstruction or active inflammation. Normal appendix. 3. A 1.5 cm right ovarian dermoid. Electronically Signed   By: Arash  Radparvar M.D.   On: 04/03/2018 00:00    Medications:  Scheduled: . pantoprazole  40 mg Intravenous Q12H  . sodium chloride flush  3 mL Intravenous Q12H  . sodium chloride flush  3 mL Intravenous Q12H   Continuous: . sodium chloride    . dextrose 5 % and 0.9% NaCl 75 mL/hr at 04/04/18 0430    Assessment/Plan: 1) Probable diverticular bleed. 2) Anemia.   The patient is clinically much better.  Her blood  pressure is within the normotensive range.  She is now able to tolerate the prep for the colonoscopy.  An EGD will also be performed as she as a recent history of NSAID use.  Plan: 1) EGD/Colonoscopy with Dr. Danis tomorrow.  LOS: 2 days   Shawnta Schlegel D 04/04/2018, 11:18 AM 

## 2018-04-04 NOTE — Progress Notes (Signed)
[facility]  PROGRESS NOTE    Amy Beard  ZOX:[id number] DOB: [birth year] DOA: 04/02/2018 PCP: Pearson Grippe, MD    Brief Narrative:  62 year old lady who presented with hematochezia, generalized weakness and lightheadedness.  She does have the significant past medical history for GERD. She reported episode of bright red blood per rectum move her bowels, but was recorded for about 5 times, associated with progressive generalized weakness and lightheadedness.  On her initial physical examination blood pressure was 78/52, she has pale with dry mucous membranes, lungs clear to auscultation bilaterally, heart S1-2 present rhythmic, abdomen soft nontender, no lower extremity edema.  Sodium 137, potassium 3.7, chloride 105, bicarb 24, glucose 136, BUN 11, creatinine 0.79, white count 7.7, hemoglobin 10.4, hct 35.5, platelets 311.  CT of the abdomen with colonic diverticulosis.  Patient was admitted to the hospital with working diagnosis of symptomatic anemia due to acute blood loss due to lower GI bleed.   Assessment & Plan:   Principal Problem:   Rectal bleeding Active Problems:   Acute blood loss anemia   Hypotension due to blood loss   Rectal bleed    1. Acute symptomatic anemia due to lower GI bleed, diverticular/ sp 2 units PRBC transfusion. High risk for worsening bleeding and hemodynamic decompensation, will continue telemetry monitoring and IV fluids with isotonic saline. Blood pressure continue to be in the low 100's with HR 80's. Hb has dropped to 8.5 from peck at 10.4 on admission. Follow on Hb and hct in am. Patient continue to require inpatient status due to high risk of worsening anemia that may require repeat  prbc transfusion. Continue IV pantoprazole bid.   2. Asthma. No clinical signs of exacerbation, albuterol as needed.   3. Constipation. Will have colonic prep today.   4, Vitamin D deficiency.  50,000 units weekly. Clinically asymptomatic.   DVT prophylaxis: scd   Code  Status:  full Family Communication: I spoke with patient's husband at the bedside and all questions were addressed.  Disposition Plan/ discharge barriers: pending GI workup    Consultants:   GI   Procedures:     Antimicrobials:      Subjective: Patient with low blood pressure, no nausea or vomiting, no hematochezia or melena. No chest pain or dyspnea.   Objective: Vitals:   04/04/18 0015 04/04/18 0433 04/04/18 0440 04/04/18 0729  BP: 113/73  118/72   Pulse: 76  79   Resp: (!) 21  20   Temp: 98.3 F (36.8 C) 97.6 F (36.4 C)  98 F (36.7 C)  TempSrc: Oral Oral  Oral  SpO2: 98%  98%   Weight:  88.7 kg    Height:        Intake/Output Summary (Last 24 hours) at 04/04/2018 1117 Last data filed at 04/04/2018 0735 Gross per 24 hour  Intake 683.51 ml  Output 1200 ml  Net -516.49 ml   Filed Weights   04/03/18 0020 04/03/18 0648 04/04/18 0433  Weight: 88.5 kg 90.4 kg 88.7 kg    Examination:   General: Not in pain or dyspnea, deconditioned  Neurology: Awake and alert, non focal  E ENT: mild pallor, no icterus, oral mucosa moist Cardiovascular: No JVD. S1-S2 present, rhythmic, no gallops, rubs, or murmurs. No lower extremity edema. Pulmonary: vesicular breath sounds bilaterally, adequate air movement, no wheezing, rhonchi or rales. Gastrointestinal. Abdomen with no organomegaly, non tender, no rebound or guarding Skin. No rashes Musculoskeletal: no joint deformities     Data Reviewed: I have  personally reviewed following labs and imaging studies  CBC: Recent Labs  Lab 04/02/18 1745 04/02/18 2021 04/03/18 0029 04/03/18 0659 04/04/18 0406  WBC 7.7 7.4  --   --  6.1  NEUTROABS  --   --   --   --  3.4  HGB 10.4* 9.5* 10.2* 9.2* 8.5*  HCT 35.5* 32.2* 33.0* 29.1* 28.0*  MCV 80.9 81.3  --   --  82.4  PLT 311 257  --   --  191   Basic Metabolic Panel: Recent Labs  Lab 04/02/18 1745 04/03/18 0029 04/04/18 0406  NA 137 140 144  K 3.7 3.9 3.6  CL  105 111 111  CO2 24 24 26   GLUCOSE 136* 124* 95  BUN 11 13 <5*  CREATININE 0.79 0.68 0.62  CALCIUM 8.6* 8.1* 8.4*   GFR: Estimated Creatinine Clearance: 84.4 mL/min (by C-G formula based on SCr of 0.62 mg/dL). Liver Function Tests: Recent Labs  Lab 04/02/18 1745  AST 20  ALT 15  ALKPHOS 59  BILITOT 0.7  PROT 7.4  ALBUMIN 3.4*   No results for input(s): LIPASE, AMYLASE in the last 168 hours. No results for input(s): AMMONIA in the last 168 hours. Coagulation Profile: Recent Labs  Lab 04/02/18 2021  INR 1.16   Cardiac Enzymes: No results for input(s): CKTOTAL, CKMB, CKMBINDEX, TROPONINI in the last 168 hours. BNP (last 3 results) No results for input(s): PROBNP in the last 8760 hours. HbA1C: No results for input(s): HGBA1C in the last 72 hours. CBG: No results for input(s): GLUCAP in the last 168 hours. Lipid Profile: No results for input(s): CHOL, HDL, LDLCALC, TRIG, CHOLHDL, LDLDIRECT in the last 72 hours. Thyroid Function Tests: No results for input(s): TSH, T4TOTAL, FREET4, T3FREE, THYROIDAB in the last 72 hours. Anemia Panel: No results for input(s): VITAMINB12, FOLATE, FERRITIN, TIBC, IRON, RETICCTPCT in the last 72 hours.    Radiology Studies: I have reviewed all of the imaging during this hospital visit personally     Scheduled Meds: . pantoprazole  40 mg Intravenous Q12H  . sodium chloride flush  3 mL Intravenous Q12H  . sodium chloride flush  3 mL Intravenous Q12H   Continuous Infusions: . sodium chloride    . dextrose 5 % and 0.9% NaCl 75 mL/hr at 04/04/18 0430     LOS: 2 days        Coralie Keens, MD [facility] Pager [phone]

## 2018-04-04 NOTE — Progress Notes (Signed)
Subjective: Feeling much better.  No further weakness or dizziness.  Objective: Vital signs in last 24 hours: Temp:  [97.6 F (36.4 C)-98.3 F (36.8 C)] 98 F (36.7 C) (09/02 0729) Pulse Rate:  [76-79] 79 (09/02 0440) Resp:  [20-21] 20 (09/02 0440) BP: (112-118)/(72-92) 118/72 (09/02 0440) SpO2:  [98 %] 98 % (09/02 0440) Weight:  [88.7 kg] 88.7 kg (09/02 0433) Last BM Date: 04/02/18  Intake/Output from previous day: 09/01 0701 - 09/02 0700 In: 683.5 [P.O.:480; I.V.:203.5] Out: -  Intake/Output this shift: Total I/O In: -  Out: 1200 [Urine:1200]  General appearance: alert and no distress GI: soft, non-tender; bowel sounds normal; no masses,  no organomegaly  Lab Results: Recent Labs    04/02/18 1745 04/02/18 2021 04/03/18 0029 04/03/18 0659 04/04/18 0406  WBC 7.7 7.4  --   --  6.1  HGB 10.4* 9.5* 10.2* 9.2* 8.5*  HCT 35.5* 32.2* 33.0* 29.1* 28.0*  PLT 311 257  --   --  191   BMET Recent Labs    04/02/18 1745 04/03/18 0029 04/04/18 0406  NA 137 140 144  K 3.7 3.9 3.6  CL 105 111 111  CO2 24 24 26   GLUCOSE 136* 124* 95  BUN 11 13 <5*  CREATININE 0.79 0.68 0.62  CALCIUM 8.6* 8.1* 8.4*   LFT Recent Labs    04/02/18 1745  PROT 7.4  ALBUMIN 3.4*  AST 20  ALT 15  ALKPHOS 59  BILITOT 0.7   PT/INR Recent Labs    04/02/18 2021  LABPROT 14.7  INR 1.16   Hepatitis Panel No results for input(s): HEPBSAG, HCVAB, HEPAIGM, HEPBIGM in the last 72 hours. C-Diff No results for input(s): CDIFFTOX in the last 72 hours. Fecal Lactopherrin No results for input(s): FECLLACTOFRN in the last 72 hours.  Studies/Results: Ct Angio Abd/pel W And/or Wo Contrast  Result Date: 04/03/2018 CLINICAL DATA:  62 year old female with liquid loose stools. Rectal bleeding. EXAM: CTA ABDOMEN AND PELVIS WITH CONTRAST TECHNIQUE: Multidetector CT imaging of the abdomen and pelvis was performed using the standard protocol during bolus administration of intravenous contrast.  Multiplanar reconstructed images and MIPs were obtained and reviewed to evaluate the vascular anatomy. CONTRAST:  ISOVUE-370 IOPAMIDOL (ISOVUE-370) INJECTION 76% COMPARISON:  None. FINDINGS: VASCULAR Aorta: Normal caliber aorta without aneurysm, dissection, vasculitis or significant stenosis. Celiac: Patent without evidence of aneurysm, dissection, vasculitis or significant stenosis. SMA: Patent without evidence of aneurysm, dissection, vasculitis or significant stenosis. Renals: Both renal arteries are patent without evidence of aneurysm, dissection, vasculitis, fibromuscular dysplasia or significant stenosis. IMA: Patent without evidence of aneurysm, dissection, vasculitis or significant stenosis. Inflow: Patent without evidence of aneurysm, dissection, vasculitis or significant stenosis. Proximal Outflow: Bilateral common femoral and visualized portions of the superficial and profunda femoral arteries are patent without evidence of aneurysm, dissection, vasculitis or significant stenosis. Veins: No obvious venous abnormality within the limitations of this arterial phase study. Review of the MIP images confirms the above findings. NON-VASCULAR Lower chest: Mild bibasal subpleural thickening. The visualized lung bases are otherwise clear. There is a moderate-sized pericardial effusion measuring up to 2 cm in thickness. Correlation with echocardiogram recommended. There is no intra-abdominal free air or free fluid. Hepatobiliary: A 1 cm hypodense lesion in the right lobe of the liver is not well characterized but demonstrates fluid attenuation most likely representing a cyst. The liver is otherwise unremarkable. No intrahepatic biliary ductal dilatation. The gallbladder is unremarkable as well. Pancreas: Unremarkable. No pancreatic ductal dilatation or surrounding inflammatory  changes. Spleen: Subcentimeter splenic hypodense lesion is not well characterized but may represent a cyst or hemangioma.  Adrenals/Urinary Tract: The adrenal glands are unremarkable. Small bilateral renal cysts and subcentimeter hypodensities which are too small to characterize. The kidneys are otherwise unremarkable. There is symmetric enhancement of the kidneys. The visualized ureters and urinary bladder appear unremarkable. Stomach/Bowel: The stomach is moderately distended with ingested content. There are scattered colonic diverticula without active inflammatory changes. There is no bowel obstruction or active inflammation. Loose stool noted throughout the colon compatible with diarrheal state. There is apparent mucosal enhancement of the rectum most likely from prominent hemorrhoidal vessels or bleed. The appendix is normal. Lymphatic: No significant vascular findings are present. No enlarged abdominal or pelvic lymph nodes. Reproductive: The uterus is retroflexed. There is a 2.7 cm right uterine calcified fibroid. A 15 mm fatty lesion in the region of the right adnexa most consistent with dermoid. The left ovary is unremarkable. Other: None Musculoskeletal: There is degenerative changes of the spine. Grade 1 L4-L5 and L5-S1 retrolisthesis. No acute osseous pathology. IMPRESSION: VASCULAR No acute findings.  No significant vascular pathology. NON-VASCULAR 1. Probable prominent hemorrhoidal vessels with associated minimal bleed at the rectum. Diarrheal state. 2. Colonic diverticulosis. No bowel obstruction or active inflammation. Normal appendix. 3. A 1.5 cm right ovarian dermoid. Electronically Signed   By: Elgie Collard M.D.   On: 04/03/2018 00:00    Medications:  Scheduled: . pantoprazole  40 mg Intravenous Q12H  . sodium chloride flush  3 mL Intravenous Q12H  . sodium chloride flush  3 mL Intravenous Q12H   Continuous: . sodium chloride    . dextrose 5 % and 0.9% NaCl 75 mL/hr at 04/04/18 0430    Assessment/Plan: 1) Probable diverticular bleed. 2) Anemia.   The patient is clinically much better.  Her blood  pressure is within the normotensive range.  She is now able to tolerate the prep for the colonoscopy.  An EGD will also be performed as she as a recent history of NSAID use.  Plan: 1) EGD/Colonoscopy with Dr. Myrtie Neither tomorrow.  LOS: 2 days   Teonia Yager D 04/04/2018, 11:18 AM

## 2018-04-05 ENCOUNTER — Encounter (HOSPITAL_COMMUNITY): Payer: Self-pay | Admitting: Anesthesiology

## 2018-04-05 ENCOUNTER — Inpatient Hospital Stay (HOSPITAL_COMMUNITY): Payer: PRIVATE HEALTH INSURANCE | Admitting: Anesthesiology

## 2018-04-05 ENCOUNTER — Encounter (HOSPITAL_COMMUNITY)
Admission: EM | Disposition: A | Discharge status: Home / Self Care | Payer: Self-pay | Source: Home / Self Care | Attending: Internal Medicine

## 2018-04-05 DIAGNOSIS — K5730 Diverticulosis of large intestine without perforation or abscess without bleeding: Secondary | ICD-10-CM

## 2018-04-05 DIAGNOSIS — K449 Diaphragmatic hernia without obstruction or gangrene: Secondary | ICD-10-CM

## 2018-04-05 DIAGNOSIS — K5790 Diverticulosis of intestine, part unspecified, without perforation or abscess without bleeding: Secondary | ICD-10-CM

## 2018-04-05 DIAGNOSIS — K921 Melena: Secondary | ICD-10-CM

## 2018-04-05 HISTORY — PX: ESOPHAGOGASTRODUODENOSCOPY (EGD) WITH PROPOFOL: SHX5813

## 2018-04-05 HISTORY — PX: COLONOSCOPY WITH PROPOFOL: SHX5780

## 2018-04-05 LAB — CBC WITH DIFFERENTIAL/PLATELET
ABS IMMATURE GRANULOCYTES: 0 10*3/uL (ref 0.0–0.1)
BASOS ABS: 0 10*3/uL (ref 0.0–0.1)
BASOS PCT: 0 %
EOS ABS: 0.2 10*3/uL (ref 0.0–0.7)
EOS PCT: 3 %
HEMATOCRIT: 28.7 % — AB (ref 36.0–46.0)
Hemoglobin: 8.8 g/dL — ABNORMAL LOW (ref 12.0–15.0)
Immature Granulocytes: 0 %
LYMPHS PCT: 21 %
Lymphs Abs: 1.4 10*3/uL (ref 0.7–4.0)
MCH: 25.2 pg — ABNORMAL LOW (ref 26.0–34.0)
MCHC: 30.7 g/dL (ref 30.0–36.0)
MCV: 82.2 fL (ref 78.0–100.0)
Monocytes Absolute: 0.7 10*3/uL (ref 0.1–1.0)
Monocytes Relative: 10 %
Neutro Abs: 4.6 10*3/uL (ref 1.7–7.7)
Neutrophils Relative %: 66 %
Platelets: 221 10*3/uL (ref 150–400)
RBC: 3.49 MIL/uL — AB (ref 3.87–5.11)
RDW: 15 % (ref 11.5–15.5)
WBC: 6.9 10*3/uL (ref 4.0–10.5)

## 2018-04-05 SURGERY — COLONOSCOPY WITH PROPOFOL
Anesthesia: Monitor Anesthesia Care

## 2018-04-05 MED ORDER — LACTATED RINGERS IV SOLN
INTRAVENOUS | Status: DC
  Administered 2018-04-05: 1000 mL via INTRAVENOUS

## 2018-04-05 MED ORDER — MIDAZOLAM HCL 5 MG/5ML IJ SOLN
INTRAMUSCULAR | Status: DC | PRN
  Administered 2018-04-05: 2 mg via INTRAVENOUS

## 2018-04-05 MED ORDER — METOCLOPRAMIDE HCL 5 MG/ML IJ SOLN
10.0000 mg | Freq: Once | INTRAMUSCULAR | Status: AC
  Administered 2018-04-05: 10 mg via INTRAVENOUS
  Filled 2018-04-05: qty 2

## 2018-04-05 MED ORDER — ONDANSETRON HCL 4 MG/2ML IJ SOLN
INTRAMUSCULAR | Status: DC | PRN
  Administered 2018-04-05: 4 mg via INTRAVENOUS

## 2018-04-05 MED ORDER — PROPOFOL 500 MG/50ML IV EMUL
INTRAVENOUS | Status: DC | PRN
  Administered 2018-04-05: 100 ug/kg/min via INTRAVENOUS

## 2018-04-05 MED ORDER — PHENYLEPHRINE 40 MCG/ML (10ML) SYRINGE FOR IV PUSH (FOR BLOOD PRESSURE SUPPORT)
PREFILLED_SYRINGE | INTRAVENOUS | Status: DC | PRN
  Administered 2018-04-05: 80 ug via INTRAVENOUS

## 2018-04-05 SURGICAL SUPPLY — 25 items

## 2018-04-05 NOTE — Anesthesia Procedure Notes (Signed)
Procedure Name: MAC Date/Time: 04/05/2018 12:14 PM Performed by: Kyung Rudd, CRNA Pre-anesthesia Checklist: Patient identified, Emergency Drugs available, Suction available, Patient being monitored and Timeout performed Patient Re-evaluated:Patient Re-evaluated prior to induction Oxygen Delivery Method: Nasal cannula Induction Type: IV induction Placement Confirmation: positive ETCO2 Dental Injury: Teeth and Oropharynx as per pre-operative assessment

## 2018-04-05 NOTE — Anesthesia Preprocedure Evaluation (Addendum)
Anesthesia Evaluation  Patient identified by MRN, date of birth, ID band Patient awake    Reviewed: Allergy & Precautions, H&P , NPO status , Patient's Chart, lab work & pertinent test results  Airway Mallampati: II  TM Distance: >3 FB Neck ROM: Full    Dental no notable dental hx. (+) Dental Advisory Given, Teeth Intact   Pulmonary neg pulmonary ROS,    Pulmonary exam normal breath sounds clear to auscultation       Cardiovascular hypertension, Pt. on medications  Rhythm:Regular Rate:Normal     Neuro/Psych negative neurological ROS  negative psych ROS   GI/Hepatic negative GI ROS, Neg liver ROS,   Endo/Other  negative endocrine ROS  Renal/GU negative Renal ROS  negative genitourinary   Musculoskeletal   Abdominal   Peds  Hematology negative hematology ROS (+) anemia ,   Anesthesia Other Findings   Reproductive/Obstetrics negative OB ROS                            Anesthesia Physical Anesthesia Plan  ASA: II  Anesthesia Plan: MAC   Post-op Pain Management:    Induction: Intravenous  PONV Risk Score and Plan: 2 and Propofol infusion and Ondansetron  Airway Management Planned: Nasal Cannula  Additional Equipment:   Intra-op Plan:   Post-operative Plan:   Informed Consent: I have reviewed the patients History and Physical, chart, labs and discussed the procedure including the risks, benefits and alternatives for the proposed anesthesia with the patient or authorized representative who has indicated his/her understanding and acceptance.   Dental advisory given  Plan Discussed with: CRNA  Anesthesia Plan Comments:         Anesthesia Quick Evaluation

## 2018-04-05 NOTE — Progress Notes (Signed)
          Daily Rounding Note  04/05/2018, 8:18 AM  LOS: 3 days   SUBJECTIVE:   Chief complaint: nausea with 1st part of prep last night.  Has ~ 300 to 400 ml left of second phase of prep.  Stool watery, brown colored, not clear.  Never saw blood with prep.  Last bleeding was in ED on 8/31 No dizziness or abd pain.        OBJECTIVE:         Vital signs in last 24 hours:    Temp:  [98 F (36.7 C)] 98 F (36.7 C) (09/02 2025) Pulse Rate:  [74-88] 88 (09/02 2058) Resp:  [19-25] 21 (09/03 0500) BP: (117-119)/(72-77) 119/72 (09/03 0500) SpO2:  [41 %-98 %] 91 % (09/02 2058) Last BM Date: 04/05/18 Filed Weights   04/03/18 0020 04/03/18 0648 04/04/18 0433  Weight: 88.5 kg 90.4 kg 88.7 kg   General: pleasant, alert, looks well   Heart: RRR Chest: clear bil.   Abdomen: NT, ND, active BS  Extremities: no CCE Neuro/Psych:  Oriented x 3.  No weakness or gross deficits.    Intake/Output from previous day: 09/02 0701 - 09/03 0700 In: 1063 [P.O.:160; I.V.:903] Out: 1200 [Urine:1200]  Intake/Output this shift: No intake/output data recorded.  Lab Results: Recent Labs    04/02/18 2021  04/03/18 0659 04/04/18 0406 04/05/18 0522  WBC 7.4  --   --  6.1 6.9  HGB 9.5*   < > 9.2* 8.5* 8.8*  HCT 32.2*   < > 29.1* 28.0* 28.7*  PLT 257  --   --  191 221   < > = values in this interval not displayed.   BMET Recent Labs    04/02/18 1745 04/03/18 0029 04/04/18 0406  NA 137 140 144  K 3.7 3.9 3.6  CL 105 111 111  CO2 24 24 26   GLUCOSE 136* 124* 95  BUN 11 13 <5*  CREATININE 0.79 0.68 0.62  CALCIUM 8.6* 8.1* 8.4*   LFT Recent Labs    04/02/18 1745  PROT 7.4  ALBUMIN 3.4*  AST 20  ALT 15  ALKPHOS 59  BILITOT 0.7   PT/INR Recent Labs    04/02/18 2021  LABPROT 14.7  INR 1.16     ASSESMENT:   *  Painless hematochezia.  Resolved. Some moderate NSAID, ASA use PTA.   8/31 CT angio: No active bleeding.  Prominent  rectal vessels, likely hemorrhoids.  Colon diverticulosis.  Ovarian dermoid.  1 cm right liver lesion, likely cyst.  Colonoscopy in 2009 (Dr Virginia Rochester): diverticulosis, internal hemorrhoids, rectal polyp (HP) EGD 2009: Loose wrap of GE Jx s/o laxity in LES, small HH.    *   Normocytic anemia.  Element of anemia of chronic dz.  Received 2 U PRBC so far.      PLAN   *  Complete golytely bowel prep this AM.  Added a dose of IV Reglan to address nausea.  EGD and colonoscopy at noon today.      Jennye Moccasin  04/05/2018, 8:18 AM Phone [phone]

## 2018-04-05 NOTE — Interval H&P Note (Signed)
History and Physical Interval Note:  04/05/2018 12:06 PM  [name]  has presented today for surgery, with the diagnosis of Hematochezia and anemia  The various methods of treatment have been discussed with the patient and family. After consideration of risks, benefits and other options for treatment, the patient has consented to  Procedure(s): COLONOSCOPY WITH PROPOFOL (N/A) ESOPHAGOGASTRODUODENOSCOPY (EGD) WITH PROPOFOL (N/A) as a surgical intervention .  The patient's history has been reviewed, patient examined, no change in status, stable for surgery.  I have reviewed the patient's chart and labs.  Questions were answered to the patient's satisfaction.     Amy Beard

## 2018-04-05 NOTE — Discharge Summary (Signed)
Physician Discharge Summary  Amy Beard ZDG:[id number] DOB: 03/11/1956 DOA: 04/02/2018  PCP: Pearson Grippe, MD  Admit date: 04/02/2018 Discharge date: 04/05/2018  Admitted From: Home  Disposition:  Home   Recommendations for Outpatient Follow-up and new medication changes:  1. Follow up with Dr. Selena Batten in 7 days 2. Patient was advised to stop aspirin.  Home Health: no  Equipment/Devices: no   Discharge Condition: stable  CODE STATUS: full  Diet recommendation: regular diet   Brief/Interim Summary: 62 year old lady who presented with hematochezia, generalized weakness and lightheadedness. She does have thesignificant past medical history for GERD. Shereported episode of bright red blood per rectum while moving her bowels, for about 5 times, associated with progressive generalized weakness and lightheadedness. On her initial physical examination blood pressure was 78/52,she waspale withdry mucous membranes, lungs clear to auscultation bilaterally, heart S1-2 present rhythmic,abdomensoft nontender, no lower extremity edema. Sodium 137,potassium 3.7, chloride 105, bicarb24,glucose 136, BUN 11, creatinine 0.79,white count 7.7, hemoglobin 10.4,hct 35.5, platelets 311. CTof the abdomen with colonic diverticulosis.  Patient was admitted to the hospital with working diagnosis of symptomatic anemia due to acute blood loss due to lower GI bleed.  1. Acute symptomatic anemia due to lower GI bleed, diverticular/ sp 2 units PRBC transfusion. Patient was admitted to the step down unit, continue telemetry monitoring. She received 2 units of PRBC transfusion. Her hb at discharge is 8.8 and Hct at 28,7. Further workup with upper endoscopy and colonoscopy showed normal stomach and diverticulosis through the entire colon. Patient was deemed stable to discharge on 09/03.   2. Asthma.  Remained stable with no signs of exacerbation.  3. Constipation.  Improved with laxatives.  4. Vitamin D  deficiency.  Continue vitamin D supplementation at home.   Discharge Diagnoses:  Principal Problem:   Rectal bleeding Active Problems:   Acute blood loss anemia   Hypotension due to blood loss   Rectal bleed   Hematochezia    Discharge Instructions   Allergies as of 04/05/2018   No Known Allergies     Medication List    STOP taking these medications   aspirin EC 81 MG tablet   naproxen sodium 220 MG tablet Commonly known as:  ALEVE     TAKE these medications   acetaminophen 500 MG tablet Commonly known as:  TYLENOL Take 1,500 mg by mouth at bedtime.   albuterol 108 (90 Base) MCG/ACT inhaler Commonly known as:  PROVENTIL HFA;VENTOLIN HFA Inhale 1-2 puffs into the lungs every 6 (six) hours as needed for wheezing or shortness of breath.   esomeprazole 20 MG capsule Commonly known as:  NEXIUM Take 20 mg by mouth daily.   OVER THE COUNTER MEDICATION Place 1 drop into both eyes daily as needed (dry eyes). Over the counter lubricating eye drop   polyethylene glycol packet Commonly known as:  MIRALAX / GLYCOLAX Take 17 g by mouth daily as needed.   Vitamin D (Ergocalciferol) 50000 units Caps capsule Commonly known as:  DRISDOL Take 50,000 Units by mouth every Sunday.       No Known Allergies  Consultations:  GI    Procedures/Studies: Ct Angio Abd/pel W And/or Wo Contrast  Result Date: 04/03/2018 CLINICAL DATA:  62 year old female with liquid loose stools. Rectal bleeding. EXAM: CTA ABDOMEN AND PELVIS WITH CONTRAST TECHNIQUE: Multidetector CT imaging of the abdomen and pelvis was performed using the standard protocol during bolus administration of intravenous contrast. Multiplanar reconstructed images and MIPs were obtained and reviewed to evaluate the vascular  anatomy. CONTRAST:  ISOVUE-370 IOPAMIDOL (ISOVUE-370) INJECTION 76% COMPARISON:  None. FINDINGS: VASCULAR Aorta: Normal caliber aorta without aneurysm, dissection, vasculitis or significant  stenosis. Celiac: Patent without evidence of aneurysm, dissection, vasculitis or significant stenosis. SMA: Patent without evidence of aneurysm, dissection, vasculitis or significant stenosis. Renals: Both renal arteries are patent without evidence of aneurysm, dissection, vasculitis, fibromuscular dysplasia or significant stenosis. IMA: Patent without evidence of aneurysm, dissection, vasculitis or significant stenosis. Inflow: Patent without evidence of aneurysm, dissection, vasculitis or significant stenosis. Proximal Outflow: Bilateral common femoral and visualized portions of the superficial and profunda femoral arteries are patent without evidence of aneurysm, dissection, vasculitis or significant stenosis. Veins: No obvious venous abnormality within the limitations of this arterial phase study. Review of the MIP images confirms the above findings. NON-VASCULAR Lower chest: Mild bibasal subpleural thickening. The visualized lung bases are otherwise clear. There is a moderate-sized pericardial effusion measuring up to 2 cm in thickness. Correlation with echocardiogram recommended. There is no intra-abdominal free air or free fluid. Hepatobiliary: A 1 cm hypodense lesion in the right lobe of the liver is not well characterized but demonstrates fluid attenuation most likely representing a cyst. The liver is otherwise unremarkable. No intrahepatic biliary ductal dilatation. The gallbladder is unremarkable as well. Pancreas: Unremarkable. No pancreatic ductal dilatation or surrounding inflammatory changes. Spleen: Subcentimeter splenic hypodense lesion is not well characterized but may represent a cyst or hemangioma. Adrenals/Urinary Tract: The adrenal glands are unremarkable. Small bilateral renal cysts and subcentimeter hypodensities which are too small to characterize. The kidneys are otherwise unremarkable. There is symmetric enhancement of the kidneys. The visualized ureters and urinary bladder appear  unremarkable. Stomach/Bowel: The stomach is moderately distended with ingested content. There are scattered colonic diverticula without active inflammatory changes. There is no bowel obstruction or active inflammation. Loose stool noted throughout the colon compatible with diarrheal state. There is apparent mucosal enhancement of the rectum most likely from prominent hemorrhoidal vessels or bleed. The appendix is normal. Lymphatic: No significant vascular findings are present. No enlarged abdominal or pelvic lymph nodes. Reproductive: The uterus is retroflexed. There is a 2.7 cm right uterine calcified fibroid. A 15 mm fatty lesion in the region of the right adnexa most consistent with dermoid. The left ovary is unremarkable. Other: None Musculoskeletal: There is degenerative changes of the spine. Grade 1 L4-L5 and L5-S1 retrolisthesis. No acute osseous pathology. IMPRESSION: VASCULAR No acute findings.  No significant vascular pathology. NON-VASCULAR 1. Probable prominent hemorrhoidal vessels with associated minimal bleed at the rectum. Diarrheal state. 2. Colonic diverticulosis. No bowel obstruction or active inflammation. Normal appendix. 3. A 1.5 cm right ovarian dermoid. Electronically Signed   By: Elgie Collard M.D.   On: 04/03/2018 00:00       Subjective: Patient is feeling better, no nausea or vomiting, no abdominal pain, no hematochezia or melena.   Discharge Exam: Vitals:   04/05/18 1300 04/05/18 1305  BP: (!) 116/100 126/73  Pulse: 74 76  Resp: 15 20  Temp:    SpO2: 100% 100%   Vitals:   04/05/18 1250 04/05/18 1252 04/05/18 1300 04/05/18 1305  BP:   (!) 116/100 126/73  Pulse: 76  74 76  Resp: (!) 22 (!) 24 15 20   Temp:      TempSrc:      SpO2: 100%  100% 100%  Weight:      Height:        General: Not in pain or dyspnea Neurology: Awake and alert, non focal  E ENT: no pallor, no icterus, oral mucosa moist Cardiovascular: No JVD. S1-S2 present, rhythmic, no gallops, rubs,  or murmurs. No lower extremity edema. Pulmonary: vesicular breath sounds bilaterally, adequate air movement, no wheezing, rhonchi or rales. Gastrointestinal. Abdomen with no organomegaly, non tender, no rebound or guarding Skin. No rashes Musculoskeletal: no joint deformities   The results of significant diagnostics from this hospitalization (including imaging, microbiology, ancillary and laboratory) are listed below for reference.     Microbiology: Recent Results (from the past 240 hour(s))  MRSA PCR Screening     Status: None   Collection Time: 04/03/18 12:12 AM  Result Value Ref Range Status   MRSA by PCR NEGATIVE NEGATIVE Final    Comment:        The GeneXpert MRSA Assay (FDA approved for NASAL specimens only), is one component of a comprehensive MRSA colonization surveillance program. It is not intended to diagnose MRSA infection nor to guide or monitor treatment for MRSA infections. Performed at [facility], [address], [place], [place] [address]      Labs: BNP (last 3 results) No results for input(s): BNP in the last 8760 hours. Basic Metabolic Panel: Recent Labs  Lab 04/02/18 1745 04/03/18 0029 04/04/18 0406  NA 137 140 144  K 3.7 3.9 3.6  CL 105 111 111  CO2 24 24 26   GLUCOSE 136* 124* 95  BUN 11 13 <5*  CREATININE 0.79 0.68 0.62  CALCIUM 8.6* 8.1* 8.4*   Liver Function Tests: Recent Labs  Lab 04/02/18 1745  AST 20  ALT 15  ALKPHOS 59  BILITOT 0.7  PROT 7.4  ALBUMIN 3.4*   No results for input(s): LIPASE, AMYLASE in the last 168 hours. No results for input(s): AMMONIA in the last 168 hours. CBC: Recent Labs  Lab 04/02/18 1745 04/02/18 2021 04/03/18 0029 04/03/18 0659 04/04/18 0406 04/05/18 0522  WBC 7.7 7.4  --   --  6.1 6.9  NEUTROABS  --   --   --   --  3.4 4.6  HGB 10.4* 9.5* 10.2* 9.2* 8.5* 8.8*  HCT 35.5* 32.2* 33.0* 29.1* 28.0* 28.7*  MCV 80.9 81.3  --   --  82.4 82.2  PLT 311 257  --   --  191 221   Cardiac  Enzymes: No results for input(s): CKTOTAL, CKMB, CKMBINDEX, TROPONINI in the last 168 hours. BNP: Invalid input(s): POCBNP CBG: No results for input(s): GLUCAP in the last 168 hours. D-Dimer No results for input(s): DDIMER in the last 72 hours. Hgb A1c No results for input(s): HGBA1C in the last 72 hours. Lipid Profile No results for input(s): CHOL, HDL, LDLCALC, TRIG, CHOLHDL, LDLDIRECT in the last 72 hours. Thyroid function studies No results for input(s): TSH, T4TOTAL, T3FREE, THYROIDAB in the last 72 hours.  Invalid input(s): FREET3 Anemia work up No results for input(s): VITAMINB12, FOLATE, FERRITIN, TIBC, IRON, RETICCTPCT in the last 72 hours. Urinalysis    Component Value Date/Time   COLORURINE YELLOW 05/09/2011 2158   APPEARANCEUR CLOUDY (A) 05/09/2011 2158   LABSPEC 1.008 05/09/2011 2158   PHURINE 8.0 05/09/2011 2158   GLUCOSEU NEGATIVE 05/09/2011 2158   HGBUR TRACE (A) 05/09/2011 2158   BILIRUBINUR NEGATIVE 05/09/2011 2158   KETONESUR NEGATIVE 05/09/2011 2158   PROTEINUR NEGATIVE 05/09/2011 2158   UROBILINOGEN 0.2 05/09/2011 2158   NITRITE NEGATIVE 05/09/2011 2158   LEUKOCYTESUR NEGATIVE 05/09/2011 2158   Sepsis Labs Invalid input(s): PROCALCITONIN,  WBC,  LACTICIDVEN Microbiology Recent Results (from the past  240 hour(s))  MRSA PCR Screening     Status: None   Collection Time: 04/03/18 12:12 AM  Result Value Ref Range Status   MRSA by PCR NEGATIVE NEGATIVE Final    Comment:        The GeneXpert MRSA Assay (FDA approved for NASAL specimens only), is one component of a comprehensive MRSA colonization surveillance program. It is not intended to diagnose MRSA infection nor to guide or monitor treatment for MRSA infections. Performed at [facility], [address] [place], [place] [address]      Time coordinating discharge: 45 minutes  SIGNED:   Coralie Keens, MD  [facility] 04/05/2018, 1:08 PM Pager [phone]  If  7PM-7AM, please contact night-coverage [email or web address] Password [email or web address]

## 2018-04-05 NOTE — Transfer of Care (Signed)
Immediate Anesthesia Transfer of Care Note  Patient: Amy Beard  Procedure(s) Performed: COLONOSCOPY WITH PROPOFOL (N/A ) ESOPHAGOGASTRODUODENOSCOPY (EGD) WITH PROPOFOL (N/A )  Patient Location: Endoscopy Unit  Anesthesia Type:MAC  Level of Consciousness: drowsy  Airway & Oxygen Therapy: Patient Spontanous Breathing and Patient connected to nasal cannula oxygen  Post-op Assessment: Report given to RN, Post -op Vital signs reviewed and stable and Patient moving all extremities X 4  Post vital signs: Reviewed and stable  Last Vitals:  Vitals Value Taken Time  BP 100/57 04/05/2018 12:47 PM  Temp 36.4 C 04/05/2018 12:47 PM  Pulse 78 04/05/2018 12:47 PM  Resp 27 04/05/2018 12:47 PM  SpO2 100 % 04/05/2018 12:50 PM    Last Pain:  Vitals:   04/05/18 1247  TempSrc: Oral  PainSc:          Complications: No apparent anesthesia complications

## 2018-04-05 NOTE — Anesthesia Postprocedure Evaluation (Signed)
Anesthesia Post Note  Patient: Lou Miner  Procedure(s) Performed: COLONOSCOPY WITH PROPOFOL (N/A ) ESOPHAGOGASTRODUODENOSCOPY (EGD) WITH PROPOFOL (N/A )     Patient location during evaluation: PACU Anesthesia Type: MAC Level of consciousness: awake and alert Pain management: pain level controlled Vital Signs Assessment: post-procedure vital signs reviewed and stable Respiratory status: spontaneous breathing, nonlabored ventilation and respiratory function stable Cardiovascular status: stable and blood pressure returned to baseline Postop Assessment: no apparent nausea or vomiting Anesthetic complications: no    Last Vitals:  Vitals:   04/05/18 1300 04/05/18 1305  BP: (!) 116/100 126/73  Pulse: 74 76  Resp: 15 20  Temp:    SpO2: 100% 100%    Last Pain:  Vitals:   04/05/18 1300  TempSrc:   PainSc: 0-No pain                 Abiel Antrim,W. EDMOND

## 2018-04-05 NOTE — Progress Notes (Signed)
Patient couldn't finished her prep all the jar.  Per patient, she feel very sick notified the MD hung, patrick.

## 2018-04-05 NOTE — Op Note (Signed)
[facility] Patient Name: Amy Beard Procedure Date : 04/05/2018 MRN: [id number] Attending MD: Starr Lake. Myrtie Neither , MD Date of Birth: 02/01/1956 CSN: [id number] Age: 62 Admit Type: Inpatient Procedure:                Colonoscopy Indications:              Hematochezia, Acute post hemorrhagic anemia Providers:                Sherilyn Cooter L. Myrtie Neither, MD, Norman Clay, RN, Harrington Challenger,                            Technician Referring MD:              Medicines:                Monitored Anesthesia Care Complications:            No immediate complications. Estimated Blood Loss:     Estimated blood loss: none. Procedure:                Pre-Anesthesia Assessment:                           - Prior to the procedure, a History and Physical                            was performed, and patient medications and                            allergies were reviewed. The patient's tolerance of                            previous anesthesia was also reviewed. The risks                            and benefits of the procedure and the sedation                            options and risks were discussed with the patient.                            All questions were answered, and informed consent                            was obtained. Prior Anticoagulants: The patient has                            taken no previous anticoagulant or antiplatelet                            agents. ASA Grade Assessment: II - A patient with                            mild systemic disease. After reviewing the risks  and benefits, the patient was deemed in                            satisfactory condition to undergo the procedure.                           - Prior to the procedure, a History and Physical                            was performed, and patient medications and                            allergies were reviewed. The patient's tolerance of                            previous anesthesia was  also reviewed. The risks                            and benefits of the procedure and the sedation                            options and risks were discussed with the patient.                            All questions were answered, and informed consent                            was obtained. Prior Anticoagulants: The patient has                            taken no previous anticoagulant or antiplatelet                            agents. ASA Grade Assessment: II - A patient with                            mild systemic disease. After reviewing the risks                            and benefits, the patient was deemed in                            satisfactory condition to undergo the procedure.                           After obtaining informed consent, the colonoscope                            was passed under direct vision. Throughout the                            procedure, the patient's blood pressure, pulse, and  oxygen saturations were monitored continuously. The                            [id number]-HQ190L ([id number] Olympus adult colon was                            introduced through the anus and advanced to the the                            cecum, identified by appendiceal orifice and                            ileocecal valve. The colonoscopy was performed                            without difficulty. The patient tolerated the                            procedure well. The quality of the bowel                            preparation was excellent. The appendiceal orifice,                            and rectum were photographed. The bowel preparation                            used was MoviPrep. Scope In: 12:25:49 PM Scope Out: 12:38:10 PM Scope Withdrawal Time: 0 hours 6 minutes 8 seconds  Total Procedure Duration: 0 hours 12 minutes 21 seconds  Findings:      The perianal and digital rectal examinations were normal.      Many diverticula were found in the  entire colon (L>>R).      The exam was otherwise without abnormality on direct and retroflexion       views. Impression:               - Diverticulosis in the entire examined colon.                           - The examination was otherwise normal on direct                            and retroflexion views.                           - No specimens collected.                           Diverticular bleed - resolved. Recommendation:           - Patient has a contact number available for                            emergencies. The signs and symptoms of potential  delayed complications were discussed with the                            patient. Return to normal activities tomorrow.                            Written discharge instructions were provided to the                            patient.                           - Resume regular diet.                           - Return patient to hospital ward for possible                            discharge same day.                           - Repeat colonoscopy in 10 years for screening                            purposes. Procedure Code(s):        --- Professional ---                           [phone], Colonoscopy, flexible; diagnostic, including                            collection of specimen(s) by brushing or washing,                            when performed (separate procedure) Diagnosis Code(s):        --- Professional ---                           K92.1, Melena (includes Hematochezia)                           D62, Acute posthemorrhagic anemia                           K57.30, Diverticulosis of large intestine without                            perforation or abscess without bleeding CPT copyright 2017 [employer]. All rights reserved. The codes documented in this report are preliminary and upon coder review may  be revised to meet current compliance requirements. Brain Honeycutt L. Myrtie Neither, MD 04/05/2018  12:47:35 PM This report has been signed electronically. Number of Addenda: 0

## 2018-04-05 NOTE — Progress Notes (Signed)
Amy Beard to be D/C'd home per MD order. Discussed with the patient and all questions fully answered. VVS, Skin clean, dry and intact without evidence of skin break down, no evidence of skin tears noted.  IV catheter discontinued intact. Site without signs and symptoms of complications. Dressing and pressure applied.  An After Visit Summary was printed and given to the patient.  Patient escorted via [facility] by husband, and D/C home via private auto.  [name]  04/05/2018 3:04 PM

## 2018-04-05 NOTE — Op Note (Signed)
[facility] Patient Name: Amy Beard Procedure Date : 04/05/2018 MRN: [id number] Attending MD: Starr Lake. Myrtie Neither , MD Date of Birth: 21-Oct-1955 CSN: [id number] Age: 62 Admit Type: Inpatient Procedure:                Upper GI endoscopy Indications:              Acute post hemorrhagic anemia, Hematochezia (rule                            out upper GI source) Providers:                Starr Lake. Myrtie Neither, MD, Norman Clay, RN, Harrington Challenger,                            Technician Referring MD:              Medicines:                Monitored Anesthesia Care Complications:            No immediate complications. Estimated Blood Loss:     Estimated blood loss: none. Procedure:                Pre-Anesthesia Assessment:                           - Prior to the procedure, a History and Physical                            was performed, and patient medications and                            allergies were reviewed. The patient's tolerance of                            previous anesthesia was also reviewed. The risks                            and benefits of the procedure and the sedation                            options and risks were discussed with the patient.                            All questions were answered, and informed consent                            was obtained. Prior Anticoagulants: The patient has                            taken no previous anticoagulant or antiplatelet                            agents. ASA Grade Assessment: II - A patient with  mild systemic disease. After reviewing the risks                            and benefits, the patient was deemed in                            satisfactory condition to undergo the procedure.                           After obtaining informed consent, the endoscope was                            passed under direct vision. Throughout the                            procedure, the patient's blood  pressure, pulse, and                            oxygen saturations were monitored continuously. The                            GIF-H190 ([id number]) Olympus Adult EGD was introduced                            through the mouth, and advanced to the second part                            of duodenum. The upper GI endoscopy was                            accomplished without difficulty. The patient                            tolerated the procedure well. Scope In: Scope Out: Findings:      A small hiatal hernia was present.      The exam of the esophagus was otherwise normal.      The stomach was normal.      The cardia and gastric fundus were normal on retroflexion.      The examined duodenum was normal. Impression:               - Small hiatal hernia.                           - Normal stomach.                           - Normal examined duodenum.                           - No specimens collected. Recommendation:           - Patient has a contact number available for                            emergencies. The signs and symptoms of potential  delayed complications were discussed with the                            patient. Return to normal activities tomorrow.                            Written discharge instructions were provided to the                            patient.                           - Return patient to hospital ward for possible                            discharge same day.                           - Resume regular diet.                           - See the other procedure note for documentation of                            additional recommendations. Procedure Code(s):        --- Professional ---                           [phone], Esophagogastroduodenoscopy, flexible,                            transoral; diagnostic, including collection of                            specimen(s) by brushing or washing, when performed                             (separate procedure) Diagnosis Code(s):        --- Professional ---                           K44.9, Diaphragmatic hernia without obstruction or                            gangrene                           D62, Acute posthemorrhagic anemia                           K92.1, Melena (includes Hematochezia) CPT copyright 2017 American Medical Association. All rights reserved. The codes documented in this report are preliminary and upon coder review may  be revised to meet current compliance requirements. Amy Brigance L. Myrtie Neither, MD 04/05/2018 12:44:38 PM This report has been signed electronically. Number of Addenda: 0

## 2018-04-06 LAB — BPAM RBC
BLOOD PRODUCT EXPIRATION DATE: 201909272359
Blood Product Expiration Date: 201909272359
Blood Product Expiration Date: 201909272359
Blood Product Expiration Date: 201909272359
ISSUE DATE / TIME: 201908311954
ISSUE DATE / TIME: 201908312200
UNIT TYPE AND RH: 5100
Unit Type and Rh: 5100
Unit Type and Rh: 5100
Unit Type and Rh: 5100

## 2018-04-06 LAB — TYPE AND SCREEN
ABO/RH(D): O POS
ANTIBODY SCREEN: NEGATIVE
UNIT DIVISION: 0
Unit division: 0
Unit division: 0
Unit division: 0

## 2018-08-20 ENCOUNTER — Emergency Department (HOSPITAL_COMMUNITY): Payer: BLUE CROSS/BLUE SHIELD

## 2018-08-20 ENCOUNTER — Encounter (HOSPITAL_COMMUNITY): Payer: Self-pay

## 2018-08-20 ENCOUNTER — Emergency Department (HOSPITAL_COMMUNITY)
Admission: EM | Admit: 2018-08-20 | Discharge: 2018-08-20 | Disposition: A | Discharge status: Home / Self Care | Payer: BLUE CROSS/BLUE SHIELD | Attending: Emergency Medicine | Admitting: Emergency Medicine

## 2018-08-20 DIAGNOSIS — I10 Essential (primary) hypertension: Secondary | ICD-10-CM | POA: Diagnosis not present

## 2018-08-20 DIAGNOSIS — I480 Paroxysmal atrial fibrillation: Secondary | ICD-10-CM | POA: Insufficient documentation

## 2018-08-20 DIAGNOSIS — R531 Weakness: Secondary | ICD-10-CM | POA: Diagnosis present

## 2018-08-20 DIAGNOSIS — Z79899 Other long term (current) drug therapy: Secondary | ICD-10-CM | POA: Insufficient documentation

## 2018-08-20 LAB — CBC WITH DIFFERENTIAL/PLATELET
Abs Immature Granulocytes: 0.01 10*3/uL (ref 0.00–0.07)
Basophils Absolute: 0 10*3/uL (ref 0.0–0.1)
Basophils Relative: 0 %
EOS ABS: 0.1 10*3/uL (ref 0.0–0.5)
Eosinophils Relative: 2 %
HCT: 39 % (ref 36.0–46.0)
Hemoglobin: 11.2 g/dL — ABNORMAL LOW (ref 12.0–15.0)
Immature Granulocytes: 0 %
Lymphocytes Relative: 25 %
Lymphs Abs: 1.3 10*3/uL (ref 0.7–4.0)
MCH: 21.7 pg — ABNORMAL LOW (ref 26.0–34.0)
MCHC: 28.7 g/dL — ABNORMAL LOW (ref 30.0–36.0)
MCV: 75.6 fL — ABNORMAL LOW (ref 80.0–100.0)
Monocytes Absolute: 0.7 10*3/uL (ref 0.1–1.0)
Monocytes Relative: 14 %
Neutro Abs: 2.9 10*3/uL (ref 1.7–7.7)
Neutrophils Relative %: 59 %
Platelets: 309 10*3/uL (ref 150–400)
RBC: 5.16 MIL/uL — ABNORMAL HIGH (ref 3.87–5.11)
RDW: 19.3 % — ABNORMAL HIGH (ref 11.5–15.5)
WBC: 5 10*3/uL (ref 4.0–10.5)
nRBC: 0 % (ref 0.0–0.2)

## 2018-08-20 LAB — COMPREHENSIVE METABOLIC PANEL
ALT: 13 U/L (ref 0–44)
AST: 22 U/L (ref 15–41)
Albumin: 3.6 g/dL (ref 3.5–5.0)
Alkaline Phosphatase: 65 U/L (ref 38–126)
Anion gap: 11 (ref 5–15)
BILIRUBIN TOTAL: 0.3 mg/dL (ref 0.3–1.2)
BUN: 9 mg/dL (ref 8–23)
CO2: 23 mmol/L (ref 22–32)
Calcium: 9.2 mg/dL (ref 8.9–10.3)
Chloride: 106 mmol/L (ref 98–111)
Creatinine, Ser: 0.73 mg/dL (ref 0.44–1.00)
GFR calc Af Amer: >60 mL/min (ref >60)
GFR calc non Af Amer: >60 mL/min (ref >60)
Glucose, Bld: 99 mg/dL (ref 70–99)
Potassium: 3.5 mmol/L (ref 3.5–5.1)
Sodium: 140 mmol/L (ref 135–145)
Total Protein: 8.1 g/dL (ref 6.5–8.1)

## 2018-08-20 LAB — MAGNESIUM: Magnesium: 2 mg/dL (ref 1.7–2.4)

## 2018-08-20 LAB — TSH: TSH: 1.635 u[IU]/mL (ref 0.350–4.500)

## 2018-08-20 LAB — I-STAT TROPONIN, ED: Troponin i, poc: 0 ng/mL (ref 0.00–0.08)

## 2018-08-20 MED ORDER — METOPROLOL TARTRATE 25 MG PO TABS: 25.0000 mg | ORAL_TABLET | Freq: Two times a day (BID) | ORAL | 0 refills | Status: DC

## 2018-08-20 MED ORDER — SODIUM CHLORIDE 0.9 % IV BOLUS (SEPSIS)
1000.0000 mL | Freq: Once | INTRAVENOUS | Status: AC
  Administered 2018-08-20: 1000 mL via INTRAVENOUS

## 2018-08-20 MED ORDER — METOPROLOL TARTRATE 25 MG PO TABS
25.0000 mg | ORAL_TABLET | Freq: Once | ORAL | Status: AC
  Administered 2018-08-20: 25 mg via ORAL
  Filled 2018-08-20: qty 1

## 2018-08-20 NOTE — ED Provider Notes (Signed)
TIME SEEN: 2:26 AM  CHIEF COMPLAINT: Generalized weakness, fatigue  HPI: Patient is a 63 year old female with history of hypertension, previous GI bleed who presents to the emergency department with complaints of 1 month of generalized weakness and fatigue that has progressively worsened.  States her PCP is Dr. Fransisco BeauKam and he has been out of the office recently.  States she saw new primary doctor today but cannot remember their name.  States that that they told her that her EKG was abnormal but she cannot remember what else they told her.  She does report cough and congestion recently and she was started on a Z-Pak.  States tonight she got up to go the bathroom and felt like she was going to pass out.  No chest pain or chest discomfort.  No shortness of breath.  No vomiting.  Has had some diarrhea but is not sure if she has had any bloody stool or melena.  No numbness, tingling or focal weakness.  No previous history of atrial fibrillation.  She is not on antiplatelets or anticoagulants.  Her chads vas 2 score is 2.  She denies any alcohol use, illicit drug use, amulet use, caffeine intake.    ROS: See HPI Constitutional: no fever  Eyes: no drainage  ENT: no runny nose   Cardiovascular:  no chest pain  Resp: no SOB  GI: no vomiting GU: no dysuria Integumentary: no rash  Allergy: no hives  Musculoskeletal: no leg swelling  Neurological: no slurred speech ROS otherwise negative  PAST MEDICAL HISTORY/PAST SURGICAL HISTORY:  Past Medical History:  Diagnosis Date  . GI bleeding 04/2018  . Hypertension     MEDICATIONS:  Prior to Admission medications   Medication Sig Start Date End Date Taking? Authorizing Provider  acetaminophen (TYLENOL) 500 MG tablet Take 1,500 mg by mouth at bedtime.     [provider]  albuterol (PROVENTIL HFA;VENTOLIN HFA) 108 (90 Base) MCG/ACT inhaler Inhale 1-2 puffs into the lungs every 6 (six) hours as needed for wheezing or shortness of breath.    [provider]  esomeprazole (NEXIUM) 20 MG capsule Take 20 mg by mouth daily.     [provider]  OVER THE COUNTER MEDICATION Place 1 drop into both eyes daily as needed (dry eyes). Over the counter lubricating eye drop    [provider]  polyethylene glycol (MIRALAX / GLYCOLAX) packet Take 17 g by mouth daily as needed.    [provider]  Vitamin D, Ergocalciferol, (DRISDOL) 50000 UNITS CAPS capsule Take 50,000 Units by mouth every Sunday.     [provider]    ALLERGIES:  No Known Allergies  SOCIAL HISTORY:  Social History   Tobacco Use  . Smoking status: Never Smoker  . Smokeless tobacco: Never Used  Substance Use Topics  . Alcohol use: No    FAMILY HISTORY: Family History  Problem Relation Age of Onset  . Diabetes Mellitus II Mother   . Diabetes Mellitus II Father     EXAM: BP 132/87   Pulse 74   Temp 98.1 F (36.7 C) (Oral)   Resp 14   SpO2 100%  CONSTITUTIONAL: Alert and oriented and responds appropriately to questions. Well-appearing; well-nourished HEAD: Normocephalic EYES: Conjunctivae clear, pupils appear equal, EOMI ENT: normal nose; moist mucous membranes NECK: Supple, no meningismus, no nuchal rigidity, no LAD  CARD: Irregularly irregular and intermittently tachycardic; S1 and S2 appreciated; no murmurs, no clicks, no rubs, no gallops RESP: Normal chest excursion without  splinting or tachypnea; breath sounds clear and equal bilaterally; no wheezes, no rhonchi, no rales, no hypoxia or respiratory distress, speaking full sentences ABD/GI: Normal bowel sounds; non-distended; soft, non-tender, no rebound, no guarding, no peritoneal signs, no hepatosplenomegaly BACK:  The back appears normal and is non-tender to palpation, there is no CVA tenderness EXT: Normal ROM in all joints; non-tender to palpation; no edema; normal capillary refill; no cyanosis, no calf tenderness or swelling    SKIN: Normal color for age and race;  warm; no rash NEURO: Moves all extremities equally, strength 5/5 in all 4 extremities patient has poor effort, sensation to light touch intact diffusely, cranial nerves II through XII intact, normal speech PSYCH: The patient's mood and manner are appropriate. Grooming and personal hygiene are appropriate.  MEDICAL DECISION MAKING: Patient here with generalized weakness and fatigue that has progressively worsened.  States that her primary care physician put her on a Z-Pak today for cough and congestion and told her that her EKG was abnormal.  It appears she is in atrial fibrillation which is a new diagnosis for her and she is intermittently tachycardic here.  This may be why she is feeling so fatigued and had a near syncopal event tonight.  Will check electrolytes, hemoglobin, cardiac labs, chest x-ray.  ED PROGRESS: Patient's labs are reassuring.  Hemoglobin is 11.2 which has improved from her GI bleed in August.  Electrolytes within normal limits.  TSH normal.  Troponin negative.  Chest x-ray clear.  Patient is now back in a normal sinus rhythm spontaneously.  Discussed her case with Dr. Allena EaringBazemore on call for cardiology.  He agrees that patient can be discharged home and patient would prefer this.  He recommends starting metoprolol 25 mg twice daily with close follow-up in the A. fib clinic.  We will hold on anticoagulation at this time given her recent GI bleed and patient agrees.  She understands there are some risk for stroke without being on anticoagulation.  Patient states she would prefer discharge over admission to the hospital at this time.   5:20 AM  Pt remains in normal sinus rhythm without complaints.  She is comfortable with plan for discharge home and states she would prefer this over admission.  Discussed at length return precautions and will discharge with referral to atrial fibrillation clinic.  Patient has been comfortable with this plan.  Will discharge on metoprolol.   At this time, I  do not feel there is any life-threatening condition present. I have reviewed and discussed all results (EKG, imaging, lab, urine as appropriate) and exam findings with patient/family. I have reviewed nursing notes and appropriate previous records.  I feel the patient is safe to be discharged home without further emergent workup and can continue workup as an outpatient as needed. Discussed usual and customary return precautions. Patient/family verbalize understanding and are comfortable with this plan.  Outpatient follow-up has been provided as needed. All questions have been answered.   EKG Interpretation  Date/Time:  Saturday August 20 2018 02:03:52 EST Ventricular Rate:  86 PR Interval:    QRS Duration: 83 QT Interval:  402 QTC Calculation: 461 R Axis:   51 Text Interpretation:  Atrial fibrillation Ventricular premature complex Minimal ST elevation, inferior leads Baseline wander in lead(s) V3 A fib is new compared to prior Confirmed by Rochele RaringWard, Cachet Mccutchen [phone] on 08/20/2018 2:26:35 AM         EKG Interpretation  Date/Time:  Saturday August 20 2018 03:53:55 EST Ventricular Rate:  73  PR Interval:    QRS Duration: 90 QT Interval:  394 QTC Calculation: 435 R Axis:   61 Text Interpretation:  Sinus rhythm A fib has resolved Confirmed by Rochele Raring [phone]) on 08/20/2018 4:01:12 AM         Adeel Guiffre, Layla Maw, DO 08/20/18 [id number]

## 2018-08-20 NOTE — ED Triage Notes (Signed)
Pt arrived via [facility]; pt from [facility] with c/o increased wkness x 3-4 wks and inability to walk without constant rest periods. Pt states that MD told her that something was "odd" with her heart. Pt c/o lightheadness and felt like she was going to pass out. EMS reports NS PVCs and bigeminy with increased rate and PVCs when standing; 120/79, 76, 18, 99% on RA, CBG 112

## 2018-08-22 ENCOUNTER — Ambulatory Visit (HOSPITAL_COMMUNITY)
Admission: RE | Admit: 2018-08-22 | Discharge: 2018-08-22 | Disposition: A | Discharge status: Home / Self Care | Payer: PRIVATE HEALTH INSURANCE | Source: Ambulatory Visit | Attending: Nurse Practitioner | Admitting: Nurse Practitioner

## 2018-08-22 VITALS — BP 124/58 | HR 74 | Ht 67.0 in | Wt 195.6 lb

## 2018-08-22 DIAGNOSIS — I480 Paroxysmal atrial fibrillation: Secondary | ICD-10-CM | POA: Diagnosis present

## 2018-08-22 DIAGNOSIS — Z6830 Body mass index (BMI) 30.0-30.9, adult: Secondary | ICD-10-CM | POA: Diagnosis not present

## 2018-08-22 DIAGNOSIS — R400 Somnolence: Secondary | ICD-10-CM | POA: Insufficient documentation

## 2018-08-22 DIAGNOSIS — Z79899 Other long term (current) drug therapy: Secondary | ICD-10-CM | POA: Insufficient documentation

## 2018-08-22 DIAGNOSIS — R0683 Snoring: Secondary | ICD-10-CM

## 2018-08-22 DIAGNOSIS — E669 Obesity, unspecified: Secondary | ICD-10-CM | POA: Diagnosis not present

## 2018-08-22 DIAGNOSIS — I10 Essential (primary) hypertension: Secondary | ICD-10-CM | POA: Insufficient documentation

## 2018-08-22 NOTE — Progress Notes (Signed)
Primary Care Physician: Pearson GrippeKim, James, MD Referring Physician: Referred from Amy Beard   [name] is a 63 y.o. female with a history of paroxysmal atrial fibrillation who [facility].  The patient was initially diagnosed with atrial fibrillation at the ER on 08/20/18. She does report cough and congestion recently and she was started on a Z-Pak.  Prior to presenting to the ER, she got up to go the bathroom and felt like she was going to pass out. She reports that she has been feeling much more fatigued and short of breath over the last month. These symptoms are persistent. She denies any palpitations or heart racing. Of note, she has a history of significant GI and varicose vein bleeding.  Today, she denies symptoms of palpitations, chest pain, orthopnea, PND, syncope, or neurologic sequela. The patient is tolerating medications without difficulties and is otherwise without complaint today.    Atrial Fibrillation Risk Factors:  she does have symptoms or diagnosis of sleep apnea.  she does not have a history of rheumatic fever.  she does not have a history of alcohol use.  she has a BMI of Body mass index is 30.64 kg/m.Amy Beard. Filed Weights   08/22/18 1033  Weight: 88.7 kg      Atrial Fibrillation Management history:  Previous antiarrhythmic drugs: none  Previous cardioversions: none  Previous ablations: none  CHADS2VASC score: 2 (female, HTN)  Anticoagulation history: none 2/2 GI bleeding   Past Medical History:  Diagnosis Date  . GI bleeding 04/2018  . Hypertension    Past Surgical History:  Procedure Laterality Date  . COLONOSCOPY WITH PROPOFOL N/A 04/05/2018   Procedure: COLONOSCOPY WITH PROPOFOL;  Surgeon: Sherrilyn Ristanis, Henry L III, MD;  [facility];  Service: Gastroenterology;  Laterality: N/A;  . ESOPHAGOGASTRODUODENOSCOPY (EGD) WITH PROPOFOL N/A 04/05/2018   Procedure: ESOPHAGOGASTRODUODENOSCOPY  (EGD) WITH PROPOFOL;  Surgeon: Sherrilyn Ristanis, Henry L III, MD;  [facility];  Service: Gastroenterology;  Laterality: N/A;  . VARICOSE VEIN SURGERY      Current Outpatient Medications  Medication Sig Dispense Refill  . acetaminophen (TYLENOL) 500 MG tablet Take 1,500 mg by mouth daily as needed for mild pain, moderate pain, fever or headache.     . albuterol (PROVENTIL HFA;VENTOLIN HFA) 108 (90 Base) MCG/ACT inhaler Inhale 1-2 puffs into the lungs every 6 (six) hours as needed for wheezing or shortness of breath.    Amy Beard. azithromycin (ZITHROMAX) 250 MG tablet Take 250-500 mg by mouth daily. Take 500 mg on the first day and 250 mg for the next four days    . esomeprazole (NEXIUM) 20 MG capsule Take 20 mg by mouth daily.     . metoprolol tartrate (LOPRESSOR) 25 MG tablet Take 1 tablet (25 mg total) by mouth 2 (two) times daily. 60 tablet 0  . OVER THE COUNTER MEDICATION Place 1 drop into both eyes daily as needed (dry eyes). Over the counter lubricating eye drop    . polyethylene glycol (MIRALAX / GLYCOLAX) packet Take 17 Amy by mouth daily as needed for mild constipation.     . Vitamin D, Ergocalciferol, (DRISDOL) 50000 UNITS CAPS capsule Take 50,000 Units by mouth every Sunday.      No current facility-administered medications for this encounter.     No Known Allergies  Social History   Socioeconomic History  . Marital status: Married    Spouse name: Not on file  . Number of children: Not  on file  . Years of education: Not on file  . Highest education level: Not on file  Occupational History  . Not on file  Social Needs  . Financial resource strain: Not on file  . Food insecurity:    Worry: Not on file    Inability: Not on file  . Transportation needs:    Medical: Not on file    Non-medical: Not on file  Tobacco Use  . Smoking status: Never Smoker  . Smokeless tobacco: Never Used  Substance and Sexual Activity  . Alcohol use: No  . Drug use: No  . Sexual activity: Not on  file  Lifestyle  . Physical activity:    Days per week: Not on file    Minutes per session: Not on file  . Stress: Not on file  Relationships  . Social connections:    Talks on phone: Not on file    Gets together: Not on file    Attends religious service: Not on file    Active member of club or organization: Not on file    Attends meetings of clubs or organizations: Not on file    Relationship status: Not on file  . Intimate partner violence:    Fear of current or ex partner: Not on file    Emotionally abused: Not on file    Physically abused: Not on file    Forced sexual activity: Not on file  Other Topics Concern  . Not on file  Social History Narrative  . Not on file    Family History  Problem Relation Age of Onset  . Diabetes Mellitus II Mother   . Diabetes Mellitus II Father    The patient does not have a history of early familial atrial fibrillation or other arrhythmias.  ROS- All systems are reviewed and negative except as per the HPI above.  Physical Exam: Vitals:   08/22/18 1033  BP: (!) 124/58  Pulse: 74  Weight: 88.7 kg  Height: 5\' 7"  (1.702 m)    GEN- The patient is well appearing, alert and oriented x 3 today.   Head- normocephalic, atraumatic Eyes-  Sclera clear, conjunctiva pink Ears- hearing intact Oropharynx- clear Neck- supple  Lungs- Clear to ausculation bilaterally, normal work of breathing Heart- Regular rate and rhythm, no murmurs, rubs or gallops, occasional ectopic beat heard. GI- soft, NT, ND, + BS Extremities- no clubbing, cyanosis, or edema MS- no significant deformity or atrophy Skin- no rash or lesion Psych- euthymic mood, full affect Neuro- strength and sensation are intact  Wt Readings from Last 3 Encounters:  08/22/18 88.7 kg  04/05/18 88.7 kg  01/28/18 89.8 kg    EKG today demonstrates SR HR 74, PVC, PACs, blocked PAC, PR 124, QRS 78, QTc 412  Epic records are reviewed at length today  Assessment and Plan:  1.  Paroxysmal atrial fibrillation New diagnosis 08/20/18 on ER ECG. Currently on metoprolol 25 mg BID. Unsure if this is the cause of her symptoms given she feels poorly today and she is in SR.  This patients CHA2DS2-VASc Score and unadjusted Ischemic Stroke Rate (% per year) is equal to 2.2 % stroke rate/year from a score of 2  Above score calculated as 1 point each if present [CHF, HTN, DM, Vascular=MI/PAD/Aortic Plaque, Age if 65-74, or Female] Above score calculated as 2 points each if present [Age > 75, or Stroke/TIA/TE]  A long discussion with the patient was had today regarding therapeutic strategies.  Extensive discussion of  lifestyle modification including testing for OSA, regular activity, and limiting caffiene was also discussed.  Presently, our recommendations include: Will check echocardiogram. Will order Zio patch to see if her symptoms correlate to her arrhythmia.  We discussed her stroke risk as well as her risk of significant bleeding. Given her recent GI and varicose vein bleeding requiring transfusion, patient prefers to hold off on starting anticoagulation at this time.  2. Obesity Body mass index is 30.64 kg/m. As above, lifestyle modification was discussed at length including regular exercise and weight reduction.  3. HTN Stable today, no changes  4. Daytime somnolence/snoring Patient is very fatigued during the day and her husband notes that she does snore and occasionally stops breathing at night. Will arrange for sleep study.  Follow up in the Afib clinic in 4 weeks.  Elvina Sidle Matthew [facility] [address] [place], [place] [address] [phone]

## 2018-08-22 NOTE — Patient Instructions (Signed)
Take monitor off on 1/27 and mail in   Scheduling will set up sleep study once approval from insurance received

## 2018-08-24 ENCOUNTER — Telehealth: Payer: Self-pay | Admitting: *Deleted

## 2018-08-24 ENCOUNTER — Other Ambulatory Visit: Payer: Self-pay | Admitting: Nurse Practitioner

## 2018-08-24 DIAGNOSIS — R0683 Snoring: Secondary | ICD-10-CM

## 2018-08-24 DIAGNOSIS — I480 Paroxysmal atrial fibrillation: Secondary | ICD-10-CM

## 2018-08-24 NOTE — Telephone Encounter (Signed)
Patient notified [employer] denied In lab sleep study. Approved HST. hST appointment scheduled 09/05/18 @ 3:00 pm. Patient voiced understanding.

## 2018-08-24 NOTE — Telephone Encounter (Signed)
-----   Message from Shona Simpson, RN sent at 08/22/2018 11:49 AM EST ----- Regarding: sleep study Pt for sleep study for afib, snoring, daytime somnolence per clint fenton pa Thanks!Kennyth Arnold

## 2018-08-30 ENCOUNTER — Ambulatory Visit: Payer: No Typology Code available for payment source | Admitting: Cardiovascular Disease

## 2018-09-02 ENCOUNTER — Ambulatory Visit (HOSPITAL_COMMUNITY)
Admission: RE | Admit: 2018-09-02 | Discharge: 2018-09-02 | Disposition: A | Discharge status: Home / Self Care | Payer: BLUE CROSS/BLUE SHIELD | Source: Ambulatory Visit | Attending: Nurse Practitioner | Admitting: Nurse Practitioner

## 2018-09-02 DIAGNOSIS — I313 Pericardial effusion (noninflammatory): Secondary | ICD-10-CM | POA: Insufficient documentation

## 2018-09-02 DIAGNOSIS — I083 Combined rheumatic disorders of mitral, aortic and tricuspid valves: Secondary | ICD-10-CM | POA: Diagnosis not present

## 2018-09-02 DIAGNOSIS — I480 Paroxysmal atrial fibrillation: Secondary | ICD-10-CM | POA: Diagnosis present

## 2018-09-02 NOTE — Progress Notes (Signed)
*  PRELIMINARY RESULTS* Echocardiogram 2D Echocardiogram has been performed.  Amy Beard 09/02/2018, 8:59 AM

## 2018-09-05 ENCOUNTER — Ambulatory Visit (HOSPITAL_BASED_OUTPATIENT_CLINIC_OR_DEPARTMENT_OTHER): Payer: BLUE CROSS/BLUE SHIELD

## 2018-09-07 ENCOUNTER — Ambulatory Visit (HOSPITAL_BASED_OUTPATIENT_CLINIC_OR_DEPARTMENT_OTHER): Payer: BLUE CROSS/BLUE SHIELD | Attending: Nurse Practitioner

## 2018-09-12 ENCOUNTER — Other Ambulatory Visit: Payer: Self-pay | Admitting: Nurse Practitioner

## 2018-09-12 ENCOUNTER — Encounter (HOSPITAL_COMMUNITY): Payer: Self-pay | Admitting: Physician Assistant

## 2018-09-12 ENCOUNTER — Ambulatory Visit (HOSPITAL_COMMUNITY)
Admission: RE | Admit: 2018-09-12 | Discharge: 2018-09-12 | Disposition: A | Discharge status: Home / Self Care | Payer: BLUE CROSS/BLUE SHIELD | Source: Ambulatory Visit | Attending: Physician Assistant | Admitting: Physician Assistant

## 2018-09-12 VITALS — BP 138/82 | HR 66 | Ht 67.0 in | Wt 197.0 lb

## 2018-09-12 DIAGNOSIS — Z79899 Other long term (current) drug therapy: Secondary | ICD-10-CM | POA: Diagnosis not present

## 2018-09-12 DIAGNOSIS — I10 Essential (primary) hypertension: Secondary | ICD-10-CM | POA: Diagnosis not present

## 2018-09-12 DIAGNOSIS — R400 Somnolence: Secondary | ICD-10-CM | POA: Insufficient documentation

## 2018-09-12 DIAGNOSIS — Z6830 Body mass index (BMI) 30.0-30.9, adult: Secondary | ICD-10-CM | POA: Diagnosis not present

## 2018-09-12 DIAGNOSIS — R0683 Snoring: Secondary | ICD-10-CM | POA: Insufficient documentation

## 2018-09-12 DIAGNOSIS — E669 Obesity, unspecified: Secondary | ICD-10-CM | POA: Insufficient documentation

## 2018-09-12 DIAGNOSIS — R0681 Apnea, not elsewhere classified: Secondary | ICD-10-CM

## 2018-09-12 DIAGNOSIS — I083 Combined rheumatic disorders of mitral, aortic and tricuspid valves: Secondary | ICD-10-CM | POA: Diagnosis not present

## 2018-09-12 DIAGNOSIS — R5383 Other fatigue: Secondary | ICD-10-CM

## 2018-09-12 DIAGNOSIS — I480 Paroxysmal atrial fibrillation: Secondary | ICD-10-CM | POA: Insufficient documentation

## 2018-09-12 DIAGNOSIS — I071 Rheumatic tricuspid insufficiency: Secondary | ICD-10-CM | POA: Diagnosis not present

## 2018-09-12 DIAGNOSIS — I313 Pericardial effusion (noninflammatory): Secondary | ICD-10-CM | POA: Insufficient documentation

## 2018-09-12 DIAGNOSIS — Z833 Family history of diabetes mellitus: Secondary | ICD-10-CM | POA: Insufficient documentation

## 2018-09-12 NOTE — Progress Notes (Signed)
Primary Care Physician: Pearson Grippe, MD Referring Physician: Referred from [facility] ER   Amy Beard is a 63 y.o. female with a history of paroxysmal atrial fibrillation who presents for consultation in the [facility].  The patient was initially diagnosed with atrial fibrillation at the ER on 08/20/18. She does report cough and congestion recently and she was started on a Z-Pak.  Prior to presenting to the ER, she got up to go the bathroom and felt like she was going to pass out. She reports that she has been feeling much more fatigued and short of breath over the last month. These symptoms are persistent. She denies any palpitations or heart racing. Of note, she has a history of significant GI and varicose vein bleeding.  On follow up today, she reports that she has continued to feel fatigued with some mild improvement. Her Zio patch showed <1% afib with longest episode 43 seconds. Her echo showed moderate pericardial effusion w/o tamponade. Sleep study is pending.   Today, she denies symptoms of palpitations, chest pain, shortness of breath, orthopnea, PND, syncope, or neurologic sequela. The patient is tolerating medications without difficulties and is otherwise without complaint today.    Atrial Fibrillation Risk Factors:  she does have symptoms or diagnosis of sleep apnea.  she does not have a history of rheumatic fever.  she does not have a history of alcohol use.  she has a BMI of Body mass index is 30.85 kg/m.Marland Kitchen Filed Weights   09/12/18 0848  Weight: 89.4 kg      Atrial Fibrillation Management history:  Previous antiarrhythmic drugs: none  Previous cardioversions: none  Previous ablations: none  CHADS2VASC score: 2 (female, HTN)  Anticoagulation history: none 2/2 GI bleeding   Past Medical History:  Diagnosis Date  . GI bleeding 04/2018  . Hypertension    Past Surgical History:  Procedure Laterality Date  . COLONOSCOPY WITH  PROPOFOL N/A 04/05/2018   Procedure: COLONOSCOPY WITH PROPOFOL;  Surgeon: Sherrilyn Rist, MD;  Location[facility] ENDOSCOPY;  Service: Gastroenterology;  Laterality: N/A;  . ESOPHAGOGASTRODUODENOSCOPY (EGD) WITH PROPOFOL N/A 04/05/2018   Procedure: ESOPHAGOGASTRODUODENOSCOPY (EGD) WITH PROPOFOL;  Surgeon: Sherrilyn Rist, MD;  Location: [facility] ENDOSCOPY;  Service: Gastroenterology;  Laterality: N/A;  . VARICOSE VEIN SURGERY      Current Outpatient Medications  Medication Sig Dispense Refill  . acetaminophen (TYLENOL) 500 MG tablet Take 1,500 mg by mouth daily as needed for mild pain, moderate pain, fever or headache.     . albuterol (PROVENTIL HFA;VENTOLIN HFA) 108 (90 Base) MCG/ACT inhaler Inhale 1-2 puffs into the lungs every 6 (six) hours as needed for wheezing or shortness of breath.    . esomeprazole (NEXIUM) 20 MG capsule Take 20 mg by mouth daily.     . metoprolol tartrate (LOPRESSOR) 25 MG tablet Take 1 tablet (25 mg total) by mouth 2 (two) times daily. 60 tablet 0  . OVER THE COUNTER MEDICATION Place 1 drop into both eyes daily as needed (dry eyes). Over the counter lubricating eye drop    . polyethylene glycol (MIRALAX / GLYCOLAX) packet Take 17 g by mouth daily as needed for mild constipation.     . Vitamin D, Ergocalciferol, (DRISDOL) 50000 UNITS CAPS capsule Take 50,000 Units by mouth every Sunday.      No current facility-administered medications for this encounter.     No Known Allergies  Social History   Socioeconomic History  . Marital status:  Married    Spouse name: Not on file  . Number of children: Not on file  . Years of education: Not on file  . Highest education level: Not on file  Occupational History  . Not on file  Social Needs  . Financial resource strain: Not on file  . Food insecurity:    Worry: Not on file    Inability: Not on file  . Transportation needs:    Medical: Not on file    Non-medical: Not on file  Tobacco Use  . Smoking status: Never Smoker    . Smokeless tobacco: Never Used  Substance and Sexual Activity  . Alcohol use: No  . Drug use: No  . Sexual activity: Not on file  Lifestyle  . Physical activity:    Days per week: Not on file    Minutes per session: Not on file  . Stress: Not on file  Relationships  . Social connections:    Talks on phone: Not on file    Gets together: Not on file    Attends religious service: Not on file    Active member of club or organization: Not on file    Attends meetings of clubs or organizations: Not on file    Relationship status: Not on file  . Intimate partner violence:    Fear of current or ex partner: Not on file    Emotionally abused: Not on file    Physically abused: Not on file    Forced sexual activity: Not on file  Other Topics Concern  . Not on file  Social History Narrative  . Not on file    Family History  Problem Relation Age of Onset  . Diabetes Mellitus II Mother   . Diabetes Mellitus II Father    The patient does not have a history of early familial atrial fibrillation or other arrhythmias.  ROS- All systems are reviewed and negative except as per the HPI above.  Physical Exam: Vitals:   09/12/18 0848  BP: 138/82  Pulse: 66  Weight: 89.4 kg  Height: 5\' 7"  (1.702 m)    GEN- The patient is well appearing, alert and oriented x 3 today.   HEENT-head normocephalic, atraumatic, sclera clear, conjunctiva pink, hearing intact, trachea midline. Lungs- Clear to ausculation bilaterally, normal work of breathing Heart- Regular rate and rhythm, no murmurs, rubs or gallops  GI- soft, NT, ND, + BS Extremities- no clubbing, cyanosis, or edema MS- no significant deformity or atrophy Skin- no rash or lesion Psych- euthymic mood, full affect Neuro- strength and sensation are intact   Wt Readings from Last 3 Encounters:  09/12/18 89.4 kg  09/05/18 88.5 kg  08/22/18 88.7 kg    EKG today demonstrates SR HR 66, PR 122, QRS 80, QTc 421  Echo 09/02/18 1. The left  ventricle has normal systolic function of 60-65%. The cavity size is normal. There is mild left ventricular wall thickness. Echo evidence of normal diastolic filling patterns.  2. Moderately dilated left atrial size.  3. Normal right atrial size.  4. Moderate pericardial effusion, as described above.  5. The mitral valve mildly thickened. Regurgitation is mild to moderate by color flow Doppler.  6. Normal tricuspid valve.  7. Tricuspid regurgitation moderate.  8. The aortic valve tricuspid. There is mild thickening and mild calcification of the aortic valve.  9. No atrial level shunt detected by color flow Doppler.  Epic records are reviewed at length today  Assessment and Plan:  1.  Paroxysmal atrial fibrillation New diagnosis 08/20/18 on ER ECG. Currently on metoprolol 25 mg BID. In SR today. EF 60-65% on echocardiogram. Zio patch showed <1% afib with longest episode 43 seconds. I don't think her fatigue can be entirely explained by her afib given low burden. Continue Lopressor 25 mg BID  We discussed her stroke risk as well as her risk of significant bleeding. Given her recent GI and varicose vein bleeding requiring transfusion, patient prefers to not be on anticoagulation at this time.  This patients CHA2DS2-VASc Score and unadjusted Ischemic Stroke Rate (% per year) is equal to 2.2 % stroke rate/year from a score of 2  Above score calculated as 1 point each if present [CHF, HTN, DM, Vascular=MI/PAD/Aortic Plaque, Age if 65-74, or Female] Above score calculated as 2 points each if present [Age > 75, or Stroke/TIA/TE]   2. Obesity Body mass index is 30.85 kg/m. As above, lifestyle modification was discussed at length including regular exercise and weight reduction.  3. HTN Stable today, no changes  4. Valvular disease Mod TR by last echo. Will refer to cardiology.  5. Pericardial effusion No evidence of tamponade. Will refer to cardiology.  6. Daytime  somnolence/snoring Patient is very fatigued during the day and her husband notes that she does snore and occasionally stops breathing at night. Sleep study completed. Read pending.   Will refer to general cardiology for evaluation.  Jorja Loaicky Annabelle Rexroad PA-C [facility] [address] [place], [place] [address] [phone]

## 2018-09-14 ENCOUNTER — Ambulatory Visit (HOSPITAL_BASED_OUTPATIENT_CLINIC_OR_DEPARTMENT_OTHER): Payer: BLUE CROSS/BLUE SHIELD | Attending: Nurse Practitioner | Admitting: Cardiovascular Disease

## 2018-09-14 VITALS — Ht 67.0 in | Wt 195.0 lb

## 2018-09-14 DIAGNOSIS — R0902 Hypoxemia: Secondary | ICD-10-CM | POA: Diagnosis not present

## 2018-09-14 DIAGNOSIS — R0681 Apnea, not elsewhere classified: Secondary | ICD-10-CM

## 2018-09-14 DIAGNOSIS — R0683 Snoring: Secondary | ICD-10-CM

## 2018-09-14 DIAGNOSIS — G4733 Obstructive sleep apnea (adult) (pediatric): Secondary | ICD-10-CM | POA: Diagnosis present

## 2018-09-14 DIAGNOSIS — R5383 Other fatigue: Secondary | ICD-10-CM

## 2018-09-19 ENCOUNTER — Other Ambulatory Visit (HOSPITAL_COMMUNITY): Payer: Self-pay | Admitting: *Deleted

## 2018-09-19 MED ORDER — METOPROLOL TARTRATE 25 MG PO TABS: 25.0000 mg | ORAL_TABLET | Freq: Two times a day (BID) | ORAL | 6 refills | Status: DC

## 2018-09-24 ENCOUNTER — Encounter (HOSPITAL_BASED_OUTPATIENT_CLINIC_OR_DEPARTMENT_OTHER): Payer: Self-pay | Admitting: Cardiovascular Disease

## 2018-09-24 NOTE — Procedures (Signed)
    Patient Name: Amy Beard, Amy Beard Date: 09/14/2018 Gender: Female D.O.B: 1956-01-28 Age (years): 62 Referring Provider: Newman Nip Height (inches): 67 Interpreting Physician: Nicki Guadalajara MD, ABSM Weight (lbs): 195 RPSGT: Bonita Quin BMI: 31 MRN: [id number] Neck Size: 14.50  CLINICAL INFORMATION Sleep Study Type: NPSG  Indication for sleep study: Fatigue, Snoring, witnessed apnea, atrial fibrillation  Epworth Sleepiness Score: 2  SLEEP STUDY TECHNIQUE As per the AASM Manual for the Scoring of Sleep and Associated Events v2.3 (April 2016) with a hypopnea requiring 4% desaturations.  The channels recorded and monitored were frontal, central and occipital EEG, electrooculogram (EOG), submentalis EMG (chin), nasal and oral airflow, thoracic and abdominal wall motion, anterior tibialis EMG, snore microphone, electrocardiogram, and pulse oximetry.  MEDICATIONS Medications self-administered by patient taken the night of the study : N/A  SLEEP ARCHITECTURE The study was initiated at 10:21:02 PM and ended at 4:33:20 AM.  Sleep onset time was 9.7 minutes and the sleep efficiency was 89.5%%. The total sleep time was 333.1 minutes.  Stage REM latency was 47.0 minutes.  The patient spent 23.4%% of the night in stage N1 sleep, 43.1%% in stage N2 sleep, 12.9%% in stage N3 and 20.6% in REM.  Alpha intrusion was absent.  Supine sleep was 99.40%.  RESPIRATORY PARAMETERS The overall apnea/hypopnea index (AHI) was 5.0 per hour.  The respiratory disturbance index (RDI) was 20.4/h. There were 1 total apneas, including 0 obstructive, 1 central and 0 mixed apneas. There were 27 hypopneas and 85 RERAs.  The AHI during Stage REM sleep was 12.3 per hour.  AHI while supine was 5.1 per hour.  The mean oxygen saturation was 97.2%. The minimum SpO2 during sleep was 89.0%.  Moderate snoring was noted during this study.  CARDIAC DATA The 2 lead EKG demonstrated sinus rhythm.  The mean heart rate was 69.9 beats per minute. Other EKG findings include: None.  LEG MOVEMENT DATA The total PLMS were 0 with a resulting PLMS index of 0.0. Associated arousal with leg movement index was 0.0 .  IMPRESSIONS - Mild obstructive sleep apnea overall (AHI 5.0/h, although RDI 20.4/h). Events were more significant during REM sleep (AHI 12.3/h) - No significant central sleep apnea occurred during this study (CAI = 0.2/h). - Mild oxygen desaturation to a nadir of 89%. - The patient snored with moderate snoring volume. - The arousal index was moderateely abnormal - No cardiac abnormalities were noted during this study. - Clinically significant periodic limb movements did not occur during sleep. No significant associated arousals.  DIAGNOSIS - Obstructive Sleep Apnea (327.23 [G47.33 ICD-10]) - Nocturnal Hypoxemia (327.26 [G47.36 ICD-10])  RECOMMENDATIONS - In this patient with significant cardiovascular co-morbidities including hypertension and PAF recommend a CPAP titration study for optimal treatment of the patient's sleep disordered breathing. Alternatives to CPAP include a customized dental appliance.  - Efforts should be made to optimize nasal and orophayrngeal patency.  - Avoid alcohol, sedatives and other CNS depressants that may worsen sleep apnea and disrupt normal sleep architecture. - Sleep hygiene should be reviewed to assess factors that may improve sleep quality. - Weight management and regular exercise should be initiated or continued if appropriate.  [Electronically signed] 09/24/2018 02:13 PM  Nicki Guadalajara MD, [facility], ABSM Diplomate, American Board of Sleep Medicine   NPI: [id number] [facility] PH: [phone]   FX: [phone] ACCREDITED BY THE AMERICAN ACADEMY OF SLEEP MEDICINE

## 2018-09-27 ENCOUNTER — Telehealth: Payer: Self-pay | Admitting: *Deleted

## 2018-09-27 NOTE — Progress Notes (Signed)
Patient notified of sleep study results and recommendations. 

## 2018-09-27 NOTE — Telephone Encounter (Signed)
CPAP referral faxed to [facility].

## 2018-09-27 NOTE — Telephone Encounter (Signed)
Patient returned a call to me and was given sleep study results and recommendations. She agrees to proceed with titration study.

## 2018-09-27 NOTE — Telephone Encounter (Signed)
-----   Message from Lennette Bihari, MD sent at 09/24/2018  2:17 PM EST ----- Amy Beard, please notify the patient the results of her sleep study.  Meant CPAP titration study particularly with history of atrial fibrillation.  Alternatives can include customized oral appliance.

## 2018-09-27 NOTE — Telephone Encounter (Signed)
Left message with husband to have patient to return a call to me.

## 2018-09-28 ENCOUNTER — Encounter (INDEPENDENT_AMBULATORY_CARE_PROVIDER_SITE_OTHER): Payer: Self-pay

## 2018-09-28 ENCOUNTER — Encounter: Payer: Self-pay | Admitting: Interventional Cardiology

## 2018-09-28 ENCOUNTER — Encounter

## 2018-09-28 ENCOUNTER — Ambulatory Visit: Payer: BLUE CROSS/BLUE SHIELD | Admitting: Interventional Cardiology

## 2018-09-28 VITALS — BP 132/78 | HR 80 | Ht 67.0 in | Wt 198.6 lb

## 2018-09-28 DIAGNOSIS — I480 Paroxysmal atrial fibrillation: Secondary | ICD-10-CM | POA: Diagnosis not present

## 2018-09-28 DIAGNOSIS — K625 Hemorrhage of anus and rectum: Secondary | ICD-10-CM | POA: Diagnosis not present

## 2018-09-28 DIAGNOSIS — G4733 Obstructive sleep apnea (adult) (pediatric): Secondary | ICD-10-CM | POA: Diagnosis not present

## 2018-09-28 DIAGNOSIS — I313 Pericardial effusion (noninflammatory): Secondary | ICD-10-CM

## 2018-09-28 DIAGNOSIS — I3139 Other pericardial effusion (noninflammatory): Secondary | ICD-10-CM | POA: Insufficient documentation

## 2018-09-28 NOTE — Patient Instructions (Addendum)
Medication Instructions:  Your physician recommends that you continue on your current medications as directed. Please refer to the Current Medication list given to you today.  If you need a refill on your cardiac medications before your next appointment, please call your pharmacy.   Lab work: Sed Rate, CRP, ANA, Rheumatoid Factor, Quantiferon-TB tomorrow morning If you have labs (blood work) drawn today and your tests are completely normal, you will receive your results only by: Marland Kitchen MyChart Message (if you have MyChart) OR . A paper copy in the mail If you have any lab test that is abnormal or we need to change your treatment, we will call you to review the results.  Testing/Procedures: Your physician has requested that you have an limited echocardiogram tomorrow at 8:30 AM, arrive at 8:00 AM. Echocardiography is a painless test that uses sound waves to create images of your heart. It provides your doctor with information about the size and shape of your heart and how well your heart's chambers and valves are working. This procedure takes approximately one hour. There are no restrictions for this procedure.  Follow-Up: . Follow up with Dr Katrinka Blazing on 10/31/18 at 3:40 PM   Any Other Special Instructions Will Be Listed Below (If Applicable).

## 2018-09-28 NOTE — Progress Notes (Signed)
Cardiology Office Note:    Date:  09/28/2018   ID:  Amy Beard, DOB Jun 23, 1956, MRN [id number]  PCP:  Virgilio Belling, PA-C  Cardiologist:  No primary care provider on file.   Referring MD: Danice Goltz, PA   Chief Complaint  Patient presents with  . Advice Only    Pericardial effusion  . Atrial Fibrillation    History of Present Illness:    Amy Beard is a 63 y.o. female with a hx of paroxysmal atrial fibrillation, moderate pericardial effusion, less than 1% atrial fibrillation burden on ZIO patch, and Chads Vasc of 2.  Shareeta is being seen for evaluation.  She was seen initially in A. fib clinic after an episode of atrial fibrillation was managed in the emergency department at [facility] on August 20, 2018.  When seen in the A. fib clinic, a 7-day monitor was ordered and an echocardiogram was performed.  The echocardiogram demonstrated moderate circumferential pericardial effusion without evidence of tamponade.  The monitor revealed less than 1 minute of atrial fibrillation burden.  Anticoagulation therapy was never started.  Chads Vasc. is 2.  A sleep study was also performed and is positive for mild obstructive sleep apnea.  Over the past 3 to 6 months she has had exertional dyspnea.  She and her husband play golf and he noticed that she had difficulty with physical activity and feeling extremely tired.  She has had dry hacking cough for several months.  She denies chills and fever.  She has malar hypopigmentation.  Patient has no history of heart disease.  She does have deformity of her fingers and nodules possibly raising the concern for inflammatory arthritis.  No history of sarcoid, lupus, or other chronic inflammatory diseases.  No recent significant upper respiratory illness.    Past Medical History:  Diagnosis Date  . GI bleeding 04/2018  . Hypertension     Past Surgical History:  Procedure Laterality Date  . COLONOSCOPY WITH PROPOFOL N/A  04/05/2018   Procedure: COLONOSCOPY WITH PROPOFOL;  Surgeon: Sherrilyn Rist, MD;  Location: [facility] ENDOSCOPY;  Service: Gastroenterology;  Laterality: N/A;  . ESOPHAGOGASTRODUODENOSCOPY (EGD) WITH PROPOFOL N/A 04/05/2018   Procedure: ESOPHAGOGASTRODUODENOSCOPY (EGD) WITH PROPOFOL;  Surgeon: Sherrilyn Rist, MD;  Location: [facility] ENDOSCOPY;  Service: Gastroenterology;  Laterality: N/A;  . VARICOSE VEIN SURGERY      Current Medications: Current Meds  Medication Sig  . acetaminophen (TYLENOL) 500 MG tablet Take 1,500 mg by mouth daily as needed for mild pain, moderate pain, fever or headache.   . albuterol (PROVENTIL HFA;VENTOLIN HFA) 108 (90 Base) MCG/ACT inhaler Inhale 1-2 puffs into the lungs every 6 (six) hours as needed for wheezing or shortness of breath.  . clobetasol ointment (TEMOVATE) 0.05 % Apply to affected area twice daily for up to 2 weeks.  Marland Kitchen esomeprazole (NEXIUM) 20 MG capsule Take 20 mg by mouth daily.   . metoprolol tartrate (LOPRESSOR) 25 MG tablet Take 1 tablet (25 mg total) by mouth 2 (two) times daily.  Marland Kitchen OVER THE COUNTER MEDICATION Place 1 drop into both eyes daily as needed (dry eyes). Over the counter lubricating eye drop  . polyethylene glycol (MIRALAX / GLYCOLAX) packet Take 17 g by mouth daily as needed for mild constipation.   . Vitamin D, Ergocalciferol, (DRISDOL) 50000 UNITS CAPS capsule Take 50,000 Units by mouth every Sunday.      Allergies:   Patient has no known allergies.   Social History  Socioeconomic History  . Marital status: Married    Spouse name: Not on file  . Number of children: Not on file  . Years of education: Not on file  . Highest education level: Not on file  Occupational History  . Not on file  Social Needs  . Financial resource strain: Not on file  . Food insecurity:    Worry: Not on file    Inability: Not on file  . Transportation needs:    Medical: Not on file    Non-medical: Not on file  Tobacco Use  . Smoking status: Never  Smoker  . Smokeless tobacco: Never Used  Substance and Sexual Activity  . Alcohol use: No  . Drug use: No  . Sexual activity: Not on file  Lifestyle  . Physical activity:    Days per week: Not on file    Minutes per session: Not on file  . Stress: Not on file  Relationships  . Social connections:    Talks on phone: Not on file    Gets together: Not on file    Attends religious service: Not on file    Active member of club or organization: Not on file    Attends meetings of clubs or organizations: Not on file    Relationship status: Not on file  Other Topics Concern  . Not on file  Social History Narrative  . Not on file     Family History: The patient's family history includes Diabetes Mellitus II in her father and mother.  ROS:   Please see the history of present illness.    .  All other systems reviewed and are negative.  EKGs/Labs/Other Studies Reviewed:    The following studies were reviewed today:  2D Doppler echocardiogram September 02, 2018: IMPRESSIONS 1. The left ventricle has normal systolic function of 60-65%. The cavity size is normal. There is mild left ventricular wall thickness. Echo evidence of normal diastolic filling patterns.  2. Moderately dilated left atrial size.  3. Normal right atrial size.  4. Moderate pericardial effusion, as described above.  5. The mitral valve mildly thickened. Regurgitation is mild to moderate by color flow Doppler.  6. Normal tricuspid valve.  7. Tricuspid regurgitation moderate.  8. The aortic valve tricuspid. There is mild thickening and mild calcification of the aortic valve.  9. No atrial level shunt detected by color flow Doppler.  14-day long-term monitor 09/06/2018: Study Highlights   Sinus rhythm Average heart rate 79 bpm (range 40-150 bpm) Frequent premature atrial contractions (PACs) Rarely PACs occur in bigeminy and are not conducted Episodes of nonsustained atrial tachycardia are noted Short episodes of  atrial fibrillation (45 seconds) with rapid ventricular rates are noted (overall burden <1%)   SLEEP STUDY 09/14/2018: IMPRESSIONS - Mild obstructive sleep apnea overall (AHI 5.0/h, although RDI 20.4/h). Events were more significant during REM sleep (AHI 12.3/h) - No significant central sleep apnea occurred during this study (CAI = 0.2/h). - Mild oxygen desaturation to a nadir of 89%. - The patient snored with moderate snoring volume. - The arousal index was moderateely abnormal - No cardiac abnormalities were noted during this study. - Clinically significant periodic limb movements did not occur during sleep. No significant associated arousals.  DIAGNOSIS - Obstructive Sleep Apnea (327.23 [G47.33 ICD-10]) - Nocturnal Hypoxemia (327.26 [G47.36 ICD-10])  RECOMMENDATIONS - In this patient with significant cardiovascular co-morbidities including hypertension and PAF recommend a CPAP titration study for optimal treatment of the patient's sleep disordered breathing. Alternatives to  CPAP include a customized dental appliance.  - Efforts should be made to optimize nasal and orophayrngeal patency.  - Avoid alcohol, sedatives and other CNS depressants that may worsen sleep apnea and disrupt normal sleep architecture. - Sleep hygiene should be reviewed to assess factors that may improve sleep quality. - Weight management and regular exercise should be initiated or continued if appropriate.  2 VIEW CHEST X-RAY performed 08/20/2018: CHEST - 2 VIEW  COMPARISON:  None.  FINDINGS: The heart size and mediastinal contours are within normal limits. Both lungs are clear. The visualized skeletal structures are unremarkable.  IMPRESSION: No active cardiopulmonary disease.   EKG:  EKG performed on 09/12/2018 is normal.  Recent Labs: 08/20/2018: ALT 13; BUN 9; Creatinine, Ser 0.73; Hemoglobin 11.2; Magnesium 2.0; Platelets 309; Potassium 3.5; Sodium 140; TSH 1.635  Recent Lipid Panel No results  found for: CHOL, TRIG, HDL, CHOLHDL, VLDL, LDLCALC, LDLDIRECT  Physical Exam:    VS:  BP 132/78   Pulse 80   Ht  (1.702 m)   Wt 198 lb 9.6 oz (90.1 kg)   SpO2 99%   BMI 31.11 kg/m     Wt Readings from Last 3 Encounters:  09/28/18 198 lb 9.6 oz (90.1 kg)  09/14/18 195 lb (88.5 kg)  09/12/18 197 lb (89.4 kg)     GEN: Moderate obesity. No acute distress HEENT: Normal NECK: Mild bilateral JVD lying at 45 degrees LYMPHATICS: No lymphadenopathy CARDIAC: RRR.  No murmur, S4 igallop, trace bilateral ankle edema VASCULAR: 2+ bilateral radial and carotid pulses, no bruits RESPIRATORY:  Clear to auscultation without rales, wheezing or rhonchi  ABDOMEN: Soft, non-tender, non-distended, No pulsatile mass, MUSCULOSKELETAL: No deformity  SKIN: Warm and dry NEUROLOGIC:  Alert and oriented x 3 PSYCHIATRIC:  Normal affect   ASSESSMENT:    1. Pericardial effusion   2. Paroxysmal atrial fibrillation (HCC)   3. OSA (obstructive sleep apnea)   4. Rectal bleeding    PLAN:    In order of problems listed above:  1. Etiology is uncertain.  Repeat limited echo will be done soon to determine if effusion was transient and now resolving or still present or perhaps worsening.  Work-up should be performed to exclude connective tissue disease, TB, hypothyroidism, and perhaps she will need to have pericardiocentesis performed for additional information.  May need chest CT to exclude lymphadenopathy.  She gets regular mammograms but has not had one yet this year. 2. Low burden of atrial fibrillation.  History of significant rectal bleeding with requirement for transfusion.  Anticoagulation therapy not started because burden is low and bleeding risk is high. 3. Recent diagnosis.  She still has to undergo CPAP titration.  Obstructive sleep apnea was graded as mild.  We had significant discussion concerning pericarditis/pericardial effusion.  An inflammatory work-up will be done.  Anti-inflammatory  therapy may be started depending upon the size of the effusion and status compared to 4 weeks ago.  May need diagnostic pericardiocentesis.  Clinical follow-up in 2 to 4 weeks depending upon findings from up coming laboratory data and repeat echocardiogram.   Medication Adjustments/Labs and Tests Ordered: Current medicines are reviewed at length with the patient today.  Concerns regarding medicines are outlined above.  Orders Placed This Encounter  Procedures  . Sedimentation rate  . C-reactive protein  . Antinuclear Antib (ANA)  . Rheumatoid Factor  . QuantiFERON-TB Gold Plus  . ECHOCARDIOGRAM LIMITED   No orders of the defined types were placed in this encounter.  Patient Instructions  Medication Instructions:  Your physician recommends that you continue on your current medications as directed. Please refer to the Current Medication list given to you today.  If you need a refill on your cardiac medications before your next appointment, please call your pharmacy.   Lab work: Sed Rate, CRP, ANA, Rheumatoid Factor, Quantiferon-TB tomorrow morning If you have labs (blood work) drawn today and your tests are completely normal, you will receive your results only by: Marland Kitchen MyChart Message (if you have MyChart) OR . A paper copy in the mail If you have any lab test that is abnormal or we need to change your treatment, we will call you to review the results.  Testing/Procedures: Your physician has requested that you have an limited echocardiogram tomorrow at 8:30 AM, arrive at 8:00 AM. Echocardiography is a painless test that uses sound waves to create images of your heart. It provides your doctor with information about the size and shape of your heart and how well your heart's chambers and valves are working. This procedure takes approximately one hour. There are no restrictions for this procedure.  Follow-Up: . Follow up with Dr Katrinka Blazing on 10/31/18 at 3:40 PM   Any Other Special  Instructions Will Be Listed Below (If Applicable).     Signed, Lesleigh Noe, MD  09/28/2018 5:20 PM    [facility]

## 2018-09-29 ENCOUNTER — Ambulatory Visit (HOSPITAL_COMMUNITY): Payer: BLUE CROSS/BLUE SHIELD | Attending: Internal Medicine

## 2018-09-29 ENCOUNTER — Other Ambulatory Visit: Payer: BLUE CROSS/BLUE SHIELD | Admitting: *Deleted

## 2018-09-29 ENCOUNTER — Other Ambulatory Visit: Payer: Self-pay | Admitting: Interventional Cardiology

## 2018-09-29 DIAGNOSIS — I3139 Other pericardial effusion (noninflammatory): Secondary | ICD-10-CM

## 2018-09-29 DIAGNOSIS — I313 Pericardial effusion (noninflammatory): Secondary | ICD-10-CM | POA: Diagnosis present

## 2018-09-29 DIAGNOSIS — I480 Paroxysmal atrial fibrillation: Secondary | ICD-10-CM | POA: Diagnosis present

## 2018-09-29 DIAGNOSIS — K625 Hemorrhage of anus and rectum: Secondary | ICD-10-CM | POA: Diagnosis present

## 2018-09-30 ENCOUNTER — Telehealth: Payer: Self-pay | Admitting: *Deleted

## 2018-09-30 DIAGNOSIS — I313 Pericardial effusion (noninflammatory): Secondary | ICD-10-CM

## 2018-09-30 DIAGNOSIS — I3139 Other pericardial effusion (noninflammatory): Secondary | ICD-10-CM

## 2018-09-30 MED ORDER — COLCHICINE 0.6 MG PO CAPS: ORAL_CAPSULE | ORAL | 1 refills | Status: DC

## 2018-09-30 NOTE — Telephone Encounter (Signed)
Spoke with pt and made her aware of results and recommendations per Dr. Katrinka Blazing.  Pt verbalized understanding and was in agreement with this plan.

## 2018-09-30 NOTE — Telephone Encounter (Signed)
-----   Message from Lyn Records, MD sent at 09/30/2018  9:22 AM EST ----- Let the patient know the echocardiogram demonstrates that the pericardial effusion is smaller but still present.  Start colchicine 0.6 mg twice daily for 2 weeks then decrease to 0.6 mg daily.  This therapy will be continued for a while and in 2 to 3 months we will repeat an echocardiogram to determine if the effusion has resolved.  No blood work results have returned. A copy will be sent to Virgilio Belling, PA-C

## 2018-10-01 LAB — QUANTIFERON-TB GOLD PLUS
QUANTIFERON NIL VALUE: 0.02 [IU]/mL
QuantiFERON Mitogen Value: 5.9 IU/mL
QuantiFERON TB1 Ag Value: 0.02 IU/mL
QuantiFERON TB2 Ag Value: 0.02 IU/mL
QuantiFERON-TB Gold Plus: NEGATIVE

## 2018-10-01 LAB — C-REACTIVE PROTEIN: CRP: <1 mg/L (ref 0–10)

## 2018-10-01 LAB — ANA: Anti Nuclear Antibody(ANA): POSITIVE — AB

## 2018-10-01 LAB — SEDIMENTATION RATE: Sed Rate: 35 mm/hr (ref 0–40)

## 2018-10-01 LAB — RHEUMATOID FACTOR: Rheumatoid fact SerPl-aCnc: <10 IU/mL (ref 0.0–13.9)

## 2018-10-06 ENCOUNTER — Other Ambulatory Visit: Payer: Self-pay | Admitting: *Deleted

## 2018-10-06 ENCOUNTER — Telehealth: Payer: Self-pay | Admitting: *Deleted

## 2018-10-06 DIAGNOSIS — I313 Pericardial effusion (noninflammatory): Secondary | ICD-10-CM

## 2018-10-06 DIAGNOSIS — I3139 Other pericardial effusion (noninflammatory): Secondary | ICD-10-CM

## 2018-10-06 DIAGNOSIS — R768 Other specified abnormal immunological findings in serum: Secondary | ICD-10-CM

## 2018-10-06 NOTE — Telephone Encounter (Signed)
Spoke with pt and went over results and recommendations per Dr. Smith.  Pt verbalized understanding and was in agreement with this plan.  

## 2018-10-06 NOTE — Telephone Encounter (Signed)
-----   Message from Lyn Records, MD sent at 10/06/2018 12:59 PM EST ----- Let the patient know the only abnormal test is ANA.  She needs additional laboratory testing with a systemic lupus panel to determine if she has SLE. A copy will be sent to Virgilio Belling, PA-C

## 2018-10-08 LAB — SYSTEMIC LUPUS PROFILE B
Anti Nuclear Antibody(ANA): POSITIVE — AB
Chromatin Ab SerPl-aCnc: 6.9 AI — ABNORMAL HIGH (ref 0.0–0.9)
Complement C3, Serum: 101 mg/dL (ref 82–167)
Complement C4, Serum: 13 mg/dL — ABNORMAL LOW (ref 14–44)
dsDNA Ab: 1 IU/mL (ref 0–9)

## 2018-10-08 LAB — SYSTEMIC LUPUS PROFILE A
Chromatin Ab SerPl-aCnc: 6.8 AI — ABNORMAL HIGH (ref 0.0–0.9)
ENA RNP Ab: >8 AI — ABNORMAL HIGH (ref 0.0–0.9)
ENA SM Ab Ser-aCnc: 5.1 AI — ABNORMAL HIGH (ref 0.0–0.9)
ENA SSA (RO) Ab: 0.5 AI (ref 0.0–0.9)
ENA SSB (LA) Ab: <0.2 AI (ref 0.0–0.9)
Rheumatoid fact SerPl-aCnc: <10 IU/mL (ref 0.0–13.9)
dsDNA Ab: 1 IU/mL (ref 0–9)

## 2018-10-14 ENCOUNTER — Telehealth: Payer: Self-pay | Admitting: *Deleted

## 2018-10-14 DIAGNOSIS — R768 Other specified abnormal immunological findings in serum: Secondary | ICD-10-CM

## 2018-10-14 NOTE — Telephone Encounter (Signed)
Spoke with pt and went over results and recommendations per Dr. Katrinka Blazing.  Pt verbalized understanding and was in agreement with this plan.  Referral placed for Rheumatology.

## 2018-10-14 NOTE — Telephone Encounter (Signed)
-----   Message from Lyn Records, MD sent at 10/11/2018  4:29 PM EDT ----- Let the patient know she may have Lupus Erythematosus and we need to have her seen by Rheumatologist ASAP. This is the explanation for the fluid in the sack around her heart. A copy will be sent to Virgilio Belling, PA-C

## 2018-10-20 ENCOUNTER — Encounter: Payer: Self-pay | Admitting: Interventional Cardiology

## 2018-10-22 ENCOUNTER — Other Ambulatory Visit: Payer: Self-pay | Admitting: Interventional Cardiology

## 2018-10-27 ENCOUNTER — Telehealth: Payer: Self-pay | Admitting: Interventional Cardiology

## 2018-10-27 DIAGNOSIS — R768 Other specified abnormal immunological findings in serum: Secondary | ICD-10-CM

## 2018-10-27 NOTE — Telephone Encounter (Signed)
   Please add colchicine to her medication list.  She needs a follow-up echo in May to reassess pericardial effusion.  Find out when rheumatology appointment will occur.  If she feels okay relative to breathing, edema, and exertional tolerance I can see her in May after the echo.  If she is short of breath or having worsened symptoms she will need to have a virtual visit or office visit on this occasion.

## 2018-10-27 NOTE — Telephone Encounter (Signed)
Spoke with pt and she denies any issues at this time.  States breathing is better.  Feels comfortable postponing appt.  Rescheduled pt to see Dr. Katrinka Blazing 01/04/2019.  Scheduled echo for 5/26.  Pt aware to contact the office if any issues prior to appts.    Spoke with pt about Rheumatology referral.  She states Dr. Fatima Sanger office is not in her network.  Advised I would place referral to the [facility] system as requested.

## 2018-10-28 ENCOUNTER — Other Ambulatory Visit: Payer: Self-pay | Admitting: Interventional Cardiology

## 2018-10-28 ENCOUNTER — Telehealth: Payer: Self-pay | Admitting: Interventional Cardiology

## 2018-10-28 MED ORDER — COLCHICINE 0.6 MG PO TABS: 0.6000 mg | ORAL_TABLET | Freq: Every day | ORAL | 3 refills | Status: DC

## 2018-10-28 MED ORDER — MITIGARE 0.6 MG PO CAPS: 0.6000 mg | ORAL_CAPSULE | Freq: Every day | ORAL | 3 refills | Status: DC

## 2018-10-28 NOTE — Telephone Encounter (Signed)
I have done a Mitigare tier exception through covermymeds. Key: [id number]  It is doubtful that a tier exception will be granted as the pt has not tried and failed Allopurinol.  Will forward to Dr Katrinka Blazing and his nurse for consideration until tier exception is approved or denied as the pt got her last refill on 2/28 and is due for next refill.

## 2018-10-28 NOTE — Telephone Encounter (Signed)
**Note De-Identified Betsie Peckman Obfuscation** I called [facility] and was advised that the pt paid $113 for a 30 day supply of Mitigare on 09/30/2018 and that a PA is not required as the pts insurance is paying their part. I asked if I could send the RX in as the generic Colchicine to see if it would be less expensive.  Per the pharmacist when she re-ran the RX for Colchicine she got a message that it is not covered under the pts plan but that name brand Mitigare is. The pharmacist states that Mitigare is an expensive medication even with insurance coverage.  I am going to attempt a tier exception.

## 2018-10-28 NOTE — Telephone Encounter (Signed)
°*  STAT* If patient is at the pharmacy, call can be transferred to refill team.   1. Which medications need to be refilled? (please list name of each medication and dose if known)  MITIGARE 0.6 MG CAPS  2. Which pharmacy/location (including street and city if local pharmacy) is medication to be sent to? [id number] [place], [place] - [address]  3. Do they need a 30 day or 90 day supply? 30  Husband of Pt went to pick up rx from Pharmacy, and Pharmacy told him the new rx wasn't ready yet

## 2018-10-28 NOTE — Telephone Encounter (Signed)
Encounter not needed

## 2018-10-28 NOTE — Telephone Encounter (Signed)
**Note De-Identified Amy Beard Obfuscation** I called [employer] and s/w Court[place]. Per Toni Amend the pt has a $700 deductible and that once she meets that the cost for her Mitigare (which is a tier 2 medication so a tier exception is not allowed) will be $25 for a 30 day supply.

## 2018-10-28 NOTE — Telephone Encounter (Signed)
It is being used for pericarditis. Thanks for helping [place].

## 2018-10-31 ENCOUNTER — Ambulatory Visit: Payer: BLUE CROSS/BLUE SHIELD | Admitting: Interventional Cardiology

## 2018-11-01 NOTE — Telephone Encounter (Signed)
**Note De-Identified [name] Obfuscation** I have called the number to reach the pts husband that is listed for this phone note and the numbers listed as the pts home and mobile phones but received the same message for all as follows: Your service does not allow calling to number dialed.  I am working from home today and using my personal phone to call the pt.  Will forward this message to Dr Lonn Georgia nurse to ask if she can reach the pt to see if she can afford her deductible.

## 2018-11-02 NOTE — Telephone Encounter (Signed)
I was able to reach the pts husband Amy Beard today. He states that our office called the RX in again and that the pt picked up her Mitigare on 3/27 without any issues.  He thanked me for calling to check on his wife.

## 2018-12-20 ENCOUNTER — Telehealth (HOSPITAL_COMMUNITY): Payer: Self-pay | Admitting: *Deleted

## 2018-12-20 NOTE — Telephone Encounter (Signed)
Talked to patient about change of appointment time.She voiced understanding.

## 2018-12-22 ENCOUNTER — Telehealth: Payer: Self-pay

## 2018-12-22 NOTE — Telephone Encounter (Signed)
YOUR CARDIOLOGY TEAM HAS ARRANGED FOR AN E-VISIT FOR YOUR APPOINTMENT - PLEASE REVIEW IMPORTANT INFORMATION BELOW SEVERAL DAYS PRIOR TO YOUR APPOINTMENT  Due to the recent COVID-19 pandemic, we are transitioning in-person office visits to tele-medicine visits in an effort to decrease unnecessary exposure to our patients, their families, and staff. These visits are billed to your insurance just like a normal visit is. We also encourage you to sign up for MyChart if you have not already done so. You will need a smartphone if possible. For patients that do not have this, we can still complete the visit using a regular telephone but do prefer a smartphone to enable video when possible. You may have a family member that lives with you that can help. If possible, we also ask that you have a blood pressure cuff and scale at home to measure your blood pressure, heart rate and weight prior to your scheduled appointment. Patients with clinical needs that need an in-person evaluation and testing will still be able to come to the office if absolutely necessary. If you have any questions, feel free to call our office.     YOUR PROVIDER WILL BE USING THE FOLLOWING PLATFORM TO COMPLETE YOUR VISIT: Doximity  . IF USING MYCHART - How to Download the MyChart App to Your SmartPhone   - If Apple, go to App Store and type in MyChart in the search bar and download the app. If Android, ask patient to go to Google Play Store and type in MyChart in the search bar and download the app. The app is free but as with any other app downloads, your phone may require you to verify saved payment information or Apple/Android password.  - You will need to then log into the app with your MyChart username and password, and select [facility] as your healthcare provider to link the account.  - When it is time for your visit, go to the MyChart app, find appointments, and click Begin Video Visit. Be sure to Select Allow for your device to  access the Microphone and Camera for your visit. You will then be connected, and your provider will be with you shortly.  **If you have any issues connecting or need assistance, please contact MyChart service desk [phone] ([phone])**  **If using a computer, in order to ensure the best quality for your visit, you will need to use either of the following Internet Browsers: Google Chrome or Microsoft Edge**  . IF USING DOXIMITY or DOXY.ME - The staff will give you instructions on receiving your link to join the meeting the day of your visit.      2-3 DAYS BEFORE YOUR APPOINTMENT  You will receive a telephone call from one of our [facility] team members - your caller ID may say "Unknown caller." If this is a video visit, we will walk you through how to get the video launched on your phone. We will remind you check your blood pressure, heart rate and weight prior to your scheduled appointment. If you have an Apple Watch or Kardia, please upload any pertinent ECG strips the day before or morning of your appointment to MyChart. Our staff will also make sure you have reviewed the consent and agree to move forward with your scheduled tele-health visit.     THE DAY OF YOUR APPOINTMENT  Approximately 15 minutes prior to your scheduled appointment, you will receive a telephone call from one of HeartCare team - your caller ID may say "Unknown caller."    Our staff will confirm medications, vital signs for the day and any symptoms you may be experiencing. Please have this information available prior to the time of visit start. It may also be helpful for you to have a pad of paper and pen handy for any instructions given during your visit. They will also walk you through joining the smartphone meeting if this is a video visit.    CONSENT FOR TELE-HEALTH VISIT - PLEASE REVIEW  I hereby voluntarily request, consent and authorize [facility] and its employed or contracted physicians, physician  assistants, nurse practitioners or other licensed health care professionals (the Practitioner), to provide me with telemedicine health care services (the "Services") as deemed necessary by the treating Practitioner. I acknowledge and consent to receive the Services by the Practitioner via telemedicine. I understand that the telemedicine visit will involve communicating with the Practitioner through live audiovisual communication technology and the disclosure of certain medical information by electronic transmission. I acknowledge that I have been given the opportunity to request an in-person assessment or other available alternative prior to the telemedicine visit and am voluntarily participating in the telemedicine visit.  I understand that I have the right to withhold or withdraw my consent to the use of telemedicine in the course of my care at any time, without affecting my right to future care or treatment, and that the Practitioner or I may terminate the telemedicine visit at any time. I understand that I have the right to inspect all information obtained and/or recorded in the course of the telemedicine visit and may receive copies of available information for a reasonable fee.  I understand that some of the potential risks of receiving the Services via telemedicine include:  . Delay or interruption in medical evaluation due to technological equipment failure or disruption; . Information transmitted may not be sufficient (e.g. poor resolution of images) to allow for appropriate medical decision making by the Practitioner; and/or  . In rare instances, security protocols could fail, causing a breach of personal health information.  Furthermore, I acknowledge that it is my responsibility to provide information about my medical history, conditions and care that is complete and accurate to the best of my ability. I acknowledge that Practitioner's advice, recommendations, and/or decision may be based on  factors not within their control, such as incomplete or inaccurate data provided by me or distortions of diagnostic images or specimens that may result from electronic transmissions. I understand that the practice of medicine is not an exact science and that Practitioner makes no warranties or guarantees regarding treatment outcomes. I acknowledge that I will receive a copy of this consent concurrently upon execution via email to the email address I last provided but may also request a printed copy by calling the office of [facility].    I understand that my insurance will be billed for this visit.   I have read or had this consent read to me. . I understand the contents of this consent, which adequately explains the benefits and risks of the Services being provided via telemedicine.  . I have been provided ample opportunity to ask questions regarding this consent and the Services and have had my questions answered to my satisfaction. . I give my informed consent for the services to be provided through the use of telemedicine in my medical care  By participating in this telemedicine visit I agree to the above.  

## 2018-12-23 ENCOUNTER — Telehealth (HOSPITAL_COMMUNITY): Payer: Self-pay | Admitting: Radiology

## 2018-12-23 NOTE — Telephone Encounter (Signed)
COVID-19 Pre-Screening Questions:  . Do you currently have a fever? NO (yes = cancel and refer to pcp for e-visit) . Have you recently travelled on a cruise, internationally, or to [place], [place], [place], [place], [place], or [place], [place] [employer]) ? NO (yes = cancel, stay home, monitor symptoms, and contact pcp or initiate e-visit if symptoms develop) . Have you been in contact with someone that is currently pending confirmation of Covid19 testing or has been confirmed to have the Covid19 virus?No (yes = cancel, stay home, away from tested individual, monitor symptoms, and contact pcp or initiate e-visit if symptoms develop) . Are you currently experiencing fatigue or cough? NO (yes = pt should be prepared to have a mask placed at the time of their visit). . Reiterated no additional visitors. [place] no earlier than 15 minutes before appointment time. . Please bring own mask.

## 2018-12-27 ENCOUNTER — Telehealth: Payer: Self-pay | Admitting: Nurse Practitioner

## 2018-12-27 ENCOUNTER — Other Ambulatory Visit: Payer: Self-pay

## 2018-12-27 ENCOUNTER — Ambulatory Visit (HOSPITAL_COMMUNITY): Payer: BLUE CROSS/BLUE SHIELD | Attending: Interventional Cardiology

## 2018-12-27 DIAGNOSIS — I313 Pericardial effusion (noninflammatory): Secondary | ICD-10-CM | POA: Insufficient documentation

## 2018-12-27 DIAGNOSIS — I3139 Other pericardial effusion (noninflammatory): Secondary | ICD-10-CM

## 2018-12-27 NOTE — Telephone Encounter (Signed)
Patient was in the office today for echocardiogram. She complained to Tammie, echo tech that she has been having increased fluttering in her chest. Tammie reported to DOD pod to report to Dr. Elease Hashimoto and I went to exam room to examine her. She is here today for repeat echo due to hx of pericardial effusion in February; reports hx of a fib. She reports an occasional "fleeting" pain in her chest. I asked about adherence to medications and she confirms taking metoprolol 25 mg twice daily sates she cannot take blood thinner due to hx of diverticulitis. Her heart rate in the office was 64 bpm and irregular. Patient is in NAD and was advised that a note will be forwarded to Dr. Katrinka Blazing and his nurse, Victorino Dike, and that they will follow-up with her, possibly after the results of the echo are available. Patient verbalized agreement with plan and thanked me for coming over to talk to her.

## 2018-12-27 NOTE — Telephone Encounter (Signed)
Thanks

## 2019-01-04 ENCOUNTER — Telehealth: Payer: BLUE CROSS/BLUE SHIELD | Admitting: Interventional Cardiology

## 2019-01-10 ENCOUNTER — Telehealth: Payer: Self-pay

## 2019-01-10 ENCOUNTER — Telehealth: Payer: Self-pay | Admitting: Interventional Cardiology

## 2019-01-10 NOTE — Telephone Encounter (Signed)
New Message     Dr Georga Bora is calling to speak with Dr Tamala Julian in regards to the pt     Please call her back on her cell phone [phone]

## 2019-01-10 NOTE — Telephone Encounter (Signed)
Spoke with Dr. Manuella Ghazi and she would like to speak to Dr. Tamala Julian directly.  Advised I will send message to Dr. Tamala Julian.

## 2019-01-10 NOTE — Telephone Encounter (Signed)

## 2019-01-12 ENCOUNTER — Ambulatory Visit: Payer: BLUE CROSS/BLUE SHIELD | Admitting: Interventional Cardiology

## 2019-01-12 ENCOUNTER — Other Ambulatory Visit: Payer: Self-pay

## 2019-01-12 ENCOUNTER — Encounter: Payer: Self-pay | Admitting: Interventional Cardiology

## 2019-01-12 VITALS — BP 138/72 | HR 83 | Ht 67.0 in | Wt 204.8 lb

## 2019-01-12 DIAGNOSIS — R768 Other specified abnormal immunological findings in serum: Secondary | ICD-10-CM | POA: Diagnosis not present

## 2019-01-12 DIAGNOSIS — I313 Pericardial effusion (noninflammatory): Secondary | ICD-10-CM

## 2019-01-12 DIAGNOSIS — I480 Paroxysmal atrial fibrillation: Secondary | ICD-10-CM

## 2019-01-12 DIAGNOSIS — Z7189 Other specified counseling: Secondary | ICD-10-CM

## 2019-01-12 DIAGNOSIS — I071 Rheumatic tricuspid insufficiency: Secondary | ICD-10-CM

## 2019-01-12 DIAGNOSIS — G4733 Obstructive sleep apnea (adult) (pediatric): Secondary | ICD-10-CM | POA: Diagnosis not present

## 2019-01-12 DIAGNOSIS — I3139 Other pericardial effusion (noninflammatory): Secondary | ICD-10-CM

## 2019-01-12 DIAGNOSIS — I10 Essential (primary) hypertension: Secondary | ICD-10-CM

## 2019-01-12 MED ORDER — POTASSIUM CHLORIDE CRYS ER 20 MEQ PO TBCR: 20.0000 meq | EXTENDED_RELEASE_TABLET | Freq: Every day | ORAL | 3 refills | Status: DC

## 2019-01-12 MED ORDER — HYDROCHLOROTHIAZIDE 12.5 MG PO CAPS: 12.5000 mg | ORAL_CAPSULE | Freq: Every day | ORAL | 3 refills | Status: DC

## 2019-01-12 NOTE — Progress Notes (Signed)
Cardiology Office Note:    Date:  01/12/2019   ID:  Amy Beard, DOB 1955/11/03, MRN [id number]  PCP:  Virgilio Bellingsborne, Tirrany T, PA-C  Cardiologist:  No primary care provider on file.   Referring MD: Virgilio Bellingsborne, Tirrany T, PA-C   Chief Complaint  Patient presents with   Hospitalization Follow-up    Pericardial effusion    History of Present Illness:    Amy Beard is a 63 y.o. female with a hx of  with a hx of paroxysmal atrial fibrillation, moderate pericardial effusion, positive ANA (ENA), Sed rate 35, C4 complement low at 13 less than 1% atrial fibrillation burden on ZIO patch, and Chads Vasc of 2.  Suspect underlying systemic inflammatory disease such as lupus or rheumatoid arthritis.  Amy Beard feels better than when I saw her 3 months ago.  The cough, and orthopnea have resolved.  She still has stiffness and swelling in her hands and joints.  The initial work-up of the pericardial effusion that we found demonstrated elevated inflammatory markers and evidence of possible autoimmune disease.  Colchicine 0.6 mg/day after the initial course of twice daily has led to resolution of cough, orthopnea, and a decrease in the size of the pericardial effusion.  I have not been able to use nonsteroidal anti-inflammatory therapy because of a prior history of GI bleeding and caution against using aspirin and other therapies.  Since seeing me she has been evaluated by a rheumatologist, [facility] Hard[facility]Hajra [facility].  Multiple additional immune/autoimmune serology was obtained.  Chest x-ray was done shows a globular heart suggesting pericardial effusion.  I do see a note in the chart that Dr. Clelia CroftShaw attempted to contact me on 01/10/2019.  Her note states that she feels the patient likely has lupus.  Past Medical History:  Diagnosis Date   GI bleeding 04/2018   Hypertension     Past Surgical History:  Procedure Laterality Date   COLONOSCOPY WITH PROPOFOL N/A  04/05/2018   Procedure: COLONOSCOPY WITH PROPOFOL;  Surgeon: Sherrilyn Ristanis, Janiylah Hannis L III, MD;  [facility];  Service: Gastroenterology;  Laterality: N/A;   ESOPHAGOGASTRODUODENOSCOPY (EGD) WITH PROPOFOL N/A 04/05/2018   Procedure: ESOPHAGOGASTRODUODENOSCOPY (EGD) WITH PROPOFOL;  Surgeon: Sherrilyn Ristanis, Rafeef Lau L III, MD;  [facility];  Service: Gastroenterology;  Laterality: N/A;   VARICOSE VEIN SURGERY      Current Medications: Current Meds  Medication Sig   acetaminophen (TYLENOL) 500 MG tablet Take 1,500 mg by mouth daily as needed for mild pain, moderate pain, fever or headache.    albuterol (PROVENTIL HFA;VENTOLIN HFA) 108 (90 Base) MCG/ACT inhaler Inhale 1-2 puffs into the lungs every 6 (six) hours as needed for wheezing or shortness of breath.   clobetasol ointment (TEMOVATE) 0.05 % Apply to affected area twice daily for up to 2 weeks.   esomeprazole (NEXIUM) 20 MG capsule Take 20 mg by mouth daily.    metoprolol tartrate (LOPRESSOR) 25 MG tablet Take 1 tablet (25 mg total) by mouth 2 (two) times daily.   MITIGARE 0.6 MG CAPS Take 0.6 mg by mouth daily.   OVER THE COUNTER MEDICATION Place 1 drop into both eyes daily as needed (dry eyes). Over the counter lubricating eye drop   polyethylene glycol (MIRALAX / GLYCOLAX) packet Take 17 g by mouth daily as needed for mild constipation.    vitamin B-12 (CYANOCOBALAMIN) 1000 MCG tablet Take 1,000 mcg by mouth daily.   VITAMIN D, ERGOCALCIFEROL, PO Take 1,000 Units by mouth  daily.      Allergies:   Patient has no known allergies.   Social History   Socioeconomic History   Marital status: Married    Spouse name: Not on file   Number of children: Not on file   Years of education: Not on file   Highest education level: Not on file  Occupational History   Not on file  Social Needs   Financial resource strain: Not on file   Food insecurity    Worry: Not on file    Inability: Not on file   Transportation needs     Medical: Not on file    Non-medical: Not on file  Tobacco Use   Smoking status: Never Smoker   Smokeless tobacco: Never Used  Substance and Sexual Activity   Alcohol use: No   Drug use: No   Sexual activity: Not on file  Lifestyle   Physical activity    Days per week: Not on file    Minutes per session: Not on file   Stress: Not on file  Relationships   Social connections    Talks on phone: Not on file    Gets together: Not on file    Attends religious service: Not on file    Active member of club or organization: Not on file    Attends meetings of clubs or organizations: Not on file    Relationship status: Not on file  Other Topics Concern   Not on file  Social History Narrative   Not on file     Family History: The patient's family history includes Diabetes Mellitus II in her father and mother.  ROS:   Please see the history of present illness.    She feels better.  Wants to get more activity such as playing golf with her husband.  All other systems reviewed and are negative.  EKGs/Labs/Other Studies Reviewed:    The following studies were reviewed today:  2D Doppler echocardiogram May 2020: IMPRESSIONS    1. The left ventricle has normal systolic function, with an ejection fraction of 60-65%. The cavity size was normal. There is mild concentric left ventricular hypertrophy. Left ventricular diastolic Doppler parameters are consistent with impaired  relaxation.  2. The right ventricle has normal systolc function. The cavity was normal. There is no increase in right ventricular wall thickness.  3. Small to moderate.  4. The pericardial effusion is circumferential.  5. Compared with the echo 09/2018, pericardial effusion is smaller and measures up to 1.9 cm. No RV diastolic collapse noted. >25% respiratory flow variation across the mitral and tricuspid valves. IVC is not dilated and demonstrates normal collapse.  Findings are not consistent with  tamponade.  6. No evidence of mitral valve stenosis.  7. No stenosis of the aortic valve.  8. Pulmonic valve regurgitation was not assessed by color flow Doppler.  9. The ascending aorta is normal in size and structure.   EKG:  EKG not repeated  Recent Labs: 08/20/2018: ALT 13; BUN 9; Creatinine, Ser 0.73; Hemoglobin 11.2; Magnesium 2.0; Platelets 309; Potassium 3.5; Sodium 140; TSH 1.635  Recent Lipid Panel No results found for: CHOL, TRIG, HDL, CHOLHDL, VLDL, LDLCALC, LDLDIRECT  Physical Exam:    VS:  BP 138/72    Pulse 83    Ht 5\' 7"  (1.702 m)    Wt 204 lb 12.8 oz (92.9 kg)    SpO2 97%    BMI 32.08 kg/m     Wt Readings from Last 3  Encounters:  01/12/19 204 lb 12.8 oz (92.9 kg)  09/28/18 198 lb 9.6 oz (90.1 kg)  09/14/18 195 lb (88.5 kg)    Pressure 160/98 without pulses paradoxus. GEN: Obese black female. No acute distress HEENT: Normal NECK: No JVD. LYMPHATICS: No lymphadenopathy CARDIAC: RRR.  No murmur, no gallop, there is no pericardial rub and there is no peripheral edema VASCULAR: 2+ bilateral pulses, no bruits RESPIRATORY:  Clear to auscultation without rales, wheezing or rhonchi  ABDOMEN: Soft, non-tender, non-distended, No pulsatile mass, MUSCULOSKELETAL: No deformity  SKIN: Warm and dry NEUROLOGIC:  Alert and oriented x 3 PSYCHIATRIC:  Normal affect   ASSESSMENT:    1. Pericardial effusion   2. Positive ANA (antinuclear antibody)   3. OSA (obstructive sleep apnea)   4. Paroxysmal atrial fibrillation (HCC)   5. Moderate tricuspid regurgitation   6. Educated About Covid-19 Virus Infection    PLAN:    In order of problems listed above:  1. Improvement in symptoms related to the pericardial effusion.  Resolution of cough, orthopnea, and dyspnea on exertion.  Pete echo on colchicine therapy has demonstrated a reduction in the size of the pericardial effusion.  For this reason, I believe the pericardial effusion is inflammatory.  The size would make  pericardiocentesis difficult and have a higher complication rate especially considering her body habitus which include significant abdominal obesity.  If possible I would like to start nonsteroidal anti-inflammatory therapy but since she has had bleeding issues with aspirin I have been reluctant to do so.  Therapy for her autoimmune disease will likely help the effusion to resolve completely. 2. Elevated and with of the markers suggesting possible lupus erythematosus. 3. Not discussed. 4. Related to pericardial disease. 5. Start HCTZ 12.5 mg/day along with K. Dur 20 mEq/day to better control her blood pressure.  2 g sodium diet.  Avoid nonsteroidal anti-inflammatory therapy.  Overall, the patient is significantly improved on anti-inflammatory therapy with colchicine.  We have decided to continue colchicine for the time being.  Low-dose HCTZ is being started to treat hypertension.  She is being followed by Dr. Sherryll BurgerShah and perhaps will be started on therapy for autoimmune disease.  I predict that the pericardial effusion will go away with therapy of the underlying systemic inflammatory disease.  At this time I do not believe that pericardiocentesis is safe given her body habitus and the small to moderate sized effusion that is present.   Medication Adjustments/Labs and Tests Ordered: Current medicines are reviewed at length with the patient today.  Concerns regarding medicines are outlined above.  No orders of the defined types were placed in this encounter.  No orders of the defined types were placed in this encounter.   There are no Patient Instructions on file for this visit.   Signed, Lesleigh NoeHenry W Gabriele Zwilling III, MD  01/12/2019 3:21 PM    [facility]

## 2019-01-12 NOTE — Patient Instructions (Addendum)
Medication Instructions:  1) START Hydrochlorothiazide 12.5mg  once daily 2) START Potassium 76mEq once daily  If you need a refill on your cardiac medications before your next appointment, please call your pharmacy.   Lab work: Your physician recommends that you return for lab work in: 7-10 days  (BMET)  If you have labs (blood work) drawn today and your tests are completely normal, you will receive your results only by: Marland Kitchen MyChart Message (if you have MyChart) OR . A paper copy in the mail If you have any lab test that is abnormal or we need to change your treatment, we will call you to review the results.  Testing/Procedures: Your physician has requested that you have an echocardiogram in August. Echocardiography is a painless test that uses sound waves to create images of your heart. It provides your doctor with information about the size and shape of your heart and how well your heart's chambers and valves are working. This procedure takes approximately one hour. There are no restrictions for this procedure.    Follow-Up: At [facility], you and your health needs are our priority.  As part of our continuing mission to provide you with exceptional heart care, we have created designated Provider Care Teams.  These Care Teams include your primary Cardiologist (physician) and Advanced Practice Providers (APPs -  Physician Assistants and Nurse Practitioners) who all work together to provide you with the care you need, when you need it. You will need a follow up appointment in 3 months (can have 9/2 at 3:40P) .  Please call our office 2 months in advance to schedule this appointment.  You may see Dr. Tamala Julian or one of the following Advanced Practice Providers on your designated Care Team:   Truitt Merle, NP Cecilie Kicks, NP . Kathyrn Drown, NP  Any Other Special Instructions Will Be Listed Below (If Applicable).

## 2019-01-18 ENCOUNTER — Telehealth: Payer: Self-pay

## 2019-01-18 NOTE — Telephone Encounter (Signed)
    COVID-19 Pre-Screening Questions:  . In the past 7 to 10 days have you had a cough,  shortness of breath, headache, congestion, fever (100 or greater) body aches, chills, sore throat, or sudden loss of taste or sense of smell? NO . Have you been around anyone with known Covid 19.  NO . Have you been around anyone who is awaiting Covid 19 test results in the past 7 to 10 days?  NO . Have you been around anyone who has been exposed to Covid 19, or has mentioned symptoms of Covid 19 within the past 7 to 10 days?  NO  If you have any concerns/questions about symptoms patients report during screening (either on the phone or at threshold). Contact the provider seeing the patient or DOD for further guidance.  If neither are available contact a member of the leadership team.  Pt stated she will arrive on 19th  for labs. kb

## 2019-01-19 ENCOUNTER — Other Ambulatory Visit: Payer: BLUE CROSS/BLUE SHIELD

## 2019-01-20 ENCOUNTER — Other Ambulatory Visit: Payer: BLUE CROSS/BLUE SHIELD | Admitting: *Deleted

## 2019-01-20 ENCOUNTER — Other Ambulatory Visit: Payer: Self-pay

## 2019-01-20 DIAGNOSIS — I480 Paroxysmal atrial fibrillation: Secondary | ICD-10-CM

## 2019-01-20 DIAGNOSIS — I313 Pericardial effusion (noninflammatory): Secondary | ICD-10-CM

## 2019-01-20 DIAGNOSIS — I3139 Other pericardial effusion (noninflammatory): Secondary | ICD-10-CM

## 2019-01-20 LAB — BASIC METABOLIC PANEL
BUN/Creatinine Ratio: 24 (ref 12–28)
BUN: 17 mg/dL (ref 8–27)
CO2: 26 mmol/L (ref 20–29)
Calcium: 9.6 mg/dL (ref 8.7–10.3)
Chloride: 97 mmol/L (ref 96–106)
Creatinine, Ser: 0.71 mg/dL (ref 0.57–1.00)
GFR calc Af Amer: 106 mL/min/{1.73_m2} (ref >59)
GFR calc non Af Amer: 92 mL/min/{1.73_m2} (ref >59)
Glucose: 73 mg/dL (ref 65–99)
Potassium: 4.6 mmol/L (ref 3.5–5.2)
Sodium: 137 mmol/L (ref 134–144)

## 2019-02-21 ENCOUNTER — Other Ambulatory Visit: Payer: Self-pay | Admitting: Interventional Cardiology

## 2019-03-07 ENCOUNTER — Other Ambulatory Visit: Payer: Self-pay | Admitting: Physician Assistant

## 2019-03-07 DIAGNOSIS — Z1231 Encounter for screening mammogram for malignant neoplasm of breast: Secondary | ICD-10-CM

## 2019-03-09 ENCOUNTER — Telehealth: Payer: Self-pay | Admitting: Interventional Cardiology

## 2019-03-09 NOTE — Telephone Encounter (Signed)
Call received from Dr. Bobbye Riggs, pt's rheumatologist.  She was inquiring about holding pt's HCTZ due to DBP being 105.  Dr. Bobbye Riggs spoke with Dr. Angelena Form, DOD and he said ok to hold HCTZ.  Dr. Bobbye Riggs asked about replacement medication to help control BP.  Dr. Angelena Form said we would send a note to Dr. Tamala Julian to see if he felt pt needed to be placed on anything else for her BP.

## 2019-03-10 NOTE — Telephone Encounter (Signed)
I'm sorry, I meant SBP was 105.  Dr. Manuella Ghazi was wanting to know if we needed to replace the HCTZ with something else to control her BP but Dr. Angelena Form said we would reach out to you to see if it even needed a replacement since BP was on lower end.

## 2019-03-10 NOTE — Telephone Encounter (Signed)
SBP or DBP was 105 mmHg? Don't understand the request

## 2019-03-11 ENCOUNTER — Other Ambulatory Visit (HOSPITAL_COMMUNITY): Payer: Self-pay | Admitting: Physician Assistant

## 2019-03-14 ENCOUNTER — Other Ambulatory Visit: Payer: Self-pay

## 2019-03-14 ENCOUNTER — Ambulatory Visit (HOSPITAL_COMMUNITY): Payer: BLUE CROSS/BLUE SHIELD | Attending: Cardiology

## 2019-03-14 DIAGNOSIS — I3139 Other pericardial effusion (noninflammatory): Secondary | ICD-10-CM

## 2019-03-14 DIAGNOSIS — I313 Pericardial effusion (noninflammatory): Secondary | ICD-10-CM | POA: Diagnosis present

## 2019-03-15 ENCOUNTER — Other Ambulatory Visit: Payer: Self-pay | Admitting: *Deleted

## 2019-03-15 NOTE — Telephone Encounter (Signed)
Spoke with Amy Beard and went over recommendations per Dr. Tamala Julian.  She will monitor BP and let us know if consistently higher than 130/80.

## 2019-03-15 NOTE — Telephone Encounter (Signed)
Okay to see how she does but will need BP monitoring at home. [employer] 130/80 mmHg

## 2019-04-04 NOTE — Progress Notes (Signed)
Cardiology Office Note:    Date:  04/05/2019   ID:  Amy Beard, DOB 1955-08-04, MRN [id number]  PCP:  Andria Frames, PA-C  Cardiologist:  No primary care provider on file.   Referring MD: Andria Frames, PA-C   Chief Complaint  Patient presents with  . Advice Only    Lupus pericardial effusion    History of Present Illness:    Amy Beard is a 62 y.o. female with a hx of OSA, paroxysmal atrial fibrillation (1% burden),h/o GIB,  moderate pericardial effusion, SLE, on suppressive therapy, and essential hypertension.  He is now doing well.  Orthopnea, lower extremity swelling, and dyspnea on exertion has resolved.  She had moderate to large pericardial effusion which upon evaluation led to the diagnosis of systemic lupus erythematosus.  She sees Dr. Manuella Ghazi in [place].  She is on hydrochloric when.  A recent echo was repeated and is noted below.  Past Medical History:  Diagnosis Date  . GI bleeding 04/2018  . Hypertension     Past Surgical History:  Procedure Laterality Date  . COLONOSCOPY WITH PROPOFOL N/A 04/05/2018   Procedure: COLONOSCOPY WITH PROPOFOL;  Surgeon: Doran Stabler, MD;  Location: [place];  Service: Gastroenterology;  Laterality: N/A;  . ESOPHAGOGASTRODUODENOSCOPY (EGD) WITH PROPOFOL N/A 04/05/2018   Procedure: ESOPHAGOGASTRODUODENOSCOPY (EGD) WITH PROPOFOL;  Surgeon: Doran Stabler, MD;  Location: [place];  Service: Gastroenterology;  Laterality: N/A;  . VARICOSE VEIN SURGERY      Current Medications: Current Meds  Medication Sig  . acetaminophen (TYLENOL) 500 MG tablet Take 1,500 mg by mouth daily as needed for mild pain, moderate pain, fever or headache.   . albuterol (PROVENTIL HFA;VENTOLIN HFA) 108 (90 Base) MCG/ACT inhaler Inhale 1-2 puffs into the lungs every 6 (six) hours as needed for wheezing or shortness of breath.  Marland Kitchen BREO ELLIPTA 100-25 MCG/INH AEPB INHALE 1 PUFF INTO THE LUNGS DAILY FOR 30 DAYS.  . clobetasol  ointment (TEMOVATE) 0.05 % Apply to affected area twice daily for up to 2 weeks.  Marland Kitchen esomeprazole (NEXIUM) 20 MG capsule Take 20 mg by mouth daily.   . hydroxychloroquine (PLAQUENIL) 200 MG tablet Take 200 mg by mouth 2 (two) times daily.   . metoprolol tartrate (LOPRESSOR) 25 MG tablet TAKE 1 TABLET BY MOUTH TWICE A DAY  . MITIGARE 0.6 MG CAPS Take 1 capsule by mouth daily.  Marland Kitchen OVER THE COUNTER MEDICATION Place 1 drop into both eyes daily as needed (dry eyes). Over the counter lubricating eye drop  . polyethylene glycol (MIRALAX / GLYCOLAX) packet Take 17 g by mouth daily as needed for mild constipation.   . potassium chloride SA (K-DUR) 20 MEQ tablet Take 1 tablet (20 mEq total) by mouth daily.  . vitamin B-12 (CYANOCOBALAMIN) 1000 MCG tablet Take 1,000 mcg by mouth daily.  Marland Kitchen VITAMIN D, ERGOCALCIFEROL, PO Take 1,000 Units by mouth daily.      Allergies:   Patient has no known allergies.   Social History   Socioeconomic History  . Marital status: Married    Spouse name: Not on file  . Number of children: Not on file  . Years of education: Not on file  . Highest education level: Not on file  Occupational History  . Not on file  Social Needs  . Financial resource strain: Not on file  . Food insecurity    Worry: Not on file    Inability: Not on file  . Transportation needs  Medical: Not on file    Non-medical: Not on file  Tobacco Use  . Smoking status: Never Smoker  . Smokeless tobacco: Never Used  Substance and Sexual Activity  . Alcohol use: No  . Drug use: No  . Sexual activity: Not on file  Lifestyle  . Physical activity    Days per week: Not on file    Minutes per session: Not on file  . Stress: Not on file  Relationships  . Social Musician on phone: Not on file    Gets together: Not on file    Attends religious service: Not on file    Active member of club or organization: Not on file    Attends meetings of clubs or organizations: Not on file     Relationship status: Not on file  Other Topics Concern  . Not on file  Social History Narrative  . Not on file     Family History: The patient's family history includes Diabetes Mellitus II in her father and mother.  ROS:   Please see the history of present illness.    She is back playing golf with her husband, lower extremity swelling has completely resolved, she has 3 grandchildren, muscle aches and pains have improved.  All other systems reviewed and are negative.  EKGs/Labs/Other Studies Reviewed:    The following studies were reviewed today: 2D Doppler echocardiogram performed on 03/14/2019: IMPRESSIONS    1. The left ventricle has normal systolic function with an ejection fraction of 60-65%. The cavity size was normal. There is moderately increased left ventricular wall thickness. Left ventricular diastolic Doppler parameters are consistent with  pseudonormalization. Elevated left ventricular end-diastolic pressure.  2. The right ventricle has mildly reduced systolic function. The cavity was normal. There is no increase in right ventricular wall thickness. Right ventricular systolic pressure is moderately elevated at .  3. Left atrial size was mildly dilated.  4. Right atrial size was mildly dilated.  5. Tricuspid valve regurgitation is moderate.  6. The aortic valve is tricuspid. Mild sclerosis of the aortic valve.  7. The aorta is normal in size and structure.  8. Small pericardial effusion.  9. The pericardial effusion is circumferential.  EKG:  EKG not repeated  Recent Labs: 08/20/2018: ALT 13; Hemoglobin 11.2; Magnesium 2.0; Platelets 309; TSH 1.635 01/20/2019: BUN 17; Creatinine, Ser 0.71; Potassium 4.6; Sodium 137  Recent Lipid Panel No results found for: CHOL, TRIG, HDL, CHOLHDL, VLDL, LDLCALC, LDLDIRECT  Physical Exam:    VS:  BP 128/78   Pulse 84   Ht 5\' 7"  (1.702 m)   Wt 207 lb 6.4 oz (94.1 kg)   SpO2 99%   BMI 32.48 kg/m     Wt Readings from  Last 3 Encounters:  04/05/19 207 lb 6.4 oz (94.1 kg)  01/12/19 204 lb 12.8 oz (92.9 kg)  09/28/18 198 lb 9.6 oz (90.1 kg)     GEN: Moderate obesity. No acute distress HEENT: Normal NECK: No JVD. LYMPHATICS: No lymphadenopathy CARDIAC: No rub.  RRR without murmur, gallop, or edema. VASCULAR:  Normal Pulses. No bruits. RESPIRATORY:  Clear to auscultation without rales, wheezing or rhonchi  ABDOMEN: Soft, non-tender, non-distended, No pulsatile mass, MUSCULOSKELETAL: No deformity  SKIN: Warm and dry NEUROLOGIC:  Alert and oriented x 3 PSYCHIATRIC:  Normal affect   ASSESSMENT:    1. Pericardial effusion   2. Essential hypertension   3. Paroxysmal atrial fibrillation (HCC)   4. OSA (obstructive sleep apnea)  5. SLE (systemic lupus erythematosus related syndrome) (HCC)   6. Educated About Covid-19 Virus Infection    PLAN:    In order of problems listed above:  1. Pericardial effusion is still present but is small and circumferential.  No evidence of increased intrapericardial pressure on clinical exam.  Patient is back to normal activity.  Clinical follow-up in 1 year.  Warned that if she starts having lower extremity swelling or recurrent dyspnea that we should be concerned about the possibility of recurrent knee effusion.  I will plan to see her again in 1 year. 2. Blood pressure is well controlled. 3. No atrial arrhythmias or palpitations. 4. Encouraged CPAP. 5. Continue management per Dr. Sherryll BurgerShah.  Currently on hydrochloric when. 6. Stressed the importance of social distancing, handwashing, and mask wearing.  1 year follow-up.   Medication Adjustments/Labs and Tests Ordered: Current medicines are reviewed at length with the patient today.  Concerns regarding medicines are outlined above.  No orders of the defined types were placed in this encounter.  No orders of the defined types were placed in this encounter.   Patient Instructions  Medication Instructions:  Your  physician recommends that you continue on your current medications as directed. Please refer to the Current Medication list given to you today.  If you need a refill on your cardiac medications before your next appointment, please call your pharmacy.   Lab work: None If you have labs (blood work) drawn today and your tests are completely normal, you will receive your results only by: Marland Kitchen. MyChart Message (if you have MyChart) OR . A paper copy in the mail If you have any lab test that is abnormal or we need to change your treatment, we will call you to review the results.  Testing/Procedures: None  Follow-Up: At [facility], you and your health needs are our priority.  As part of our continuing mission to provide you with exceptional heart care, we have created designated Provider Care Teams.  These Care Teams include your primary Cardiologist (physician) and Advanced Practice Providers (APPs -  Physician Assistants and Nurse Practitioners) who all work together to provide you with the care you need, when you need it. You will need a follow up appointment in 12 months.  Please call our office 2 months in advance to schedule this appointment.  You may see Dr. Katrinka BlazingSmith or one of the following Advanced Practice Providers on your designated Care Team:   Norma FredricksonLori Gerhardt, NP Nada BoozerLaura Ingold, NP . Georgie ChardJill McDaniel, NP  Any Other Special Instructions Will Be Listed Below (If Applicable).       Signed, Lesleigh NoeHenry W Ernestine Langworthy III, MD  04/05/2019 3:59 PM    [facility]

## 2019-04-05 ENCOUNTER — Other Ambulatory Visit: Payer: Self-pay

## 2019-04-05 ENCOUNTER — Encounter: Payer: Self-pay | Admitting: Interventional Cardiology

## 2019-04-05 ENCOUNTER — Ambulatory Visit: Payer: BLUE CROSS/BLUE SHIELD | Admitting: Interventional Cardiology

## 2019-04-05 VITALS — BP 128/78 | HR 84 | Ht 67.0 in | Wt 207.4 lb

## 2019-04-05 DIAGNOSIS — G4733 Obstructive sleep apnea (adult) (pediatric): Secondary | ICD-10-CM | POA: Diagnosis not present

## 2019-04-05 DIAGNOSIS — M329 Systemic lupus erythematosus, unspecified: Secondary | ICD-10-CM

## 2019-04-05 DIAGNOSIS — I3139 Other pericardial effusion (noninflammatory): Secondary | ICD-10-CM

## 2019-04-05 DIAGNOSIS — I10 Essential (primary) hypertension: Secondary | ICD-10-CM

## 2019-04-05 DIAGNOSIS — I480 Paroxysmal atrial fibrillation: Secondary | ICD-10-CM

## 2019-04-05 DIAGNOSIS — I313 Pericardial effusion (noninflammatory): Secondary | ICD-10-CM | POA: Diagnosis not present

## 2019-04-05 DIAGNOSIS — Z7189 Other specified counseling: Secondary | ICD-10-CM

## 2019-04-05 NOTE — Patient Instructions (Signed)

## 2019-04-19 ENCOUNTER — Ambulatory Visit: Payer: BLUE CROSS/BLUE SHIELD

## 2019-04-20 ENCOUNTER — Telehealth: Payer: Self-pay | Admitting: Interventional Cardiology

## 2019-04-20 NOTE — Telephone Encounter (Signed)
Will route to Dr. Tamala Julian.  Return number for Dr. Manuella Ghazi is [phone].

## 2019-04-20 NOTE — Telephone Encounter (Signed)
   Stop potassium  Start amlodipine 5 mg daily  Stop colchicine  Place HCTZ on allergy list with reaction?  Rash  She will need a 1 month follow-up with me or a team member for repeat blood pressure check, to reassess whether any shortness of breath off of colchicine, and to establish a continuing pattern of follow-up at Dr. Trena Platt recommendation.  I still need to see her in 1 year as previously scheduled.  These are recommendations after discussing her case with rheumatologist, Dr. Manuella Ghazi.

## 2019-04-20 NOTE — Telephone Encounter (Signed)
Ms Amy Beard was calling to discuss some aspects of care with Dr. Tamala Julian. This patient is mutual, and Ms Amy Beard has spoken with Dr. Tamala Julian in regards to this past. It is not urgent, but she would like to speak with him soon.

## 2019-04-20 NOTE — Telephone Encounter (Signed)
Left message to call back.  I have added HCTZ to allergy list and d/c'ed K+ and Colchicine from medication list.

## 2019-04-20 NOTE — Telephone Encounter (Signed)
Left message letting pt know that Dr. Manuella Ghazi has reached out as well and as soon as Dr. Tamala Julian is able to speak with her and then sends recommendations, we will give her a call.  Advised to call back with any questions.

## 2019-04-20 NOTE — Telephone Encounter (Signed)
New Message:   Pt says she just left her Lupus doctor. She started her on a new medicine that will counteract with her medicine that Dr Tamala Julian have her on . She is on Mitiigare and Klor-Con. Please call and let  Her know what she needs to do about her medicine.

## 2019-04-21 NOTE — Telephone Encounter (Signed)
New Message ° ° ° °Patient returning your call please call patient back. °

## 2019-04-24 ENCOUNTER — Encounter (HOSPITAL_COMMUNITY): Payer: Self-pay | Admitting: Emergency Medicine

## 2019-04-24 ENCOUNTER — Emergency Department (HOSPITAL_COMMUNITY)
Admission: EM | Admit: 2019-04-24 | Discharge: 2019-04-24 | Disposition: A | Discharge status: Home / Self Care | Payer: BLUE CROSS/BLUE SHIELD | Attending: Emergency Medicine | Admitting: Emergency Medicine

## 2019-04-24 ENCOUNTER — Emergency Department (HOSPITAL_COMMUNITY): Payer: BLUE CROSS/BLUE SHIELD

## 2019-04-24 ENCOUNTER — Other Ambulatory Visit: Payer: Self-pay

## 2019-04-24 DIAGNOSIS — Z79899 Other long term (current) drug therapy: Secondary | ICD-10-CM | POA: Diagnosis not present

## 2019-04-24 DIAGNOSIS — R000 Tachycardia, unspecified: Secondary | ICD-10-CM | POA: Insufficient documentation

## 2019-04-24 DIAGNOSIS — R42 Dizziness and giddiness: Secondary | ICD-10-CM | POA: Insufficient documentation

## 2019-04-24 DIAGNOSIS — I10 Essential (primary) hypertension: Secondary | ICD-10-CM | POA: Diagnosis not present

## 2019-04-24 HISTORY — DX: Reserved for concepts with insufficient information to code with codable children: IMO0002

## 2019-04-24 HISTORY — DX: Systemic lupus erythematosus, unspecified: M32.9

## 2019-04-24 LAB — CBC
HCT: 44.4 % (ref 36.0–46.0)
Hemoglobin: 13.3 g/dL (ref 12.0–15.0)
MCH: 25.5 pg — ABNORMAL LOW (ref 26.0–34.0)
MCHC: 30 g/dL (ref 30.0–36.0)
MCV: 85.2 fL (ref 80.0–100.0)
Platelets: 309 10*3/uL (ref 150–400)
RBC: 5.21 MIL/uL — ABNORMAL HIGH (ref 3.87–5.11)
RDW: 14.2 % (ref 11.5–15.5)
WBC: 4.7 10*3/uL (ref 4.0–10.5)
nRBC: 0 % (ref 0.0–0.2)

## 2019-04-24 LAB — BASIC METABOLIC PANEL
Anion gap: 9 (ref 5–15)
BUN: 9 mg/dL (ref 8–23)
CO2: 24 mmol/L (ref 22–32)
Calcium: 9.6 mg/dL (ref 8.9–10.3)
Chloride: 108 mmol/L (ref 98–111)
Creatinine, Ser: 0.73 mg/dL (ref 0.44–1.00)
GFR calc Af Amer: >60 mL/min (ref >60)
GFR calc non Af Amer: >60 mL/min (ref >60)
Glucose, Bld: 94 mg/dL (ref 70–99)
Potassium: 3.9 mmol/L (ref 3.5–5.1)
Sodium: 141 mmol/L (ref 135–145)

## 2019-04-24 MED ORDER — SODIUM CHLORIDE 0.9% FLUSH: 3.0000 mL | Freq: Once | INTRAVENOUS | Status: DC

## 2019-04-24 MED ORDER — AMLODIPINE BESYLATE 5 MG PO TABS: 5.0000 mg | ORAL_TABLET | Freq: Every day | ORAL | 3 refills | Status: DC

## 2019-04-24 NOTE — ED Triage Notes (Addendum)
C/o feeling lightheaded, fast HR, and SOB since waking up at 4am.  Denies pain.  No arm drift or other neuro deficits noted on triage exam.  Pt states she just doesn't feel right.

## 2019-04-24 NOTE — Discharge Instructions (Signed)
Follow up with cardiology on this matter. Call to make an appointment.  Be sure to stay well hydrated by drinking plenty of water. Return to the ED for chest pain, shortness of breath, passing out, dizziness, abdominal pain, or any other major concerns.

## 2019-04-24 NOTE — ED Provider Notes (Signed)
  Physical Exam  BP (!) 147/87 (BP Location: Right Arm)   Pulse 76   Temp 97.6 F (36.4 C) (Oral)   Resp 18   SpO2 100%   ED Course/Procedures    Ultrasound ED Echo  Date/Time: 04/24/2019 9:04 AM Performed by: Sherwood Gambler, MD Authorized by: Sherwood Gambler, MD   Procedure details:    Indications comment:  Possible effusion, lightheaded   Views: subxiphoid and parasternal long axis view     Images: archived     Limitations:  Body habitus and acoustic shadowing Findings:    Pericardium: small pericardial effusion   Impression:    Impression: pericardial effusion present      MDM  Patient presents with lightheadedness and some palpitations earlier.  Had some extra supraventricular beats here but no obvious A. fib or significant arrhythmia.  Currently feeling fine which seems to correlate with her taking her metoprolol this morning.  She is known to have pericardial effusion and with widening of her cardiac silhouette, ultrasound performed by me which shows small pericardial effusion but essentially trivial and does not appear to be causing any significant cardiac dysfunction.  She otherwise feels well and appears stable for discharge home.  Follow-up with cardiology.       Sherwood Gambler, MD 04/24/19 [phone]

## 2019-04-24 NOTE — ED Notes (Signed)
ED Provider at bedside. 

## 2019-04-24 NOTE — ED Provider Notes (Signed)
[facility] EMERGENC[facility] Provider Note   CSN: [id number] Arrival date & time: 04/24/19  [id number]     History   Chief Complaint Chief Complaint  Patient presents with   Dizziness   Tachycardia    HPI Amy Beard is a 63 y.o. female.     HPI   Amy Beard is a 63 y.o. female, with a history of A. fib, HTN, lupus, presenting to the ED with lightheadedness beginning this morning.  States she awoke around 4 AM as she normally would.  As she was getting ready for the day, she began to feel lightheaded.  The sensation did not prevent her from continuing her morning routine.  She also experienced intermittent palpitations. She drank her morning shake and took her morning medications. Sensation persisted until she arrived in the ED where it resolved and has not recurred.  Started Azasan 75 mg on Saturday for her lupus.   Denies fever/chills, recent illness, neck/back pain, chest pain, shortness of breath, lower extremity edema or pain, orthopnea, N/V/D, abdominal pain, headache, neuro deficits, or any other complaints.   Past Medical History:  Diagnosis Date   GI bleeding 04/2018   Hypertension    Lupus [facility](HCC)     Patient Active Problem List   Diagnosis Date Noted   Pericardial effusion 09/28/2018   Hematochezia    Rectal bleeding 04/02/2018   Hypotension due to blood loss 04/02/2018   Rectal bleed 04/02/2018   Acute blood loss anemia 01/12/2018   Bleeding from varicose vein     Past Surgical History:  Procedure Laterality Date   COLONOSCOPY WITH PROPOFOL N/A 04/05/2018   Procedure: COLONOSCOPY WITH PROPOFOL;  Surgeon: Sherrilyn Ristanis, Henry L III, MD;  [facility];  Service: Gastroenterology;  Laterality: N/A;   ESOPHAGOGASTRODUODENOSCOPY (EGD) WITH PROPOFOL N/A 04/05/2018   Procedure: ESOPHAGOGASTRODUODENOSCOPY (EGD) WITH PROPOFOL;  Surgeon: Sherrilyn Ristanis, Henry L III, MD;  [facility];  Service: Gastroenterology;  Laterality:  N/A;   VARICOSE VEIN SURGERY       OB History   No obstetric history on file.      Home Medications    Prior to Admission medications   Medication Sig Start Date End Date Taking? Authorizing Provider  Azathioprine (AZASAN) 75 MG TABS Take 75 mg by mouth daily. 04/22/19  Yes [provider]  acetaminophen (TYLENOL) 500 MG tablet Take 1,500 mg by mouth daily as needed for mild pain, moderate pain, fever or headache.     [provider]  albuterol (PROVENTIL HFA;VENTOLIN HFA) 108 (90 Base) MCG/ACT inhaler Inhale 1-2 puffs into the lungs every 6 (six) hours as needed for wheezing or shortness of breath.    [provider]  BREO ELLIPTA 100-25 MCG/INH AEPB INHALE 1 PUFF INTO THE LUNGS DAILY FOR 30 DAYS. 03/17/19   [provider]  clobetasol ointment (TEMOVATE) 0.05 % Apply to affected area twice daily for up to 2 weeks. 09/27/18   [provider]  esomeprazole (NEXIUM) 20 MG capsule Take 20 mg by mouth daily.     [provider]  hydroxychloroquine (PLAQUENIL) 200 MG tablet Take 200 mg by mouth 2 (two) times daily.  01/23/19   [provider]  metoprolol tartrate (LOPRESSOR) 25 MG tablet TAKE 1 TABLET BY MOUTH TWICE A DAY 03/14/19   Fenton, Clint R, PA  OVER THE COUNTER MEDICATION Place 1 drop into both eyes daily as needed (dry eyes). Over the counter lubricating eye drop    [provider]  polyethylene glycol (MIRALAX / GLYCOLAX) packet Take 17 g by mouth daily as needed for mild constipation.     [provider]  vitamin B-12 (CYANOCOBALAMIN) 1000 MCG tablet Take 1,000 mcg by mouth daily.    [provider]  VITAMIN D, ERGOCALCIFEROL, PO Take 1,000 Units by mouth daily.     [provider]    Family History Family History  Problem Relation Age of Onset   Diabetes Mellitus II Mother    Diabetes Mellitus II Father     Social History Social History   Tobacco Use   Smoking status:  Never Smoker   Smokeless tobacco: Never Used  Substance Use Topics   Alcohol use: No   Drug use: No     Allergies   Hctz [hydrochlorothiazide]   Review of Systems Review of Systems  Constitutional: Negative for chills, diaphoresis and fever.  Respiratory: Negative for cough and shortness of breath.   Cardiovascular: Positive for palpitations (resolved). Negative for chest pain.  Gastrointestinal: Negative for abdominal pain, diarrhea, nausea and vomiting.  Musculoskeletal: Negative for back pain.  Neurological: Positive for light-headedness. Negative for syncope, weakness, numbness and headaches.  Psychiatric/Behavioral: Negative for confusion.  All other systems reviewed and are negative.    Physical Exam Updated Vital Signs BP 121/88 (BP Location: Right Arm)    Temp 97.6 F (36.4 C) (Oral)    Resp 18    SpO2 92%   Physical Exam Vitals signs and nursing note reviewed.  Constitutional:      General: She is not in acute distress.    Appearance: She is well-developed. She is not diaphoretic.  HENT:     Head: Normocephalic and atraumatic.     Mouth/Throat:     Mouth: Mucous membranes are moist.     Pharynx: Oropharynx is clear.  Eyes:     Conjunctiva/sclera: Conjunctivae normal.  Neck:     Musculoskeletal: Neck supple.  Cardiovascular:     Rate and Rhythm: Normal rate and regular rhythm.     Pulses: Normal pulses.          Radial pulses are 2+ on the right side and 2+ on the left side.       Posterior tibial pulses are 2+ on the right side and 2+ on the left side.     Heart sounds: Normal heart sounds.     Comments: Tactile temperature in the extremities appropriate and equal bilaterally. Pulmonary:     Effort: Pulmonary effort is normal. No respiratory distress.     Breath sounds: Normal breath sounds.  Abdominal:     Palpations: Abdomen is soft.     Tenderness: There is no abdominal tenderness. There is no guarding.  Musculoskeletal:     Right lower leg: No  edema.     Left lower leg: No edema.  Lymphadenopathy:     Cervical: No cervical adenopathy.  Skin:    General: Skin is warm and dry.  Neurological:     Mental Status: She is alert.     Comments: Sensation grossly intact to light touch in the extremities.  Grip strengths equal bilaterally.  Strength 5/5 in all extremities. No gait disturbance. Coordination intact. Cranial nerves III-XII grossly intact. No facial droop.   Psychiatric:        Mood and Affect: Mood and affect normal.        Speech: Speech normal.        Behavior: Behavior normal.      ED  Treatments / Results  Labs (all labs ordered are listed, but only abnormal results are displayed) Labs Reviewed  CBC - Abnormal; Notable for the following components:      Result Value   RBC 5.21 (*)    MCH 25.5 (*)    All other components within normal limits  BASIC METABOLIC PANEL    EKG EKG Interpretation  Date/Time:  Monday April 24 2019 08:06:30 EDT Ventricular Rate:  70 PR Interval:  132 QRS Duration: 90 QT Interval:  388 QTC Calculation: 419 R Axis:   54 Text Interpretation:  Sinus rhythm Baseline wander in lead(s) II aVF no acute ST/T changes Confirmed by Sherwood Gambler [phone]) on 04/24/2019 8:20:04 AM   Radiology Dg Chest 2 View  Result Date: 04/24/2019 CLINICAL DATA:  Shortness of breath EXAM: CHEST - 2 VIEW COMPARISON:  08/20/2018 FINDINGS: Cardiopericardial enlargement with pericardial effusion by June 2020 chest CT. Mediastinal width is more prominent but has the same shape and differences are likely from lower volumes. Low lung volumes with interstitial crowding. There is no edema, consolidation, effusion, or pneumothorax. IMPRESSION: 1. Cardiopericardial enlargement with pericardial effusion seen on a June 2020 chest CT. Mediastinal width is more prominent, question increasing pleural fluid. 2. No evidence of pneumonia or edema. Electronically Signed   By: Monte Fantasia M.D.   On: 04/24/2019 06:04     Procedures Procedures (including critical care time)  Medications Ordered in ED Medications  sodium chloride flush (NS) 0.9 % injection 3 mL (0 mLs Intravenous Hold 04/24/19 0807)     Initial Impression / Assessment and Plan / ED Course  I have reviewed the triage vital signs and the nursing notes.  Pertinent labs & imaging results that were available during my care of the patient were reviewed by me and considered in my medical decision making (see chart for details).        Patient presents with lightheadedness and intermittent palpitations.  These resolved prior to arrival. Patient is nontoxic appearing, afebrile, not tachycardic, not tachypneic, not hypotensive, maintains excellent SPO2 on room air, and is in no apparent distress.  Lab work and EKG reassuring.  Pericardial effusion noted on chest x-ray.  This has been previously noted on multiple occasions.  Bedside ultrasound shows no evidence of tamponade on and no significant change in pericardial effusion when compared to most recent echo. Cardiology follow-up. Strict return precautions discussed.  Patient voices understanding of these instructions, accepts the plan, and is comfortable with discharge.  Findings and plan of care discussed with Sherwood Gambler, MD. Dr. Regenia Skeeter personally evaluated and examined this patient.   Vitals:   04/24/19 0532 04/24/19 0800 04/24/19 0807 04/24/19 0900  BP: 121/88 120/72 (!) 147/87 111/84  Pulse:   76   Resp: 18 20 18 20   Temp: 97.6 F (36.4 C)     TempSrc: Oral     SpO2: 92% 100% 100%      Final Clinical Impressions(s) / ED Diagnoses   Final diagnoses:  [place]    ED Discharge Orders    None       Layla Maw 04/24/19 [id number]    Sherwood Gambler, MD 04/24/19 1645

## 2019-04-24 NOTE — ED Notes (Signed)
Pt states she feels fine, denies any dizziness, walked to restroom and back with steady gait.

## 2019-04-24 NOTE — Telephone Encounter (Signed)
Pt verbalized understanding of her med changes per Dr. Thompson Caul recommendations... she repeated back to me..   Pt declined an appt in one month with an APP.Marland Kitchen she requested that Anderson Malta calls when she returns to see if she can work her in anywhere mid to the end of October with Dr. Tamala Julian.   Pt will keep track of her BP and call if she has any problems.

## 2019-05-02 NOTE — Telephone Encounter (Signed)
Called pt and scheduled her to see Dr. Tamala Julian in October.

## 2019-06-01 ENCOUNTER — Ambulatory Visit: Payer: BLUE CROSS/BLUE SHIELD | Admitting: Interventional Cardiology

## 2019-07-25 NOTE — Progress Notes (Signed)
Cardiology Office Note:    Date:  07/26/2019   ID:  Amy Beard, DOB 07/04/1956, MRN [id number]  PCP:  Virgilio Belling, PA-C  Cardiologist:  Lesleigh Noe, MD   Referring MD: Chyrel Masson   Chief Complaint  Patient presents with  . Advice Only    Pericardial disease; lupus; interstitial lung disease    History of Present Illness:    Amy Beard is a 63 y.o. female with a hx of OSA, paroxysmal atrial fibrillation (1% burden),h/o GIB,  moderate pericardial effusion,SLE, on suppressive therapy, and essential hypertension.  Doing well.  No pleuritic chest pain.  Denies orthopnea and PND.  No change in current medical regimen.  Current medical regimen includes Plaquenil and azathioprine.  Appetite is been good.  Denies chills and fever.  Some tightness in her fingers.  I have reviewed records also within care everywhere from Dr. Sherryll Burger.  Concerns going forward are resolution of pericardial effusion and monitoring for the presence of pulmonary hypertension.  We have also switched the patient to amlodipine for blood pressure control and also to help combat Raynaud's.  She has tolerated that okay without significant lower extremity swelling.  Past Medical History:  Diagnosis Date  . GI bleeding 04/2018  . Hypertension   . Lupus [facility])     Past Surgical History:  Procedure Laterality Date  . COLONOSCOPY WITH PROPOFOL N/A 04/05/2018   Procedure: COLONOSCOPY WITH PROPOFOL;  Surgeon: Sherrilyn Rist, MD;  Location: [facility] ENDOSCOPY;  Service: Gastroenterology;  Laterality: N/A;  . ESOPHAGOGASTRODUODENOSCOPY (EGD) WITH PROPOFOL N/A 04/05/2018   Procedure: ESOPHAGOGASTRODUODENOSCOPY (EGD) WITH PROPOFOL;  Surgeon: Sherrilyn Rist, MD;  Location: [facility] ENDOSCOPY;  Service: Gastroenterology;  Laterality: N/A;  . VARICOSE VEIN SURGERY      Current Medications: Current Meds  Medication Sig  . acetaminophen (TYLENOL) 500 MG tablet Take 1,500 mg by mouth daily as needed  for mild pain, moderate pain, fever or headache.   . albuterol (PROVENTIL HFA;VENTOLIN HFA) 108 (90 Base) MCG/ACT inhaler Inhale 1-2 puffs into the lungs every 6 (six) hours as needed for wheezing or shortness of breath.  Marland Kitchen amLODipine (NORVASC) 5 MG tablet Take 1 tablet (5 mg total) by mouth daily.  . Azathioprine (AZASAN) 75 MG TABS Take 75 mg by mouth daily.  Marland Kitchen BREO ELLIPTA 100-25 MCG/INH AEPB INHALE 1 PUFF INTO THE LUNGS DAILY FOR 30 DAYS.  . clobetasol ointment (TEMOVATE) 0.05 % Apply to affected area twice daily for up to 2 weeks.  Marland Kitchen esomeprazole (NEXIUM) 20 MG capsule Take 20 mg by mouth daily.   . hydroxychloroquine (PLAQUENIL) 200 MG tablet Take 200 mg by mouth 2 (two) times daily.   . metoprolol tartrate (LOPRESSOR) 25 MG tablet TAKE 1 TABLET BY MOUTH TWICE A DAY  . OVER THE COUNTER MEDICATION Place 1 drop into both eyes daily as needed (dry eyes). Over the counter lubricating eye drop  . polyethylene glycol (MIRALAX / GLYCOLAX) packet Take 17 g by mouth daily as needed for mild constipation.   . vitamin B-12 (CYANOCOBALAMIN) 1000 MCG tablet Take 1,000 mcg by mouth daily.  Marland Kitchen VITAMIN D, ERGOCALCIFEROL, PO Take 1,000 Units by mouth daily.      Allergies:   Hctz [hydrochlorothiazide]   Social History   Socioeconomic History  . Marital status: Married    Spouse name: Not on file  . Number of children: Not on file  . Years of education: Not on file  . Highest  education level: Not on file  Occupational History  . Not on file  Tobacco Use  . Smoking status: Never Smoker  . Smokeless tobacco: Never Used  Substance and Sexual Activity  . Alcohol use: No  . Drug use: No  . Sexual activity: Not on file  Other Topics Concern  . Not on file  Social History Narrative  . Not on file   Social Determinants of Health   Financial Resource Strain:   . Difficulty of Paying Living Expenses: Not on file  Food Insecurity:   . Worried About Programme researcher, broadcasting/film/videounning Out of Food in the Last Year: Not on  file  . Ran Out of Food in the Last Year: Not on file  Transportation Needs:   . Lack of Transportation (Medical): Not on file  . Lack of Transportation (Non-Medical): Not on file  Physical Activity:   . Days of Exercise per Week: Not on file  . Minutes of Exercise per Session: Not on file  Stress:   . Feeling of Stress : Not on file  Social Connections:   . Frequency of Communication with Friends and Family: Not on file  . Frequency of Social Gatherings with Friends and Family: Not on file  . Attends Religious Services: Not on file  . Active Member of Clubs or Organizations: Not on file  . Attends BankerClub or Organization Meetings: Not on file  . Marital Status: Not on file     Family History: The patient's family history includes Diabetes Mellitus II in her father and mother.  ROS:   Please see the history of present illness.    Feels that the lupus is improving.  All other systems reviewed and are negative.  EKGs/Labs/Other Studies Reviewed:    The following studies were reviewed today: No new data  EKG:  EKG no new tracing  Recent Labs: 08/20/2018: ALT 13; Magnesium 2.0; TSH 1.635 04/24/2019: BUN 9; Creatinine, Ser 0.73; Hemoglobin 13.3; Platelets 309; Potassium 3.9; Sodium 141  Recent Lipid Panel No results found for: CHOL, TRIG, HDL, CHOLHDL, VLDL, LDLCALC, LDLDIRECT  Physical Exam:    VS:  BP 110/68   Pulse 73   Ht 5\' 7"  (1.702 m)   Wt 212 lb (96.2 kg)   SpO2 95%   BMI 33.20 kg/m     Wt Readings from Last 3 Encounters:  07/26/19 212 lb (96.2 kg)  04/05/19 207 lb 6.4 oz (94.1 kg)  01/12/19 204 lb 12.8 oz (92.9 kg)     GEN: Moderate obesity. No acute distress HEENT: Normal NECK: No JVD. LYMPHATICS: No lymphadenopathy CARDIAC:  RRR without murmur, gallop, or edema. VASCULAR:  Normal Pulses. No bruits. RESPIRATORY:  Clear to auscultation without rales, wheezing or rhonchi  ABDOMEN: Soft, non-tender, non-distended, No pulsatile mass, MUSCULOSKELETAL: No  deformity  SKIN: Warm and dry NEUROLOGIC:  Alert and oriented x 3 PSYCHIATRIC:  Normal affect   ASSESSMENT:    1. Pericardial effusion   2. Paroxysmal atrial fibrillation (HCC)   3. Essential hypertension   4. OSA (obstructive sleep apnea)   5. SLE (systemic lupus erythematosus related syndrome) (HCC)   6. Snoring   7. Educated about COVID-19 virus infection    PLAN:    In order of problems listed above:  1. No clinical evidence 2. In regular sinus rhythm currently 3. Excellent control 4. CPAP compliant 5. Being treated with both azathioprine and hydroxychloroquine.  She also has interstitial lung disease related to the lupus.  Dr. Sherryll BurgerShah is concerned about the  possibility of pulmonary hypertension.  We will plan to perform a 2D Doppler echocardiogram which she is seen in 6 months to follow-up. 6. 3W's discussed and being practiced to avoid Covid infection.   Clinical follow-up in 6 months.  May at some point next year repeat an echocardiogram to follow-up on pulmonary artery pressures and pericardial effusion.   Medication Adjustments/Labs and Tests Ordered: Current medicines are reviewed at length with the patient today.  Concerns regarding medicines are outlined above.  No orders of the defined types were placed in this encounter.  No orders of the defined types were placed in this encounter.   Patient Instructions  Medication Instructions:  Your physician recommends that you continue on your current medications as directed. Please refer to the Current Medication list given to you today.  *If you need a refill on your cardiac medications before your next appointment, please call your pharmacy*  Lab Work: None If you have labs (blood work) drawn today and your tests are completely normal, you will receive your results only by: Marland Kitchen MyChart Message (if you have MyChart) OR . A paper copy in the mail If you have any lab test that is abnormal or we need to change your  treatment, we will call you to review the results.  Testing/Procedures: None  Follow-Up: At [facility], you and your health needs are our priority.  As part of our continuing mission to provide you with exceptional heart care, we have created designated Provider Care Teams.  These Care Teams include your primary Cardiologist (physician) and Advanced Practice Providers (APPs -  Physician Assistants and Nurse Practitioners) who all work together to provide you with the care you need, when you need it.  Your next appointment:   6 month(s)  The format for your next appointment:   In Person  Provider:   You may see Sinclair Grooms, MD or one of the following Advanced Practice Providers on your designated Care Team:    Truitt Merle, NP  Cecilie Kicks, NP  Kathyrn Drown, NP   Other Instructions      Signed, Sinclair Grooms, MD  07/26/2019 2:25 PM    [place]

## 2019-07-26 ENCOUNTER — Encounter: Payer: Self-pay | Admitting: Interventional Cardiology

## 2019-07-26 ENCOUNTER — Other Ambulatory Visit: Payer: Self-pay

## 2019-07-26 ENCOUNTER — Ambulatory Visit: Payer: BLUE CROSS/BLUE SHIELD | Admitting: Interventional Cardiology

## 2019-07-26 ENCOUNTER — Encounter

## 2019-07-26 ENCOUNTER — Encounter (INDEPENDENT_AMBULATORY_CARE_PROVIDER_SITE_OTHER): Payer: Self-pay

## 2019-07-26 VITALS — BP 110/68 | HR 73 | Ht 67.0 in | Wt 212.0 lb

## 2019-07-26 DIAGNOSIS — I10 Essential (primary) hypertension: Secondary | ICD-10-CM | POA: Diagnosis not present

## 2019-07-26 DIAGNOSIS — I480 Paroxysmal atrial fibrillation: Secondary | ICD-10-CM

## 2019-07-26 DIAGNOSIS — I313 Pericardial effusion (noninflammatory): Secondary | ICD-10-CM | POA: Diagnosis not present

## 2019-07-26 DIAGNOSIS — R0683 Snoring: Secondary | ICD-10-CM

## 2019-07-26 DIAGNOSIS — G4733 Obstructive sleep apnea (adult) (pediatric): Secondary | ICD-10-CM | POA: Diagnosis not present

## 2019-07-26 DIAGNOSIS — Z7189 Other specified counseling: Secondary | ICD-10-CM

## 2019-07-26 DIAGNOSIS — M329 Systemic lupus erythematosus, unspecified: Secondary | ICD-10-CM

## 2019-07-26 DIAGNOSIS — I3139 Other pericardial effusion (noninflammatory): Secondary | ICD-10-CM

## 2019-07-26 NOTE — Patient Instructions (Signed)

## 2019-07-26 NOTE — Addendum Note (Signed)
Addended by: Loren Racer on: 07/26/2019 03:48 PM   Modules accepted: Orders

## 2019-08-07 ENCOUNTER — Other Ambulatory Visit: Payer: Self-pay

## 2019-08-07 ENCOUNTER — Ambulatory Visit: Payer: BLUE CROSS/BLUE SHIELD | Admitting: Interventional Cardiology

## 2019-08-07 ENCOUNTER — Encounter: Payer: Self-pay | Admitting: Interventional Cardiology

## 2019-08-07 ENCOUNTER — Telehealth: Payer: Self-pay | Admitting: *Deleted

## 2019-08-07 VITALS — BP 128/82 | HR 66 | Ht 67.0 in | Wt 212.6 lb

## 2019-08-07 DIAGNOSIS — I313 Pericardial effusion (noninflammatory): Secondary | ICD-10-CM

## 2019-08-07 DIAGNOSIS — G4733 Obstructive sleep apnea (adult) (pediatric): Secondary | ICD-10-CM | POA: Diagnosis not present

## 2019-08-07 DIAGNOSIS — I10 Essential (primary) hypertension: Secondary | ICD-10-CM

## 2019-08-07 DIAGNOSIS — I480 Paroxysmal atrial fibrillation: Secondary | ICD-10-CM | POA: Diagnosis not present

## 2019-08-07 DIAGNOSIS — M329 Systemic lupus erythematosus, unspecified: Secondary | ICD-10-CM

## 2019-08-07 DIAGNOSIS — I3139 Other pericardial effusion (noninflammatory): Secondary | ICD-10-CM

## 2019-08-07 DIAGNOSIS — Z7189 Other specified counseling: Secondary | ICD-10-CM

## 2019-08-07 NOTE — Progress Notes (Signed)
Cardiology Office Note:    Date:  08/07/2019   ID:  Amy Beard, DOB 1956-03-06, MRN [id number]  PCP:  Amy Belling, PA-C  Cardiologist:  Amy Noe, MD   Referring MD: Amy Beard   Chief Complaint  Patient presents with  . Advice Only    Pericardial disease  . Palpitations    AF    History of Present Illness:    Amy Beard is a 64 y.o. female with a hx of OSA,paroxysmal atrial fibrillation(1% burden), h/o GIB,moderate pericardial effusion,SLE, on suppressive therapy, and essential hypertension.  Amy Beard was seen earlier today by Amy Beard and was noted to have low oxygen saturation, fluctuating heart rate between 50 and 100 bpm, but had no specific complaints.  In speaking with the patient, she simply showed up for a routine follow-up.  After conversation with Amy Beard we invited the patient to come for an EKG and cardiology assessment.  Upon arriving, she had taken polish off her nails, had put all layers of clothing, and still had no specific cardiac complaints.  She specifically denies palpitations, chest pain, orthopnea, and edema.  Past Medical History:  Diagnosis Date  . GI bleeding 04/2018  . Hypertension   . Lupus Amy Beard)     Past Surgical History:  Procedure Laterality Date  . COLONOSCOPY WITH PROPOFOL N/A 04/05/2018   Procedure: COLONOSCOPY WITH PROPOFOL;  Surgeon: Amy Rist, MD;  Location: [facility] ENDOSCOPY;  Service: Gastroenterology;  Laterality: N/A;  . ESOPHAGOGASTRODUODENOSCOPY (EGD) WITH PROPOFOL N/A 04/05/2018   Procedure: ESOPHAGOGASTRODUODENOSCOPY (EGD) WITH PROPOFOL;  Surgeon: Amy Rist, MD;  Location: [facility] ENDOSCOPY;  Service: Gastroenterology;  Laterality: N/A;  . VARICOSE VEIN SURGERY      Current Medications: Current Meds  Medication Sig  . acetaminophen (TYLENOL) 500 MG tablet Take 1,500 mg by mouth daily as needed for mild pain, moderate pain, fever or headache.   . albuterol (PROVENTIL  HFA;VENTOLIN HFA) 108 (90 Base) MCG/ACT inhaler Inhale 1-2 puffs into the lungs every 6 (six) hours as needed for wheezing or shortness of breath.  Marland Kitchen amLODipine (NORVASC) 5 MG tablet Take 1 tablet (5 mg total) by mouth daily.  Marland Kitchen azaTHIOprine (IMURAN) 50 MG tablet Take 100 mg by mouth daily.  Marland Kitchen BREO ELLIPTA 100-25 MCG/INH AEPB INHALE 1 PUFF INTO THE LUNGS DAILY FOR 30 DAYS.  . clobetasol ointment (TEMOVATE) 0.05 % Apply to affected area twice daily for up to 2 weeks.  Marland Kitchen esomeprazole (NEXIUM) 20 MG capsule Take 20 mg by mouth daily.   . hydroxychloroquine (PLAQUENIL) 200 MG tablet Take 200 mg by mouth 2 (two) times daily.   . metoprolol tartrate (LOPRESSOR) 25 MG tablet TAKE 1 TABLET BY MOUTH TWICE A DAY  . OVER THE COUNTER MEDICATION Place 1 drop into both eyes daily as needed (dry eyes). Over the counter lubricating eye drop  . polyethylene glycol (MIRALAX / GLYCOLAX) packet Take 17 g by mouth daily as needed for mild constipation.   . vitamin B-12 (CYANOCOBALAMIN) 1000 MCG tablet Take 1,000 mcg by mouth daily.  Marland Kitchen VITAMIN D, ERGOCALCIFEROL, PO Take 1,000 Units by mouth daily.      Allergies:   Hctz [hydrochlorothiazide]   Social History   Socioeconomic History  . Marital status: Married    Spouse name: Not on file  . Number of children: Not on file  . Years of education: Not on file  . Highest education level: Not on file  Occupational  History  . Not on file  Tobacco Use  . Smoking status: Never Smoker  . Smokeless tobacco: Never Used  Substance and Sexual Activity  . Alcohol use: No  . Drug use: No  . Sexual activity: Not on file  Other Topics Concern  . Not on file  Social History Narrative  . Not on file   Social Determinants of Health   Financial Resource Strain:   . Difficulty of Paying Living Expenses: Not on file  Food Insecurity:   . Worried About Programme researcher, broadcasting/film/video in the Last Year: Not on file  . Ran Out of Food in the Last Year: Not on file  Transportation  Needs:   . Lack of Transportation (Medical): Not on file  . Lack of Transportation (Non-Medical): Not on file  Physical Activity:   . Days of Exercise per Week: Not on file  . Minutes of Exercise per Session: Not on file  Stress:   . Feeling of Stress : Not on file  Social Connections:   . Frequency of Communication with Friends and Family: Not on file  . Frequency of Social Gatherings with Friends and Family: Not on file  . Attends Religious Services: Not on file  . Active Member of Clubs or Organizations: Not on file  . Attends Banker Meetings: Not on file  . Marital Status: Not on file     Family History: The patient's family history includes Diabetes Mellitus II in her father and mother.  ROS:   Please see the history of present illness.    Has had some right lower extremity ankle swelling.  Otherwise no complaints.  All other systems reviewed and are negative.  EKGs/Labs/Other Studies Reviewed:    The following studies were reviewed today: Long-term Monitor 09/26/2018: Study Highlights  Sinus rhythm Average heart rate 79 bpm (range 40-150 bpm) Frequent premature atrial contractions (PACs) Rarely PACs occur in bigeminy and are not conducted Episodes of nonsustained atrial tachycardia are noted Short episodes of atrial fibrillation (45 seconds) with rapid ventricular rates are noted (overall burden <1%)       EKG:  EKG sinus rhythm, short PR interval, otherwise normal tracing which is unchanged when compared to prior April 24, 2019.Marland Kitchen  Recent Labs: 08/20/2018: ALT 13; Magnesium 2.0; TSH 1.635 04/24/2019: BUN 9; Creatinine, Ser 0.73; Hemoglobin 13.3; Platelets 309; Potassium 3.9; Sodium 141  Recent Lipid Panel No results found for: CHOL, TRIG, HDL, CHOLHDL, VLDL, LDLCALC, LDLDIRECT  Physical Exam:    VS:  BP 128/82   Pulse 66   Ht 5\' 7"  (1.702 m)   Wt 212 lb 9.6 oz (96.4 kg)   SpO2 98%   BMI 33.30 kg/m     Wt Readings from Last 3 Encounters:   08/07/19 212 lb 9.6 oz (96.4 kg)  07/26/19 212 lb (96.2 kg)  04/05/19 207 lb 6.4 oz (94.1 kg)     GEN: Moderate obesity. No acute distress HEENT: Normal NECK: No JVD. LYMPHATICS: No lymphadenopathy CARDIAC:  RRR without murmur, gallop, or edema. VASCULAR:  Normal Pulses. No bruits. RESPIRATORY:  Clear to auscultation without rales, wheezing or rhonchi  ABDOMEN: Soft, non-tender, non-distended, No pulsatile mass, MUSCULOSKELETAL: No deformity  SKIN: Warm and dry NEUROLOGIC:  Alert and oriented x 3 PSYCHIATRIC:  Normal affect   ASSESSMENT:    1. Paroxysmal atrial fibrillation (HCC)   2. Pericardial effusion   3. Essential hypertension   4. OSA (obstructive sleep apnea)   5. SLE (systemic lupus erythematosus  related syndrome) ([place])   6. Educated about COVID-19 virus infection    PLAN:    In order of problems listed above:  1. Suspect the patient may have been in atrial fibrillation.  Plan to repeat a 30-day monitor.  Repeat 2D Doppler echocardiogram to rule out recurrent pericardial effusion. 2. No clinical findings to suggest significant effusion as neck veins are flat.  Also do not hear a rub which would go along with acute inflammation. 3. Excellent blood pressure control 4. Despite having sleep apnea, she states that the company asked for the machine back and she is no longer on treatment.  This needs to be further investigated. 25. Being managed by Dr. Manuella Ghazi. 6. 3W' S is practiced to avoid COVID-19 infection.   Medication Adjustments/Labs and Tests Ordered: Current medicines are reviewed at length with the patient today.  Concerns regarding medicines are outlined above.  Orders Placed This Encounter  Procedures  . Cardiac event monitor  . EKG 12-Lead   No orders of the defined types were placed in this encounter.   Patient Instructions  Medication Instructions:  Your physician recommends that you continue on your current medications as directed. Please refer to  the Current Medication list given to you today.  *If you need a refill on your cardiac medications before your next appointment, please call your pharmacy*  Lab Work: None  If you have labs (blood work) drawn today and your tests are completely normal, you will receive your results only by: Marland Kitchen MyChart Message (if you have MyChart) OR . A paper copy in the mail If you have any lab test that is abnormal or we need to change your treatment, we will call you to review the results.  Testing/Procedures: Your physician has requested that you have an echocardiogram (please move current echo appt up to next available). Echocardiography is a painless test that uses sound waves to create images of your heart. It provides your doctor with information about the size and shape of your heart and how well your heart's chambers and valves are working. This procedure takes approximately one hour. There are no restrictions for this procedure.  Your physician has recommended that you wear an event monitor. Event monitors are medical devices that record the heart's electrical activity. Doctors most often [place] these monitors to diagnose arrhythmias. Arrhythmias are problems with the speed or rhythm of the heartbeat. The monitor is a small, portable device. You can wear one while you do your normal daily activities. This is usually used to diagnose what is causing palpitations/syncope (passing out).    Follow-Up: At [facility], you and your health needs are our priority.  As part of our continuing mission to provide you with exceptional heart care, we have created designated Provider Care Teams.  These Care Teams include your primary Cardiologist (physician) and Advanced Practice Providers (APPs -  Physician Assistants and Nurse Practitioners) who all work together to provide you with the care you need, when you need it.  Your next appointment:   Keep current plan for follow up in June  Other Instructions       Signed, Sinclair Grooms, MD  08/07/2019 1:23 PM    [place]

## 2019-08-07 NOTE — Patient Instructions (Signed)
Medication Instructions:  Your physician recommends that you continue on your current medications as directed. Please refer to the Current Medication list given to you today.  *If you need a refill on your cardiac medications before your next appointment, please call your pharmacy*  Lab Work: None  If you have labs (blood work) drawn today and your tests are completely normal, you will receive your results only by: Marland Kitchen MyChart Message (if you have MyChart) OR . A paper copy in the mail If you have any lab test that is abnormal or we need to change your treatment, we will call you to review the results.  Testing/Procedures: Your physician has requested that you have an echocardiogram (please move current echo appt up to next available). Echocardiography is a painless test that uses sound waves to create images of your heart. It provides your doctor with information about the size and shape of your heart and how well your heart's chambers and valves are working. This procedure takes approximately one hour. There are no restrictions for this procedure.  Your physician has recommended that you wear an event monitor. Event monitors are medical devices that record the heart's electrical activity. Doctors most often [place] these monitors to diagnose arrhythmias. Arrhythmias are problems with the speed or rhythm of the heartbeat. The monitor is a small, portable device. You can wear one while you do your normal daily activities. This is usually used to diagnose what is causing palpitations/syncope (passing out).    Follow-Up: At [facility], you and your health needs are our priority.  As part of our continuing mission to provide you with exceptional heart care, we have created designated Provider Care Teams.  These Care Teams include your primary Cardiologist (physician) and Advanced Practice Providers (APPs -  Physician Assistants and Nurse Practitioners) who all work together to provide you with the  care you need, when you need it.  Your next appointment:   Keep current plan for follow up in June  Other Instructions

## 2019-08-07 NOTE — Telephone Encounter (Signed)
[employer] to ship a 30 day cardiac event monitor to the patients home.  Instructions will be included in the monitor kit. 

## 2019-08-10 ENCOUNTER — Other Ambulatory Visit: Payer: Self-pay

## 2019-08-10 ENCOUNTER — Ambulatory Visit (HOSPITAL_COMMUNITY): Payer: BLUE CROSS/BLUE SHIELD | Attending: Cardiology

## 2019-08-10 DIAGNOSIS — I313 Pericardial effusion (noninflammatory): Secondary | ICD-10-CM | POA: Insufficient documentation

## 2019-08-10 DIAGNOSIS — I10 Essential (primary) hypertension: Secondary | ICD-10-CM | POA: Insufficient documentation

## 2019-08-10 DIAGNOSIS — I480 Paroxysmal atrial fibrillation: Secondary | ICD-10-CM | POA: Diagnosis present

## 2019-08-10 DIAGNOSIS — I3139 Other pericardial effusion (noninflammatory): Secondary | ICD-10-CM

## 2019-08-14 ENCOUNTER — Encounter: Payer: Self-pay | Admitting: Interventional Cardiology

## 2019-08-14 ENCOUNTER — Ambulatory Visit (INDEPENDENT_AMBULATORY_CARE_PROVIDER_SITE_OTHER): Payer: BLUE CROSS/BLUE SHIELD

## 2019-08-14 DIAGNOSIS — I480 Paroxysmal atrial fibrillation: Secondary | ICD-10-CM

## 2019-08-16 ENCOUNTER — Telehealth: Payer: Self-pay | Admitting: Medical

## 2019-08-16 DIAGNOSIS — I480 Paroxysmal atrial fibrillation: Secondary | ICD-10-CM

## 2019-08-16 DIAGNOSIS — I3139 Other pericardial effusion (noninflammatory): Secondary | ICD-10-CM

## 2019-08-16 DIAGNOSIS — I313 Pericardial effusion (noninflammatory): Secondary | ICD-10-CM

## 2019-08-16 MED ORDER — APIXABAN 5 MG PO TABS: 5.0000 mg | ORAL_TABLET | Freq: Two times a day (BID) | ORAL | 6 refills | Status: DC

## 2019-08-16 NOTE — Telephone Encounter (Signed)
   Notified by [employer] heart monitor that Ms. Wiebelhaus had an episode of atrial flutter with variable AV block with HR in the 140s starting at 6:38pm with return to NSR at 7:07pm. Patient was contacted and noted a racing heart beat sensation at that time but no other symptoms. Per chart review, patient has a history of atrial fibrillation, however incidence was noted to be <1%. Given reoccurrence and CHA2DS2-VASc Score and unadjusted Ischemic Stroke Rate (% per year) is equal to 2.2 % stroke rate/year from a score of 2 (HTN and Female), recommended starting apixaban 5mg  BID for stroke ppx. We discussed taking additional 25mg  of metoprolol if HR persistently elevated for >1 hour. Patient was in agreement with the plan and appreciative of the call.   Will route to Dr. as an .   Katrinka Blazing, PA-C 08/16/19; 7:25 PM

## 2019-08-17 NOTE — Telephone Encounter (Signed)
Order placed

## 2019-08-17 NOTE — Telephone Encounter (Signed)
I agree with the recommendations as noted which includes initiation of apixaban 5 mg twice daily, and as needed use of additional metoprolol.  She should complete her monitor to get some sense of burden of atrial flutter/fibrillation.  Initiation of anticoagulation therapy in her setting is potentially complicated by the mixed connective tissue disease diagnosis and the presence of chronic pericardial effusion which was mild to moderate by echo last week.  Inflammatory pericardial disease can be associated with pericardial hemorrhage and tamponade.  The current plan is to have an echocardiogram performed in a limited manner in 2 to 3 weeks to determine if the effusion is changing in size.  She should also be warned that if shortness of breath develops, she should contact us right away and take no more apixaban until we are able to investigate the shortness of breath.

## 2019-08-17 NOTE — Addendum Note (Signed)
Addended by: Julio Sicks on: 08/17/2019 11:32 AM   Modules accepted: Orders

## 2019-08-18 ENCOUNTER — Telehealth (HOSPITAL_COMMUNITY): Payer: Self-pay | Admitting: *Deleted

## 2019-08-18 NOTE — Telephone Encounter (Signed)
Called patient and cancelled echocardiogram scheduled for 08/22/19.  Should have been scheduled out two weeks from 08/17/19 per Dr. Katrinka Blazing.

## 2019-08-22 ENCOUNTER — Other Ambulatory Visit (HOSPITAL_COMMUNITY): Payer: BLUE CROSS/BLUE SHIELD

## 2019-08-30 ENCOUNTER — Telehealth: Payer: Self-pay | Admitting: *Deleted

## 2019-08-30 NOTE — Telephone Encounter (Signed)
Received [employer] cardiac report Day 16 - serious  Event: Recorded on 08/29/19 at 10:39 am CST Auto trigger event; no symptoms reported by patient. Report analysis: HR 230 bpm atrial fibrillation RVR sustained w/ run of v-tach (multiple)/couplet PVCs/PVCs (15 in 1 min)/artifact.  Unable to reach patient.  Left message on mobile VM to call back.  No answer, no VM at home. Echo scheduled 09/01/19. Reviewed w/ Dr. Delton See (DOD) who states this is afib RVR and to try to reach patient and ask about symptoms.

## 2019-09-01 ENCOUNTER — Ambulatory Visit (HOSPITAL_COMMUNITY): Payer: BLUE CROSS/BLUE SHIELD | Attending: Cardiology

## 2019-09-01 ENCOUNTER — Other Ambulatory Visit: Payer: Self-pay

## 2019-09-01 DIAGNOSIS — I313 Pericardial effusion (noninflammatory): Secondary | ICD-10-CM | POA: Diagnosis present

## 2019-09-01 DIAGNOSIS — I480 Paroxysmal atrial fibrillation: Secondary | ICD-10-CM | POA: Insufficient documentation

## 2019-09-01 DIAGNOSIS — I3139 Other pericardial effusion (noninflammatory): Secondary | ICD-10-CM

## 2019-09-01 NOTE — Telephone Encounter (Signed)
Called patient who states she is currently feeling well. She does recall the event that occurred on Tuesday and denies syncope, pre-syncope, dizziness or other concerns. States when she feels the irregular or fast heart rates she sits and rests. She denies complaints but states these phone calls are scaring her. I advised her of the need to monitor any symptoms of dizziness, lightheadedness or other concerns and to call us back if we call to report an abnormal monitor reading. I advised her not to drive or to pull over if symptoms occur and that Dr. Katrinka Blazing will review the monitor when she completes the monitoring period. She verbalized understanding and thanked me for the call.

## 2019-09-01 NOTE — Telephone Encounter (Signed)
No specific recommendations or changes in course.

## 2019-09-05 ENCOUNTER — Telehealth: Payer: Self-pay | Admitting: *Deleted

## 2019-09-05 DIAGNOSIS — I3139 Other pericardial effusion (noninflammatory): Secondary | ICD-10-CM

## 2019-09-05 DIAGNOSIS — I313 Pericardial effusion (noninflammatory): Secondary | ICD-10-CM

## 2019-09-05 NOTE — Telephone Encounter (Signed)
Informed pt of results. Pt verbalized understanding. 

## 2019-09-05 NOTE — Telephone Encounter (Signed)
-----   Message from Lyn Records, MD sent at 09/02/2019  6:50 PM EST ----- Let the patient know the fluid around heart is small to moderate in size and unchanged since starting anticoagulation. Thus, we are okay for now, but swelling and or shortness of breath should be reported immediately. Also needs limited echo 3-4 months to assess pericardial effusion stability A copy will be sent to Virgilio Belling, PA-C

## 2019-09-22 ENCOUNTER — Telehealth: Payer: Self-pay | Admitting: Interventional Cardiology

## 2019-09-22 NOTE — Telephone Encounter (Signed)
Spoke with pt and she denies any head trauma.  Advised her of recommendations.  Pt verbalized understanding and was appreciative for call.

## 2019-09-22 NOTE — Telephone Encounter (Signed)
Pt states for the last 3 days she has had a nagging HA and lightheadedness.  Tylenol eases the HA but doesn't resolve it.  BP this morning was 134/93.  Pt states this is about what it has been running the last 3 days but didn't have the other readings as she is at work.  States she hasn't been sleeping well either and is not sure if the lack of sleep is driving the rest of the symptoms.  The HA and lightheadedness is not terrible and pt is able to do her daily routine.  Pt was unsure if this was related to her lupus so, she wasn't sure which MD to reach out to first.  Advised I will send to Dr. Katrinka Blazing for review.

## 2019-09-22 NOTE — Telephone Encounter (Signed)
Pt c/o medication issue:  1. Name of Medication: METOPROLOL ? Per husband not sure which medication it is he call for wife.   2. How are you currently taking this medication (dosage and times per day)?  3. Are you having a reaction (difficulty breathing--STAT)? LIGHT HEADED , HEADACHES  4. What is your medication issue? LIGHT HEADED AND HEADACHES OFF AND ON.

## 2019-09-22 NOTE — Telephone Encounter (Signed)
As long as there are no neurological deficits, this seems unrelated to the heart. She is on anticoagulation, so want to be sure she has no head trauma that she forgotten.  She should mention to her Rheumatologist, Dr. Sherryll Burger, to see if Mixed Connective Tissue Disease related.  Also mention to Dr. Nehemiah Settle, as could be sinus or other issiue.  BP not high enough to cause HA.

## 2019-10-09 ENCOUNTER — Other Ambulatory Visit: Payer: Self-pay | Admitting: *Deleted

## 2019-10-10 ENCOUNTER — Telehealth: Payer: Self-pay | Admitting: *Deleted

## 2019-10-10 MED ORDER — METOPROLOL TARTRATE 25 MG PO TABS: 37.5000 mg | ORAL_TABLET | Freq: Two times a day (BID) | ORAL | 2 refills | Status: DC

## 2019-10-10 NOTE — Telephone Encounter (Signed)
-----   Message from Lyn Records, MD sent at 10/09/2019  1:42 PM EST ----- Let the patient know she shows evidence of atrial fibrillation around 1% of the time that the monitor was being used.  Heart rate gets extremely fast.  His metoprolol to 37.5 mg twice a day and measure blood pressures to make sure she is able to maintain above 100 mmHg systolic and heart rate greater than 40 bpm. A copy will be sent to Virgilio Belling, PA-C

## 2019-10-10 NOTE — Telephone Encounter (Signed)
Spoke with pt and went over results and recommendations per Dr. Smith.  Pt verbalized understanding and was in agreement with this plan.  

## 2019-10-11 ENCOUNTER — Telehealth: Payer: Self-pay | Admitting: Interventional Cardiology

## 2019-10-11 MED ORDER — METOPROLOL TARTRATE 25 MG PO TABS: 37.5000 mg | ORAL_TABLET | Freq: Two times a day (BID) | ORAL | 2 refills | Status: DC

## 2019-10-11 NOTE — Telephone Encounter (Signed)
Pt went to pharmacy to pick up metoprolol and they said they had not received the prescription.  Resent the prescription.  Pt states they did have HCTZ and potassium filled but she thought they were d/c'ed.  Advised pt those are no longer on her list and she shouldn't be taking them.  Advised I have sent over the new prescription.  Pt appreciative for assistance.

## 2019-10-11 NOTE — Telephone Encounter (Signed)
New message:    Patient calling concering her medication changes and would like for some one to call back, because the patient states she is already taking the same medication.

## 2019-10-17 IMAGING — DX DG CHEST 2V
2 series · 2 of 2 positions shown · non-contrast
Comparison: 08/20/2018

CLINICAL DATA: Shortness of breath

EXAM:
CHEST - 2 VIEW

[chest lat]
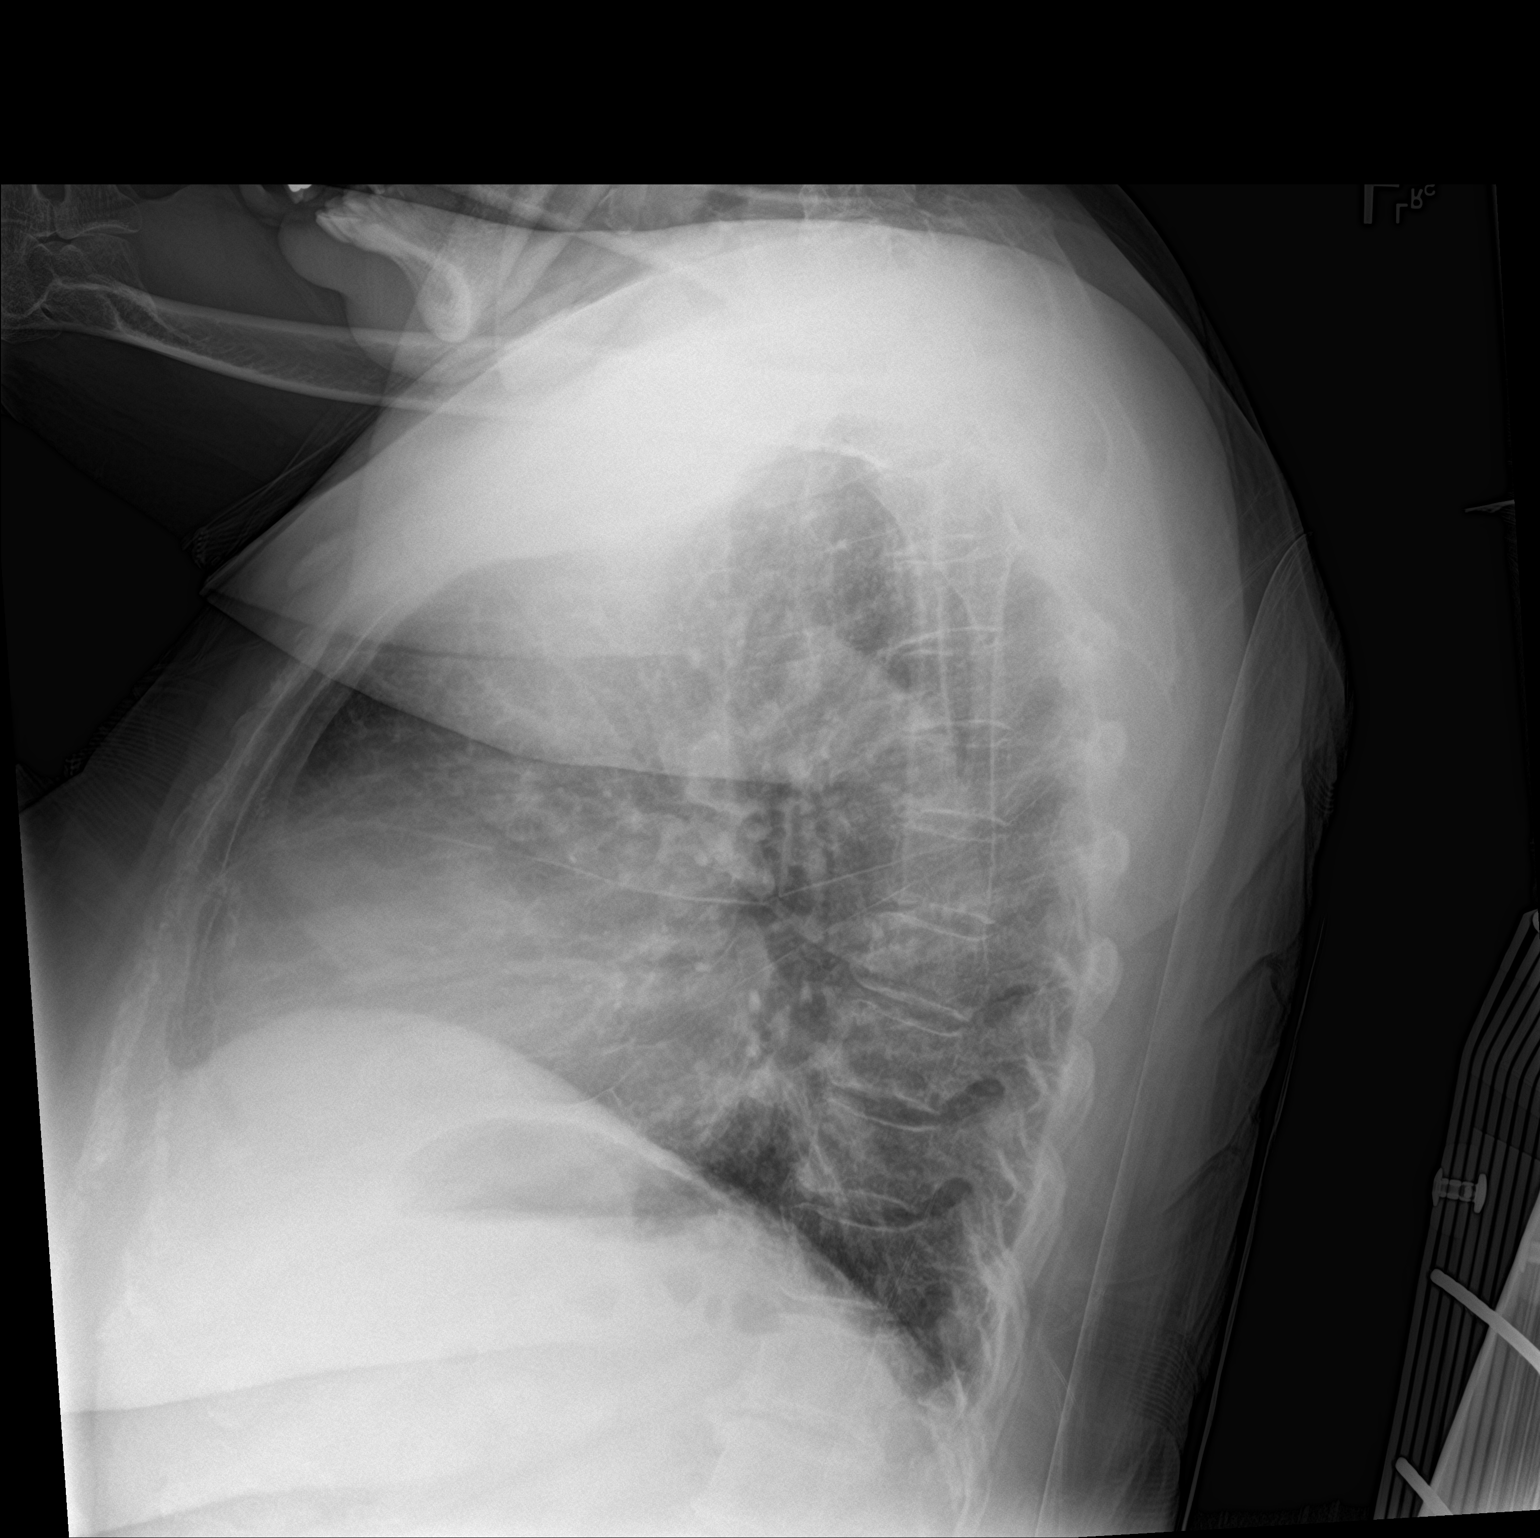

[chest ap]
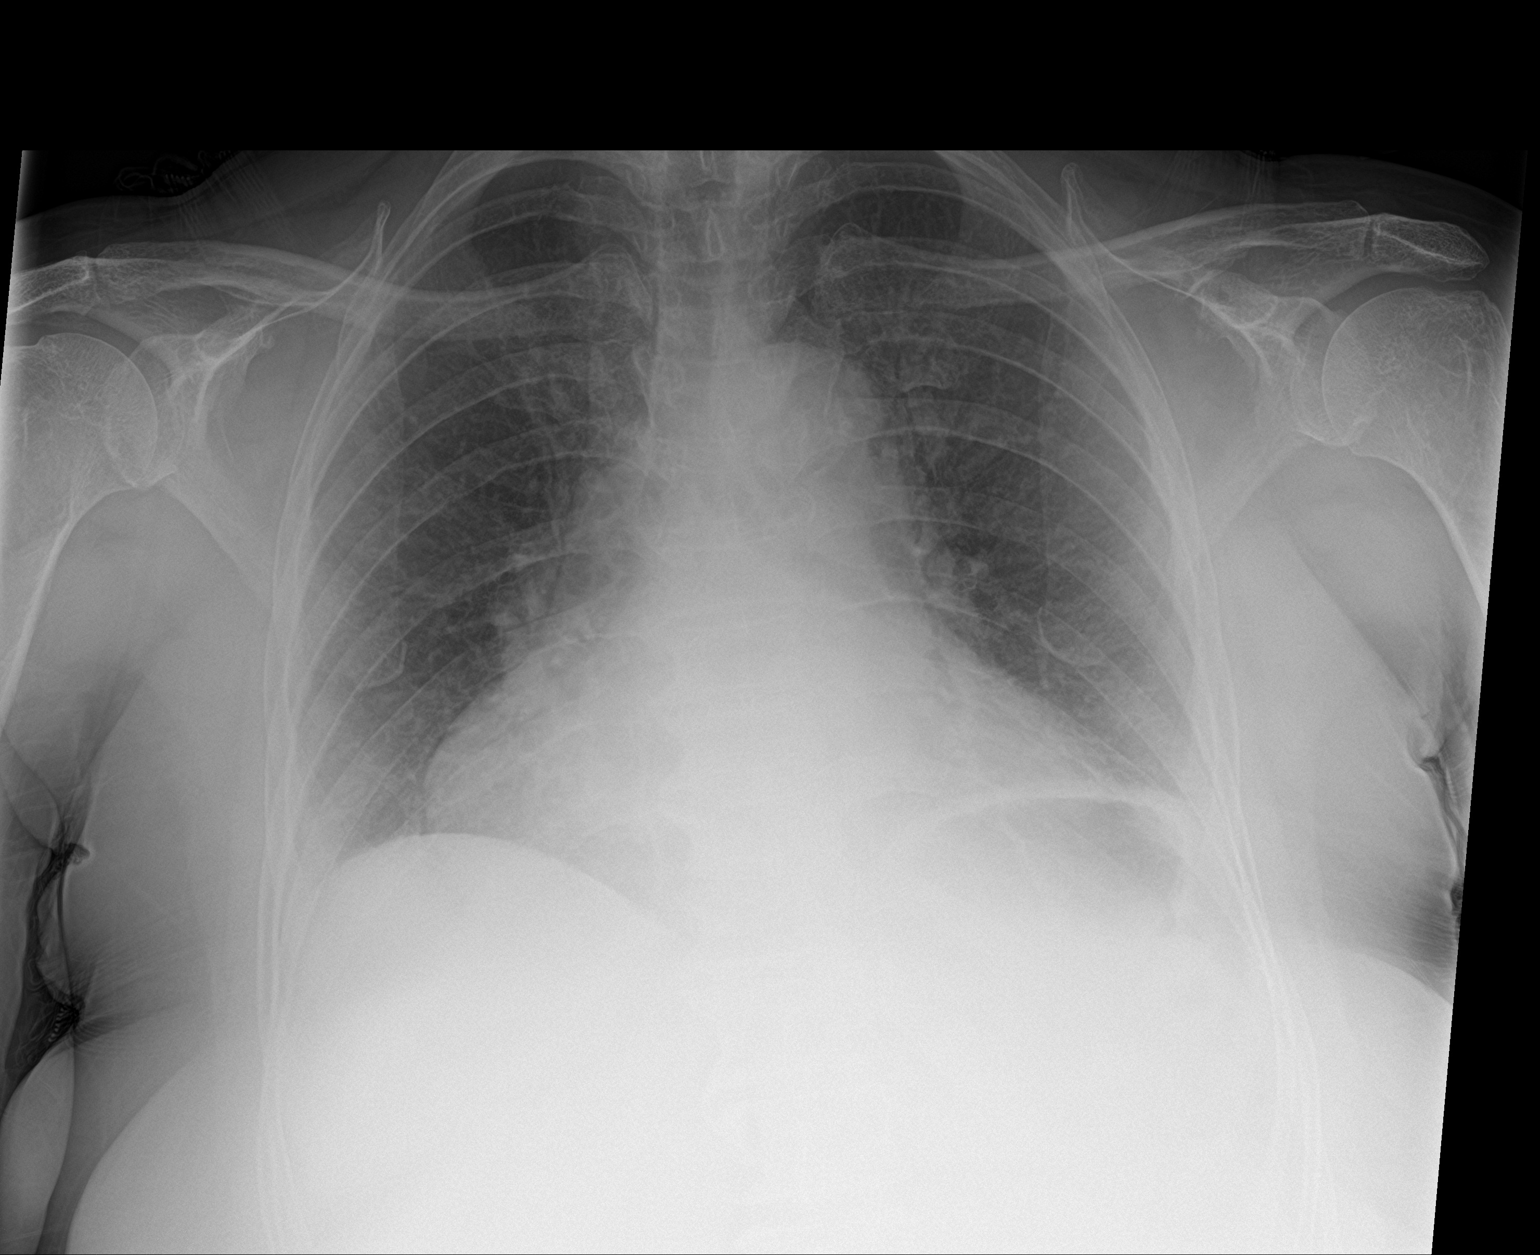

[2 of 2 positions shown; findings below may reference images not displayed]

FINDINGS: Cardiopericardial enlargement with pericardial effusion by January 2019
chest CT. Mediastinal width is more prominent but has the same shape
and differences are likely from lower volumes. Low lung volumes with
interstitial crowding. There is no edema, consolidation, effusion,
or pneumothorax.
IMPRESSION: 1. Cardiopericardial enlargement with pericardial effusion seen on a
January 2019 chest CT. Mediastinal width is more prominent, question
increasing pleural fluid.
2. No evidence of pneumonia or edema.

## 2019-11-20 ENCOUNTER — Other Ambulatory Visit (HOSPITAL_COMMUNITY): Payer: Self-pay | Admitting: Physician Assistant

## 2019-11-21 NOTE — Telephone Encounter (Signed)
Outpatient Medication Detail   Disp Refills Start End   metoprolol tartrate (LOPRESSOR) 25 MG tablet 270 tablet 2 10/11/2019    Sig - Route: Take 1.5 tablets (37.5 mg total) by mouth 2 (two) times daily. - Oral   Sent to pharmacy as: metoprolol tartrate (LOPRESSOR) 25 MG tablet   Notes to Pharmacy: Dose change   E-Prescribing Status: Receipt confirmed by pharmacy (10/11/2019 11:56 AM EST)   Pharmacy  [id number] [place], [place] - [address]

## 2019-12-11 ENCOUNTER — Ambulatory Visit (HOSPITAL_COMMUNITY): Payer: BLUE CROSS/BLUE SHIELD | Attending: Cardiology

## 2019-12-11 ENCOUNTER — Other Ambulatory Visit: Payer: Self-pay

## 2019-12-11 DIAGNOSIS — I313 Pericardial effusion (noninflammatory): Secondary | ICD-10-CM | POA: Diagnosis present

## 2019-12-11 DIAGNOSIS — I3139 Other pericardial effusion (noninflammatory): Secondary | ICD-10-CM

## 2020-01-18 NOTE — Progress Notes (Signed)
Cardiology Office Note:    Date:  01/22/2020   ID:  Amy Beard, DOB January 29, 1956, MRN [id number]  PCP:  Virgilio Belling, PA-C  Cardiologist:  Lesleigh Noe, MD   Referring MD: Chyrel Masson   Chief Complaint  Patient presents with  . Atrial Fibrillation  . Follow-up    Pericardial disease and lupus    History of Present Illness:    Amy Beard is a 64 y.o. female with a hx of OSA,paroxysmal atrial fibrillation(1% burden), h/o GIB,moderate pericardial effusion,SLE, on suppressive therapy, and essential hypertension.  Amy Beard feels well.  Therapy for lupus is changed.  She is now on CellCept 1000 mg twice daily.  She continues on Eliquis because of paroxysmal atrial fibrillation.  She denies orthopnea, PND, but does have right lower extremity edema.  She has had vein stripping in the right leg.  Past Medical History:  Diagnosis Date  . GI bleeding 04/2018  . Hypertension   . Lupus [facility])     Past Surgical History:  Procedure Laterality Date  . COLONOSCOPY WITH PROPOFOL N/A 04/05/2018   Procedure: COLONOSCOPY WITH PROPOFOL;  Surgeon: Sherrilyn Rist, MD;  Location: [facility] ENDOSCOPY;  Service: Gastroenterology;  Laterality: N/A;  . ESOPHAGOGASTRODUODENOSCOPY (EGD) WITH PROPOFOL N/A 04/05/2018   Procedure: ESOPHAGOGASTRODUODENOSCOPY (EGD) WITH PROPOFOL;  Surgeon: Sherrilyn Rist, MD;  Location: [facility] ENDOSCOPY;  Service: Gastroenterology;  Laterality: N/A;  . VARICOSE VEIN SURGERY      Current Medications: Current Meds  Medication Sig  . acetaminophen (TYLENOL) 500 MG tablet Take 1,500 mg by mouth daily as needed for mild pain, moderate pain, fever or headache.   . albuterol (PROVENTIL HFA;VENTOLIN HFA) 108 (90 Base) MCG/ACT inhaler Inhale 1-2 puffs into the lungs every 6 (six) hours as needed for wheezing or shortness of breath.  Marland Kitchen amLODipine (NORVASC) 5 MG tablet Take 1 tablet (5 mg total) by mouth daily.  Marland Kitchen apixaban (ELIQUIS) 5 MG TABS tablet Take 1  tablet (5 mg total) by mouth 2 (two) times daily.  Marland Kitchen azaTHIOprine (IMURAN) 50 MG tablet Take 100 mg by mouth daily.  Marland Kitchen BREO ELLIPTA 100-25 MCG/INH AEPB INHALE 1 PUFF INTO THE LUNGS DAILY FOR 30 DAYS.  . clobetasol ointment (TEMOVATE) 0.05 % Apply to affected area twice daily for up to 2 weeks.  Marland Kitchen esomeprazole (NEXIUM) 20 MG capsule Take 20 mg by mouth daily.   . hydroxychloroquine (PLAQUENIL) 200 MG tablet Take 200 mg by mouth 2 (two) times daily.   . metoprolol tartrate (LOPRESSOR) 25 MG tablet Take 1.5 tablets (37.5 mg total) by mouth 2 (two) times daily.  . mycophenolate (CELLCEPT) 500 MG tablet Take 1,000 mg by mouth 2 (two) times daily.  Marland Kitchen OVER THE COUNTER MEDICATION Place 1 drop into both eyes daily as needed (dry eyes). Over the counter lubricating eye drop  . polyethylene glycol (MIRALAX / GLYCOLAX) packet Take 17 g by mouth daily as needed for mild constipation.   . vitamin B-12 (CYANOCOBALAMIN) 1000 MCG tablet Take 1,000 mcg by mouth daily.  Marland Kitchen VITAMIN D, ERGOCALCIFEROL, PO Take 1,000 Units by mouth daily.      Allergies:   Hctz [hydrochlorothiazide]   Social History   Socioeconomic History  . Marital status: Married    Spouse name: Not on file  . Number of children: Not on file  . Years of education: Not on file  . Highest education level: Not on file  Occupational History  . Not on file  Tobacco Use  . Smoking status: Never Smoker  . Smokeless tobacco: Never Used  Vaping Use  . Vaping Use: Never used  Substance and Sexual Activity  . Alcohol use: No  . Drug use: No  . Sexual activity: Not on file  Other Topics Concern  . Not on file  Social History Narrative  . Not on file   Social Determinants of Health   Financial Resource Strain:   . Difficulty of Paying Living Expenses:   Food Insecurity:   . Worried About Programme researcher, broadcasting/film/video in the Last Year:   . Barista in the Last Year:   Transportation Needs:   . Freight forwarder (Medical):   Marland Kitchen Lack of  Transportation (Non-Medical):   Physical Activity:   . Days of Exercise per Week:   . Minutes of Exercise per Session:   Stress:   . Feeling of Stress :   Social Connections:   . Frequency of Communication with Friends and Family:   . Frequency of Social Gatherings with Friends and Family:   . Attends Religious Services:   . Active Member of Clubs or Organizations:   . Attends Banker Meetings:   Marland Kitchen Marital Status:      Family History: The patient's family history includes Diabetes Mellitus II in her father and mother.  ROS:   Please see the history of present illness.    Denies chills, fever, and joint pain.  All other systems reviewed and are negative.  EKGs/Labs/Other Studies Reviewed:    The following studies were reviewed today:   ECHOCARDIOGRAM 12/2019: IMPRESSIONS    1. Left ventricular ejection fraction, by estimation, is 60 to 65%. The  left ventricle has normal function. The left ventricle has no regional  wall motion abnormalities. Left ventricular diastolic parameters are  indeterminate. Elevated left ventricular  end-diastolic pressure.  2. Right ventricular systolic function is normal. The right ventricular  size is normal. There is mildly elevated pulmonary artery systolic  pressure.  3. Left atrial size was moderately dilated.  4. The mitral valve is normal in structure. Trivial mitral valve  regurgitation. No evidence of mitral stenosis.  5. The aortic valve is normal in structure. Aortic valve regurgitation is  not visualized. No aortic stenosis is present.  6. The inferior vena cava is normal in size with greater than 50%  respiratory variability, suggesting right atrial pressure of 3 mmHg.  7. There is a small circumferential pericardial effusion and is moderate  in size posteriorly measuring 1.7cm posteriorly. There is no evidence of  cardiac tamponade.   EKG:  EKG not repeated  Recent Labs: 04/24/2019: BUN 9; Creatinine, Ser  0.73; Hemoglobin 13.3; Platelets 309; Potassium 3.9; Sodium 141  Recent Lipid Panel No results found for: CHOL, TRIG, HDL, CHOLHDL, VLDL, LDLCALC, LDLDIRECT  Physical Exam:    VS:  BP 118/66   Pulse 80   Ht 5\' 7"  (1.702 m)   Wt 209 lb 1.9 oz (94.9 kg)   BMI 32.75 kg/m     Wt Readings from Last 3 Encounters:  01/22/20 209 lb 1.9 oz (94.9 kg)  08/07/19 212 lb 9.6 oz (96.4 kg)  07/26/19 212 lb (96.2 kg)     GEN: Mild obesity. No acute distress HEENT: Normal NECK: No JVD. LYMPHATICS: No lymphadenopathy CARDIAC:  RRR without murmur, gallop, or edema. VASCULAR:  Normal Pulses. No bruits. RESPIRATORY:  Clear to auscultation without rales, wheezing or rhonchi  ABDOMEN: Soft, non-tender, non-distended, No  pulsatile mass, MUSCULOSKELETAL: No deformity  SKIN: Warm and dry NEUROLOGIC:  Alert and oriented x 3 PSYCHIATRIC:  Normal affect   ASSESSMENT:    1. Paroxysmal atrial fibrillation (HCC)   2. SLE (systemic lupus erythematosus related syndrome) ([place])   3. Essential hypertension   4. OSA (obstructive sleep apnea)   5. Pericardial effusion   6. Educated about COVID-19 virus infection    PLAN:    In order of problems listed above:  1. Clinically in normal sinus rhythm today.  Continue apixaban for stroke prevention.  Chads Vascular score is greater than 2. 2. Lupus is being managed by Dr. Manuella Ghazi.  Tolerating therapy well. 3. Blood pressures under good control as noted above.  Low-salt diet discussed. 4. Encouraged compliance with CPAP. 5. Please see echo report above.  Pericardial effusion is still present but significantly improved without any evidence of right heart failure related to effusion. 6. Vaccine has been received.  Social distancing is being practiced.  Plan clinical follow-up in 6 to 8 months.  She should call if heart failure signs or symptoms.   Medication Adjustments/Labs and Tests Ordered: Current medicines are reviewed at length with the patient today.   Concerns regarding medicines are outlined above.  No orders of the defined types were placed in this encounter.  No orders of the defined types were placed in this encounter.   Patient Instructions  Medication Instructions:  Your physician recommends that you continue on your current medications as directed. Please refer to the Current Medication list given to you today.  *If you need a refill on your cardiac medications before your next appointment, please call your pharmacy*   Lab Work: None If you have labs (blood work) drawn today and your tests are completely normal, you will receive your results only by: Marland Kitchen MyChart Message (if you have MyChart) OR . A paper copy in the mail If you have any lab test that is abnormal or we need to change your treatment, we will call you to review the results.   Testing/Procedures: None   Follow-Up: At [facility], you and your health needs are our priority.  As part of our continuing mission to provide you with exceptional heart care, we have created designated Provider Care Teams.  These Care Teams include your primary Cardiologist (physician) and Advanced Practice Providers (APPs -  Physician Assistants and Nurse Practitioners) who all work together to provide you with the care you need, when you need it.  We recommend signing up for the patient portal called "MyChart".  Sign up information is provided on this After Visit Summary.  MyChart is used to connect with patients for Virtual Visits (Telemedicine).  Patients are able to view lab/test results, encounter notes, upcoming appointments, etc.  Non-urgent messages can be sent to your provider as well.   To learn more about what you can do with MyChart, go to [email or web address].    Your next appointment:   7-8 month(s)  The format for your next appointment:   In Person  Provider:   You may see Sinclair Grooms, MD or one of the following Advanced Practice Providers on your  designated Care Team:    Truitt Merle, NP  Cecilie Kicks, NP  Kathyrn Drown, NP    Other Instructions      Signed, Sinclair Grooms, MD  01/22/2020 8:48 AM    [place]

## 2020-01-22 ENCOUNTER — Ambulatory Visit: Payer: BLUE CROSS/BLUE SHIELD | Admitting: Interventional Cardiology

## 2020-01-22 ENCOUNTER — Encounter: Payer: Self-pay | Admitting: Interventional Cardiology

## 2020-01-22 ENCOUNTER — Other Ambulatory Visit: Payer: Self-pay

## 2020-01-22 VITALS — BP 118/66 | HR 80 | Ht 67.0 in | Wt 209.1 lb

## 2020-01-22 DIAGNOSIS — I10 Essential (primary) hypertension: Secondary | ICD-10-CM | POA: Diagnosis not present

## 2020-01-22 DIAGNOSIS — M329 Systemic lupus erythematosus, unspecified: Secondary | ICD-10-CM

## 2020-01-22 DIAGNOSIS — I480 Paroxysmal atrial fibrillation: Secondary | ICD-10-CM | POA: Diagnosis not present

## 2020-01-22 DIAGNOSIS — I3139 Other pericardial effusion (noninflammatory): Secondary | ICD-10-CM

## 2020-01-22 DIAGNOSIS — Z7189 Other specified counseling: Secondary | ICD-10-CM

## 2020-01-22 DIAGNOSIS — G4733 Obstructive sleep apnea (adult) (pediatric): Secondary | ICD-10-CM

## 2020-01-22 DIAGNOSIS — I313 Pericardial effusion (noninflammatory): Secondary | ICD-10-CM

## 2020-01-22 NOTE — Patient Instructions (Signed)
Medication Instructions:  Your physician recommends that you continue on your current medications as directed. Please refer to the Current Medication list given to you today.  *If you need a refill on your cardiac medications before your next appointment, please call your pharmacy*   Lab Work: None If you have labs (blood work) drawn today and your tests are completely normal, you will receive your results only by: Marland Kitchen MyChart Message (if you have MyChart) OR . A paper copy in the mail If you have any lab test that is abnormal or we need to change your treatment, we will call you to review the results.   Testing/Procedures: None   Follow-Up: At [facility], you and your health needs are our priority.  As part of our continuing mission to provide you with exceptional heart care, we have created designated Provider Care Teams.  These Care Teams include your primary Cardiologist (physician) and Advanced Practice Providers (APPs -  Physician Assistants and Nurse Practitioners) who all work together to provide you with the care you need, when you need it.  We recommend signing up for the patient portal called "MyChart".  Sign up information is provided on this After Visit Summary.  MyChart is used to connect with patients for Virtual Visits (Telemedicine).  Patients are able to view lab/test results, encounter notes, upcoming appointments, etc.  Non-urgent messages can be sent to your provider as well.   To learn more about what you can do with MyChart, go to [email or web address].    Your next appointment:   7-8 month(s)  The format for your next appointment:   In Person  Provider:   You may see Lesleigh Noe, MD or one of the following Advanced Practice Providers on your designated Care Team:    Norma Fredrickson, NP  Nada Boozer, NP  Georgie Chard, NP    Other Instructions

## 2020-03-05 ENCOUNTER — Other Ambulatory Visit: Payer: Self-pay | Admitting: Medical

## 2020-03-05 NOTE — Telephone Encounter (Signed)
Age 64, 95kg, scr 0.73 on 04/24/19, afib indication, last OV June 2021

## 2020-07-06 ENCOUNTER — Other Ambulatory Visit: Payer: Self-pay | Admitting: Interventional Cardiology

## 2020-08-08 ENCOUNTER — Telehealth: Payer: Self-pay | Admitting: Interventional Cardiology

## 2020-08-08 NOTE — Telephone Encounter (Signed)
**Note De-Identified Amy Beard Obfuscation** The pt has [employer] ins and can use a $10 Eliquis co-pay card. Will forward to pharmacy team for assistance.

## 2020-08-08 NOTE — Telephone Encounter (Signed)
New message:     Patient husband calling to see if they could get some help for Eliquis. Patient husband states it was about [phone] they need help

## 2020-08-08 NOTE — Telephone Encounter (Signed)
Called pharmacy, they stated pt already picked up Eliquis for $10 copay.

## 2020-08-08 NOTE — Telephone Encounter (Signed)
Hey Lynn, LPN, can you please advise on this matter? Thanks  ?

## 2020-08-10 ENCOUNTER — Other Ambulatory Visit: Payer: Self-pay | Admitting: Interventional Cardiology

## 2020-09-02 NOTE — Progress Notes (Signed)
Cardiology Office Note:    Date:  09/03/2020   ID:  Amy Beard, DOB [birth year], MRN [id number]  PCP:  Virgilio Belling, PA-C  Cardiologist:  Lesleigh Noe, MD   Referring MD: Chyrel Masson   Chief Complaint  Patient presents with  . Congestive Heart Failure  . Hospitalization Follow-up    Pericardial disease  . Atrial Fibrillation    History of Present Illness:    Amy Beard is a 65 y.o. female with a hx of OSA,paroxysmal atrial fibrillation(1% burden) likely secondary to pericardial disease, h/o GIB,moderate pericardial effusion,SLE, on suppressive therapy, and essential hypertension.  She denies dyspnea, chest discomfort, orthopnea, exertional fatigue, bleeding, and medication side effects.  She is able to lie flat.  She is active.  She is tolerating her therapies for lupus without difficulty.  She has had rare short instances of rapid heart rate causes her to feel fatigued.  She believes this is only happened once since I saw her last.  Past Medical History:  Diagnosis Date  . GI bleeding 04/2018  . Hypertension   . Lupus [facility])     Past Surgical History:  Procedure Laterality Date  . COLONOSCOPY WITH PROPOFOL N/A 04/05/2018   Procedure: COLONOSCOPY WITH PROPOFOL;  Surgeon: Sherrilyn Rist, MD;  Location: [facility] ENDOSCOPY;  Service: Gastroenterology;  Laterality: N/A;  . ESOPHAGOGASTRODUODENOSCOPY (EGD) WITH PROPOFOL N/A 04/05/2018   Procedure: ESOPHAGOGASTRODUODENOSCOPY (EGD) WITH PROPOFOL;  Surgeon: Sherrilyn Rist, MD;  Location: [facility] ENDOSCOPY;  Service: Gastroenterology;  Laterality: N/A;  . VARICOSE VEIN SURGERY      Current Medications: Current Meds  Medication Sig  . acetaminophen (TYLENOL) 500 MG tablet Take 1,500 mg by mouth daily as needed for mild pain, moderate pain, fever or headache.   . albuterol (PROVENTIL HFA;VENTOLIN HFA) 108 (90 Base) MCG/ACT inhaler Inhale 1-2 puffs into the lungs every 6 (six) hours as needed for  wheezing or shortness of breath.  Marland Kitchen amLODipine (NORVASC) 5 MG tablet TAKE 1 TABLET BY MOUTH EVERY DAY  . azaTHIOprine (IMURAN) 50 MG tablet Take 100 mg by mouth daily.  Marland Kitchen BREO ELLIPTA 100-25 MCG/INH AEPB INHALE 1 PUFF INTO THE LUNGS DAILY FOR 30 DAYS.  . clobetasol ointment (TEMOVATE) 0.05 % Apply to affected area twice daily for up to 2 weeks.  Marland Kitchen ELIQUIS 5 MG TABS tablet TAKE 1 TABLET BY MOUTH TWICE A DAY  . esomeprazole (NEXIUM) 20 MG capsule Take 20 mg by mouth daily.   . hydroxychloroquine (PLAQUENIL) 200 MG tablet Take 200 mg by mouth 2 (two) times daily.   . metoprolol tartrate (LOPRESSOR) 25 MG tablet TAKE 1 & 1/2 TABLETS (37.5 MG TOTAL) BY MOUTH 2 (TWO) TIMES DAILY.  . mycophenolate (CELLCEPT) 500 MG tablet Take 1,000 mg by mouth 2 (two) times daily.  Marland Kitchen OVER THE COUNTER MEDICATION Place 1 drop into both eyes daily as needed (dry eyes). Over the counter lubricating eye drop  . polyethylene glycol (MIRALAX / GLYCOLAX) packet Take 17 g by mouth daily as needed for mild constipation.   Marland Kitchen VITAMIN D, ERGOCALCIFEROL, PO Take 1,000 Units by mouth daily.      Allergies:   Hctz [hydrochlorothiazide]   Social History   Socioeconomic History  . Marital status: Married    Spouse name: Not on file  . Number of children: Not on file  . Years of education: Not on file  . Highest education level: Not on file  Occupational History  . Not  on file  Tobacco Use  . Smoking status: Never Smoker  . Smokeless tobacco: Never Used  Vaping Use  . Vaping Use: Never used  Substance and Sexual Activity  . Alcohol use: No  . Drug use: No  . Sexual activity: Not on file  Other Topics Concern  . Not on file  Social History Narrative  . Not on file   Social Determinants of Health   Financial Resource Strain: Not on file  Food Insecurity: Not on file  Transportation Needs: Not on file  Physical Activity: Not on file  Stress: Not on file  Social Connections: Not on file     Family History: The  patient's family history includes Diabetes Mellitus II in her father and mother.  ROS:   Please see the history of present illness.    She is on CellCept, hydroxychloroquine, Imuran, without side effects.  She has not noticed blood in her urine and stool on Eliquis.  All other systems reviewed and are negative.  EKGs/Labs/Other Studies Reviewed:    The following studies were reviewed today: No new data.  Last echo was May 2021.  This will be repeated around that time this year.  If effusion is stable or resolved we will follow clinically.  EKG:  EKG normal sinus rhythm, short PR interval, and appearance otherwise.  No significant change when compared to prior.  Recent Labs: No results found for requested labs within last 8760 hours.  Recent Lipid Panel No results found for: CHOL, TRIG, HDL, CHOLHDL, VLDL, LDLCALC, LDLDIRECT  Physical Exam:    VS:  BP 118/68   Pulse 72   Ht 5\' 5"  (1.651 m)   Wt 217 lb 9.6 oz (98.7 kg)   SpO2 100%   BMI 36.21 kg/m     Wt Readings from Last 3 Encounters:  09/03/20 217 lb 9.6 oz (98.7 kg)  01/22/20 209 lb 1.9 oz (94.9 kg)  08/07/19 212 lb 9.6 oz (96.4 kg)     GEN: Morbid obesity. No acute distress HEENT: Normal NECK: No JVD. LYMPHATICS: No lymphadenopathy CARDIAC: No murmur. RRR no gallop, or edema. VASCULAR:  Normal Pulses. No bruits. RESPIRATORY:  Clear to auscultation without rales, wheezing or rhonchi  ABDOMEN: Soft, non-tender, non-distended, No pulsatile mass, MUSCULOSKELETAL: No deformity  SKIN: Warm and dry NEUROLOGIC:  Alert and oriented x 3 PSYCHIATRIC:  Normal affect   ASSESSMENT:    1. Pericardial effusion   2. SLE (systemic lupus erythematosus related syndrome) (HCC)   3. Paroxysmal atrial fibrillation (HCC)   4. Essential hypertension   5. OSA (obstructive sleep apnea)   6. Educated about COVID-19 virus infection    PLAN:    In order of problems listed above:  1. 2D Doppler echocardiogram in May June July  timeframe.  Office visit shortly thereafter. 2. Continue therapy by Dr. 04-16-1990.  Imuran, CellCept, and hydroxychloroquine. 3. Rare instances.  Continue Eliquis.  Watch for bleeding. 4. Current excellent blood pressure and.  Low-salt diet recommended.  Continue amlodipine 5 mg daily 5. Encouraged CPAP compliance. 6. Vaccinated, boosted, and has been safe relative to omicron COVID-19  Overall education and awareness concerning primary/secondary risk prevention was discussed in detail: LDL less than 70, hemoglobin A1c less than 7, blood pressure target less than 130/80 mmHg, >150 minutes of moderate aerobic activity per week, avoidance of smoking, weight control (via diet and exercise), and continued surveillance/management of/for obstructive sleep apnea.    Medication Adjustments/Labs and Tests Ordered: Current medicines are reviewed at  length with the patient today.  Concerns regarding medicines are outlined above.  Orders Placed This Encounter  Procedures  . EKG 12-Lead   No orders of the defined types were placed in this encounter.   There are no Patient Instructions on file for this visit.   Signed, Lesleigh Noe, MD  09/03/2020 9:25 AM    [facility]

## 2020-09-03 ENCOUNTER — Ambulatory Visit: Payer: BLUE CROSS/BLUE SHIELD | Admitting: Interventional Cardiology

## 2020-09-03 ENCOUNTER — Other Ambulatory Visit: Payer: Self-pay

## 2020-09-03 ENCOUNTER — Encounter: Payer: Self-pay | Admitting: Interventional Cardiology

## 2020-09-03 VITALS — BP 118/68 | HR 72 | Ht 65.0 in | Wt 217.6 lb

## 2020-09-03 DIAGNOSIS — G4733 Obstructive sleep apnea (adult) (pediatric): Secondary | ICD-10-CM

## 2020-09-03 DIAGNOSIS — I3139 Other pericardial effusion (noninflammatory): Secondary | ICD-10-CM

## 2020-09-03 DIAGNOSIS — I313 Pericardial effusion (noninflammatory): Secondary | ICD-10-CM | POA: Diagnosis not present

## 2020-09-03 DIAGNOSIS — M329 Systemic lupus erythematosus, unspecified: Secondary | ICD-10-CM

## 2020-09-03 DIAGNOSIS — I480 Paroxysmal atrial fibrillation: Secondary | ICD-10-CM

## 2020-09-03 DIAGNOSIS — Z7189 Other specified counseling: Secondary | ICD-10-CM

## 2020-09-03 DIAGNOSIS — I10 Essential (primary) hypertension: Secondary | ICD-10-CM

## 2020-09-03 NOTE — Patient Instructions (Signed)
Medication Instructions:  Your physician recommends that you continue on your current medications as directed. Please refer to the Current Medication list given to you today.   *If you need a refill on your cardiac medications before your next appointment, please call your pharmacy*   Lab Work: BMET at time of echo  If you have labs (blood work) drawn today and your tests are completely normal, you will receive your results only by: Marland Kitchen MyChart Message (if you have MyChart) OR . A paper copy in the mail If you have any lab test that is abnormal or we need to change your treatment, we will call you to review the results.   Testing/Procedures: Your physician has requested that you have an echocardiogram in late May or June. Echocardiography is a painless test that uses sound waves to create images of your heart. It provides your doctor with information about the size and shape of your heart and how well your heart's chambers and valves are working. This procedure takes approximately one hour. There are no restrictions for this procedure.     Follow-Up: At [facility], you and your health needs are our priority.  As part of our continuing mission to provide you with exceptional heart care, we have created designated Provider Care Teams.  These Care Teams include your primary Cardiologist (physician) and Advanced Practice Providers (APPs -  Physician Assistants and Nurse Practitioners) who all work together to provide you with the care you need, when you need it.  We recommend signing up for the patient portal called "MyChart".  Sign up information is provided on this After Visit Summary.  MyChart is used to connect with patients for Virtual Visits (Telemedicine).  Patients are able to view lab/test results, encounter notes, upcoming appointments, etc.  Non-urgent messages can be sent to your provider as well.   To learn more about what you can do with MyChart, go to [email or web address].     Your next appointment:   6 month(s)  The format for your next appointment:   In Person  Provider:   You may see Lesleigh Noe, MD or one of the following Advanced Practice Providers on your designated Care Team:    Georgie Chard, NP    Other Instructions

## 2020-10-01 ENCOUNTER — Other Ambulatory Visit: Payer: Self-pay | Admitting: Interventional Cardiology

## 2020-10-01 NOTE — Telephone Encounter (Signed)
Pt last saw Dr Katrinka Blazing 09/03/20, last labs 09/10/20 Creat 0.65 at [facility] per care everywhere, age 65, weight 98.7kg, based on specified criteria pt is on appropriate dosage of Eliquis 5mg  BID for afib.  Will refill rx.

## 2020-12-02 ENCOUNTER — Other Ambulatory Visit (HOSPITAL_COMMUNITY): Payer: BLUE CROSS/BLUE SHIELD

## 2020-12-11 ENCOUNTER — Other Ambulatory Visit: Payer: BLUE CROSS/BLUE SHIELD

## 2020-12-11 ENCOUNTER — Other Ambulatory Visit (HOSPITAL_COMMUNITY): Payer: BLUE CROSS/BLUE SHIELD

## 2021-01-06 ENCOUNTER — Ambulatory Visit (HOSPITAL_COMMUNITY): Payer: BLUE CROSS/BLUE SHIELD | Attending: Internal Medicine

## 2021-01-06 ENCOUNTER — Other Ambulatory Visit: Payer: BLUE CROSS/BLUE SHIELD | Admitting: *Deleted

## 2021-01-06 ENCOUNTER — Other Ambulatory Visit: Payer: Self-pay

## 2021-01-06 DIAGNOSIS — I313 Pericardial effusion (noninflammatory): Secondary | ICD-10-CM

## 2021-01-06 DIAGNOSIS — I3139 Other pericardial effusion (noninflammatory): Secondary | ICD-10-CM

## 2021-01-06 DIAGNOSIS — I480 Paroxysmal atrial fibrillation: Secondary | ICD-10-CM

## 2021-01-06 DIAGNOSIS — I10 Essential (primary) hypertension: Secondary | ICD-10-CM

## 2021-01-06 LAB — ECHOCARDIOGRAM COMPLETE
Area-P 1/2: 4.31 cm2
S' Lateral: 2.7 cm

## 2021-01-06 LAB — BASIC METABOLIC PANEL
BUN/Creatinine Ratio: 14 (ref 12–28)
BUN: 9 mg/dL (ref 8–27)
CO2: 23 mmol/L (ref 20–29)
Calcium: 9.4 mg/dL (ref 8.7–10.3)
Chloride: 100 mmol/L (ref 96–106)
Creatinine, Ser: 0.63 mg/dL (ref 0.57–1.00)
Glucose: 80 mg/dL (ref 65–99)
Potassium: 4.2 mmol/L (ref 3.5–5.2)
Sodium: 139 mmol/L (ref 134–144)
eGFR: 100 mL/min/{1.73_m2} (ref >59)

## 2021-01-22 ENCOUNTER — Telehealth: Payer: Self-pay | Admitting: *Deleted

## 2021-01-22 DIAGNOSIS — M329 Systemic lupus erythematosus, unspecified: Secondary | ICD-10-CM

## 2021-01-22 DIAGNOSIS — I3139 Other pericardial effusion (noninflammatory): Secondary | ICD-10-CM

## 2021-01-22 DIAGNOSIS — I313 Pericardial effusion (noninflammatory): Secondary | ICD-10-CM

## 2021-01-22 MED ORDER — LORAZEPAM 0.5 MG PO TABS: ORAL_TABLET | ORAL | 0 refills | Status: DC

## 2021-01-22 NOTE — Telephone Encounter (Signed)
-----   Message from Lyn Records, MD sent at 01/20/2021  6:02 PM EDT ----- Regarding: Abnormal echo with persistent effusion Dr. Sherryll Burger, Rheumatology at [facility] is concerned about myocarditis related to Connective tissue diease/Lupus. She wants patient to have Cardiac MRI to exclude Myocarditis.

## 2021-01-22 NOTE — Telephone Encounter (Signed)
Patient notified of need for MRI.  She reports she had an MRI in the past and panicked. Reports feeling claustrophobic in elevators.  She has never taken medication prior to MRI as she has only had one time in the past. Will forward to Dr Katrinka Blazing to see if anything can be prescribed prior to MRI

## 2021-01-22 NOTE — Telephone Encounter (Signed)
Okay to use Lorazepam 0.5 mg one hour prior to procedure. Can repeat dose if not having relation after 30 minutes.

## 2021-01-22 NOTE — Telephone Encounter (Signed)
Patient notified. She would like to proceed with MRI.  Patient aware she will need to have someone drive her to and from MRI.  Prescription called to [facility] on [address]

## 2021-02-06 ENCOUNTER — Other Ambulatory Visit: Payer: Self-pay | Admitting: Interventional Cardiology

## 2021-02-28 ENCOUNTER — Telehealth (HOSPITAL_COMMUNITY): Payer: Self-pay | Admitting: Emergency Medicine

## 2021-02-28 NOTE — Telephone Encounter (Signed)
Reaching out to patient to offer assistance regarding upcoming cardiac imaging study; pt verbalizes understanding of appt date/time, parking situation and where to check in, and verified current allergies; name and call back number provided for further questions should they arise Rockwell Alexandria RN Navigator Cardiac Imaging [facility] [phone] office [phone] cell  Severe claustro - ativan 1 hr prior to scan Difficult IV  Denies implants

## 2021-03-04 ENCOUNTER — Encounter (HOSPITAL_COMMUNITY): Payer: Self-pay

## 2021-03-04 ENCOUNTER — Ambulatory Visit (HOSPITAL_COMMUNITY)
Admission: RE | Admit: 2021-03-04 | Discharge: 2021-03-04 | Disposition: A | Discharge status: Home / Self Care | Payer: BLUE CROSS/BLUE SHIELD | Source: Ambulatory Visit | Attending: Interventional Cardiology | Admitting: Interventional Cardiology

## 2021-03-04 ENCOUNTER — Other Ambulatory Visit: Payer: Self-pay

## 2021-03-04 DIAGNOSIS — M329 Systemic lupus erythematosus, unspecified: Secondary | ICD-10-CM | POA: Diagnosis present

## 2021-03-04 DIAGNOSIS — I313 Pericardial effusion (noninflammatory): Secondary | ICD-10-CM | POA: Insufficient documentation

## 2021-03-04 DIAGNOSIS — I3139 Other pericardial effusion (noninflammatory): Secondary | ICD-10-CM

## 2021-03-05 ENCOUNTER — Telehealth: Payer: Self-pay | Admitting: Interventional Cardiology

## 2021-03-05 MED ORDER — LORAZEPAM 1 MG PO TABS: ORAL_TABLET | ORAL | 0 refills | Status: DC

## 2021-03-05 NOTE — Telephone Encounter (Signed)
Message received from Radiology Dept that pt was unable to get MRI today due to claustrophobia.  Pt took Ativan 0.5mg  1 hour prior to MRI but it did not help.  Dr. Katrinka Blazing said ok to give Ativan 1mg  1 hour prior and can take an additional tablet 15-30 minutes later if no effect from first dose.  Spoke with pt and made her aware of this information.  She knows she will need to have someone drive her when she leaves.  Called prescription in to [facility]. Spoke with , PharmD.

## 2021-03-14 ENCOUNTER — Telehealth (HOSPITAL_COMMUNITY): Payer: Self-pay | Admitting: Emergency Medicine

## 2021-03-14 NOTE — Telephone Encounter (Signed)
Attempted to call patient regarding upcoming cardiac MR appointment. Left message on voicemail with name and callback number Tyliah Schlereth RN Navigator Cardiac Imaging [facility] [phone] Office [phone] Cell  

## 2021-03-16 NOTE — Progress Notes (Signed)
Cardiology Office Note:    Date:  03/20/2021   ID:  Amy Beard, DOB 12-07-1955, MRN [id number]  PCP:  Andria Frames, PA-C  Cardiologist:  Sinclair Grooms, MD   Referring MD: Andria Frames, PA-C  Rheum: Georga Bora, MD  Chief Complaint  Patient presents with   Atrial Fibrillation   Follow-up    Pericardial effusion Systemic lupus erythematosus    History of Present Illness:    Amy Beard is a 65 y.o. 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  [name] has no specific cardiac complaints.  She did undergo cardiac MRI this week.  Has some difficulty with procedure related to claustrophobia.  She is still working.  Doing as well as she was 6 months ago when I saw her.  She does have coughing at night when she lies down.  This is much better than when I met her 2 years ago.  She still has occasional lower extremity edema right greater than left.  She denies chest pain.  She does get tired during the day.  She runs a daycare center.  Fatigue is not associated with dyspnea.  She does not have orthopnea or chest pain.  We discussed in great detail the results of the cardiac MRI.  My basic message to her is that she continues to have a moderate to large pericardial effusion that is predominantly inferoposterior.  There is evidence of inflammation in the pericardium (mild) and also evidence of late gadolinium enhancement in the myocardium.  These findings suggest an inflammatory etiology of the pericardial effusion.  It would be highly unlikely that the pericardial effusion is related to malignancy over this chronic timeframe without development of worsening clinical syndrome.  Past Medical History:  Diagnosis Date   GI bleeding 04/2018   Hypertension    Lupus ([place])     Past Surgical History:  Procedure Laterality  Date   COLONOSCOPY WITH PROPOFOL N/A 04/05/2018   Procedure: COLONOSCOPY WITH PROPOFOL;  Surgeon: Doran Stabler, MD;  Location: [place];  Service: Gastroenterology;  Laterality: N/A;   ESOPHAGOGASTRODUODENOSCOPY (EGD) WITH PROPOFOL N/A 04/05/2018   Procedure: ESOPHAGOGASTRODUODENOSCOPY (EGD) WITH PROPOFOL;  Surgeon: Doran Stabler, MD;  Location: [place];  Service: Gastroenterology;  Laterality: N/A;   VARICOSE VEIN SURGERY      Current Medications: Current Meds  Medication Sig   acetaminophen (TYLENOL) 325 MG tablet Take 325 mg by mouth as needed.   acetaminophen (TYLENOL) 500 MG tablet Take 1,500 mg by mouth daily as needed for mild pain, moderate pain, fever or headache.    albuterol (PROVENTIL HFA;VENTOLIN HFA) 108 (90 Base) MCG/ACT inhaler Inhale 1-2 puffs into the lungs every 6 (six) hours as needed for wheezing or shortness of breath.   amLODipine (NORVASC) 5 MG tablet TAKE 1 TABLET BY MOUTH EVERY DAY   azaTHIOprine (IMURAN) 50 MG tablet Take 100 mg by mouth daily.   BREO ELLIPTA 100-25 MCG/INH AEPB INHALE 1 PUFF INTO THE LUNGS DAILY FOR 30 DAYS.   clobetasol ointment (TEMOVATE) 0.05 % Apply to affected area twice daily for up to 2 weeks.   ELIQUIS 5 MG TABS tablet TAKE 1 TABLET BY MOUTH TWICE A DAY   esomeprazole (NEXIUM) 20 MG capsule Take 20 mg by mouth daily.    hydroxychloroquine (PLAQUENIL) 200 MG tablet Take 200 mg by mouth  2 (two) times daily.    LORazepam (ATIVAN) 1 MG tablet Take one tablet by mouth 1 hour prior to test.  May take an additional tablet 15-30 minutes later if needed.   metoprolol tartrate (LOPRESSOR) 25 MG tablet TAKE 1 & 1/2 TABLETS (37.5 MG TOTAL) BY MOUTH 2 (TWO) TIMES DAILY.   mometasone (ELOCON) 0.1 % cream 1 application to affected area   mycophenolate (CELLCEPT) 500 MG tablet Take 1,000 mg by mouth 2 (two) times daily.   mycophenolate (CELLCEPT) 500 MG tablet Take 500 mg by mouth 2 (two) times daily.   nitrofurantoin,  macrocrystal-monohydrate, (MACROBID) 100 MG capsule Take 100 mg by mouth 2 (two) times daily.   OVER THE COUNTER MEDICATION Place 1 drop into both eyes daily as needed (dry eyes). Over the counter lubricating eye drop   polyethylene glycol (MIRALAX / GLYCOLAX) packet Take 17 g by mouth daily as needed for mild constipation.    VITAMIN D, ERGOCALCIFEROL, PO Take 1,000 Units by mouth daily.      Allergies:   Hctz [hydrochlorothiazide]   Social History   Socioeconomic History   Marital status: Married    Spouse name: Not on file   Number of children: Not on file   Years of education: Not on file   Highest education level: Not on file  Occupational History   Not on file  Tobacco Use   Smoking status: Never   Smokeless tobacco: Never  Vaping Use   Vaping Use: Never used  Substance and Sexual Activity   Alcohol use: No   Drug use: No   Sexual activity: Not on file  Other Topics Concern   Not on file  Social History Narrative   Not on file   Social Determinants of Health   Financial Resource Strain: Not on file  Food Insecurity: Not on file  Transportation Needs: Not on file  Physical Activity: Not on file  Stress: Not on file  Social Connections: Not on file     Family History: The patient's family history includes Diabetes Mellitus II in her father and mother.  ROS:   Please see the history of present illness.    She feels that her lupus is doing well.  There are no joint complaints.  She is concerned about her overall condition and her chronic medical problems.  We discussed in great detail the implications of the cardiac MRI.  All other systems reviewed and are negative.  EKGs/Labs/Other Studies Reviewed:     The following studies were reviewed today:  CARDIAC MRI 03/18/2021: IMPRESSION: Normal LV function. [place] Criteria for myocarditis not met.   Large pericardial effusion; presence of pericardial delayed enhancement is consistent with  inflammation and possible pericarditis.   If further imaging is required for pericardial signal, PET could be considered.  ECHOCARDIOGRAM 01/2021: IMPRESSIONS     1. Moderate pericardial effusion (small circumferential with largest,  posterior component ~ 1.7 cm) and with no evidence of increased  pericardial pressure.   2. Left ventricular ejection fraction, by estimation, is 60 to 65%. The  left ventricle has normal function. The left ventricle has no regional  wall motion abnormalities. Left ventricular diastolic parameters are  consistent with Grade II diastolic  dysfunction (pseudonormalization).   3. Right ventricular systolic function is normal. The right ventricular  size is normal. There is mildly elevated pulmonary artery systolic  pressure. The estimated right ventricular systolic pressure is 29.9 mmHg.   4. Left atrial size was moderately dilated.  5. The mitral valve is grossly normal. Mild mitral valve regurgitation.   6. Tricuspid valve regurgitation is mild to moderate.   7. The aortic valve is tricuspid. There is mild thickening of the aortic  valve. Aortic valve regurgitation is not visualized. No aortic stenosis is  present.   8. The inferior vena cava is normal in size with greater than 50%  respiratory variability, suggesting right atrial pressure of 3 mmHg.   EKG:  EKG is not performed.  Recent Labs: 01/06/2021: BUN 9; Creatinine, Ser 0.63; Potassium 4.2; Sodium 139  Recent Lipid Panel No results found for: CHOL, TRIG, HDL, CHOLHDL, VLDL, LDLCALC, LDLDIRECT  Physical Exam:    VS:  BP 118/70   Pulse 71   Ht 5' 5"  (1.651 m)   Wt 218 lb 3.2 oz (99 kg)   SpO2 97%   BMI 36.31 kg/m     Wt Readings from Last 3 Encounters:  03/20/21 218 lb 3.2 oz (99 kg)  09/03/20 217 lb 9.6 oz (98.7 kg)  01/22/20 209 lb 1.9 oz (94.9 kg)     GEN: Obese. No acute distress HEENT: Normal NECK: No JVD.  Minimal HJR. LYMPHATICS: No lymphadenopathy CARDIAC: No murmur.  RRR no gallop, with 1+ right ankle and trace left ankle edema. VASCULAR:  Normal Pulses. No bruits. RESPIRATORY:  Clear to auscultation without rales, wheezing or rhonchi  ABDOMEN: Soft, non-tender, non-distended, No pulsatile mass, MUSCULOSKELETAL: No deformity  SKIN: Warm and dry NEUROLOGIC:  Alert and oriented x 3 PSYCHIATRIC:  Normal affect   ASSESSMENT:    1. Pericardial effusion   2. SLE (systemic lupus erythematosus related syndrome) (HCC)   3. Paroxysmal atrial fibrillation ([place])   4. Essential hypertension   5. OSA (obstructive sleep apnea)    PLAN:    In order of problems listed above:  Persistent significant pericardial effusion with MRI findings suggesting an inflammatory etiology.  The effusion is now chronic and the patient has no evidence of tamponade or heart failure.  Perhaps the cough in the evenings and the peripheral edema are cardiogenic.  She is on a strong anti-inflammatory regimen for the lupus which is likely driving the effusion.  If intervention on the pericardial effusion is to be done, it should be a subxiphoid pericardial window to permanently resolve the issue. Per Dr. Manuella Ghazi.  Currently on 3 drug therapy according to the patient including CellCept, Imuran, and hydroxychloroquine.  I am not sure she is on all 3 meds. Excellent blood pressure control We did not discuss sleep apnea but this could be a source of fatigue.  This needs to be really addressed.   We discussed with Dr. Manuella Ghazi.  May need pericardial window.  There is no evidence of tamponade or cardiac embarrassment.   We will plan 47-monthfollow-up unless we decide a more aggressive approach concerning the pericardial effusion.  My recommendation would not be percutaneous approach but rather a subxiphoid pericardial window performed robotically.  I believe this would be a more enduring solution.   Medication Adjustments/Labs and Tests Ordered: Current medicines are reviewed at length with the  patient today.  Concerns regarding medicines are outlined above.  No orders of the defined types were placed in this encounter.  No orders of the defined types were placed in this encounter.   Patient Instructions  Medication Instructions:  Your physician recommends that you continue on your current medications as directed. Please refer to the Current Medication list given to you today.  *If you need  a refill on your cardiac medications before your next appointment, please call your pharmacy*   Lab Work: None If you have labs (blood work) drawn today and your tests are completely normal, you will receive your results only by: [place] (if you have MyChart) OR A paper copy in the mail If you have any lab test that is abnormal or we need to change your treatment, we will call you to review the results.   Testing/Procedures: None   Follow-Up: At [facility], you and your health needs are our priority.  As part of our continuing mission to provide you with exceptional heart care, we have created designated Provider Care Teams.  These Care Teams include your primary Cardiologist (physician) and Advanced Practice Providers (APPs -  Physician Assistants and Nurse Practitioners) who all work together to provide you with the care you need, when you need it.  We recommend signing up for the patient portal called "MyChart".  Sign up information is provided on this After Visit Summary.  MyChart is used to connect with patients for Virtual Visits (Telemedicine).  Patients are able to view lab/test results, encounter notes, upcoming appointments, etc.  Non-urgent messages can be sent to your provider as well.   To learn more about what you can do with MyChart, go to [email or web address].    Your next appointment:   6 month(s)  The format for your next appointment:   In Person  Provider:   You may see Sinclair Grooms, MD or one of the following Advanced Practice Providers on  your designated Care Team:   Cecilie Kicks, NP   Other Instructions     Signed, Sinclair Grooms, MD  03/20/2021 2:17 PM    [place]

## 2021-03-17 ENCOUNTER — Telehealth (HOSPITAL_COMMUNITY): Payer: Self-pay | Admitting: Emergency Medicine

## 2021-03-17 NOTE — Telephone Encounter (Signed)
Reaching out to patient to offer assistance regarding upcoming cardiac imaging study; pt verbalizes understanding of appt date/time, parking situation and where to check in, and verified current allergies; name and call back number provided for further questions should they arise Krystiana Fornes RN Navigator Cardiac Imaging [facility] [phone] office [phone] cell 

## 2021-03-18 ENCOUNTER — Ambulatory Visit (HOSPITAL_COMMUNITY)
Admission: RE | Admit: 2021-03-18 | Discharge: 2021-03-18 | Disposition: A | Discharge status: Home / Self Care | Payer: BLUE CROSS/BLUE SHIELD | Source: Ambulatory Visit | Attending: Interventional Cardiology | Admitting: Interventional Cardiology

## 2021-03-18 ENCOUNTER — Other Ambulatory Visit: Payer: Self-pay

## 2021-03-18 DIAGNOSIS — I313 Pericardial effusion (noninflammatory): Secondary | ICD-10-CM | POA: Diagnosis present

## 2021-03-18 DIAGNOSIS — M329 Systemic lupus erythematosus, unspecified: Secondary | ICD-10-CM | POA: Diagnosis present

## 2021-03-18 MED ORDER — GADOBUTROL 1 MMOL/ML IV SOLN
10.0000 mL | Freq: Once | INTRAVENOUS | Status: AC | PRN
  Administered 2021-03-18: 10 mL via INTRAVENOUS

## 2021-03-20 ENCOUNTER — Other Ambulatory Visit: Payer: Self-pay

## 2021-03-20 ENCOUNTER — Ambulatory Visit: Payer: BLUE CROSS/BLUE SHIELD | Admitting: Interventional Cardiology

## 2021-03-20 ENCOUNTER — Encounter: Payer: Self-pay | Admitting: Interventional Cardiology

## 2021-03-20 VITALS — BP 118/70 | HR 71 | Ht 65.0 in | Wt 218.2 lb

## 2021-03-20 DIAGNOSIS — I10 Essential (primary) hypertension: Secondary | ICD-10-CM | POA: Diagnosis not present

## 2021-03-20 DIAGNOSIS — I313 Pericardial effusion (noninflammatory): Secondary | ICD-10-CM

## 2021-03-20 DIAGNOSIS — M329 Systemic lupus erythematosus, unspecified: Secondary | ICD-10-CM

## 2021-03-20 DIAGNOSIS — G4733 Obstructive sleep apnea (adult) (pediatric): Secondary | ICD-10-CM

## 2021-03-20 DIAGNOSIS — I480 Paroxysmal atrial fibrillation: Secondary | ICD-10-CM

## 2021-03-20 DIAGNOSIS — I3139 Other pericardial effusion (noninflammatory): Secondary | ICD-10-CM

## 2021-03-20 NOTE — Patient Instructions (Signed)
Medication Instructions:  Your physician recommends that you continue on your current medications as directed. Please refer to the Current Medication list given to you today.  *If you need a refill on your cardiac medications before your next appointment, please call your pharmacy*   Lab Work: None If you have labs (blood work) drawn today and your tests are completely normal, you will receive your results only by: MyChart Message (if you have MyChart) OR A paper copy in the mail If you have any lab test that is abnormal or we need to change your treatment, we will call you to review the results.   Testing/Procedures: None   Follow-Up: At [facility], you and your health needs are our priority.  As part of our continuing mission to provide you with exceptional heart care, we have created designated Provider Care Teams.  These Care Teams include your primary Cardiologist (physician) and Advanced Practice Providers (APPs -  Physician Assistants and Nurse Practitioners) who all work together to provide you with the care you need, when you need it.  We recommend signing up for the patient portal called "MyChart".  Sign up information is provided on this After Visit Summary.  MyChart is used to connect with patients for Virtual Visits (Telemedicine).  Patients are able to view lab/test results, encounter notes, upcoming appointments, etc.  Non-urgent messages can be sent to your provider as well.   To learn more about what you can do with MyChart, go to [email or web address].    Your next appointment:   6 month(s)  The format for your next appointment:   In Person  Provider:   You may see Henry W Smith III, MD or one of the following Advanced Practice Providers on your designated Care Team:   Laura Ingold, NP   Other Instructions   

## 2021-05-03 ENCOUNTER — Other Ambulatory Visit: Payer: Self-pay | Admitting: Interventional Cardiology

## 2021-05-07 ENCOUNTER — Ambulatory Visit: Payer: BLUE CROSS/BLUE SHIELD | Admitting: Thoracic Surgery (Cardiothoracic Vascular Surgery)

## 2021-05-07 ENCOUNTER — Other Ambulatory Visit: Payer: Self-pay

## 2021-05-07 VITALS — BP 124/80 | HR 63 | Resp 20 | Ht 65.0 in

## 2021-05-07 DIAGNOSIS — I3139 Other pericardial effusion (noninflammatory): Secondary | ICD-10-CM

## 2021-05-07 NOTE — Progress Notes (Signed)
[address]       [place] [address]             [phone]        Amy Beard [facility] Medical Record #[id number] Date of Birth: Oct 26, 1955  Referring: Jonelle Sidle, MD Primary Care: Chyrel Masson Primary Cardiologist:Henry Jamey Reas, MD  Chief Complaint:    Chief Complaint  Patient presents with   Pericardial Effusion    Surgical consult, Cardiac MRI 03/18/21, ECHO 01/06/21    History of Present Illness:     65 year old female was referred by Dr.Smith for surgical assessment of a pericardial effusion.  She was recently diagnosed with lupus, and was noted to have exertional dyspnea and occasional palpitations.  She underwent an echocardiogram in June of this year which showed a mild to moderate sized pericardial effusion without any evidence of tamponade.  In September of this year she subsequently underwent a cardiac MRI which showed enlargement of the pericardial effusion.  There is also some evidence of pericarditis.  She continues to work and is not limited in her daily activities.  She does have some concerns about undergoing surgery.  She also is currently being treated for lupus, and is scheduled to change her regimen in the next few weeks.   Past Medical and Surgical History: Previous Chest Surgery: No Previous Chest Radiation: No Diabetes Mellitus: No   Past Medical History:  Diagnosis Date   GI bleeding 04/2018   Hypertension    Lupus (HCC)     Past Surgical History:  Procedure Laterality Date   COLONOSCOPY WITH PROPOFOL N/A 04/05/2018   Procedure: COLONOSCOPY WITH PROPOFOL;  Surgeon: Sherrilyn Rist, MD;  Location: [facility] ENDOSCOPY;  Service: Gastroenterology;  Laterality: N/A;   ESOPHAGOGASTRODUODENOSCOPY (EGD) WITH PROPOFOL N/A 04/05/2018   Procedure: ESOPHAGOGASTRODUODENOSCOPY (EGD) WITH PROPOFOL;  Surgeon: Sherrilyn Rist, MD;  Location: [facility] ENDOSCOPY;  Service: Gastroenterology;  Laterality: N/A;   VARICOSE VEIN  SURGERY      Social History: Support: Presents today with her husband.  Social History   Tobacco Use  Smoking Status Never  Smokeless Tobacco Never    Social History   Substance and Sexual Activity  Alcohol Use No     Allergies  Allergen Reactions   Hctz [Hydrochlorothiazide] Rash     Current Outpatient Medications  Medication Sig Dispense Refill   acetaminophen (TYLENOL) 325 MG tablet Take 325 mg by mouth as needed.     acetaminophen (TYLENOL) 500 MG tablet Take 1,500 mg by mouth daily as needed for mild pain, moderate pain, fever or headache.      amLODipine (NORVASC) 5 MG tablet TAKE 1 TABLET BY MOUTH EVERY DAY 90 tablet 3   BREO ELLIPTA 100-25 MCG/INH AEPB INHALE 1 PUFF INTO THE LUNGS DAILY FOR 30 DAYS.     ELIQUIS 5 MG TABS tablet TAKE 1 TABLET BY MOUTH TWICE A DAY 60 tablet 9   esomeprazole (NEXIUM) 20 MG capsule Take 20 mg by mouth daily.      hydroxychloroquine (PLAQUENIL) 200 MG tablet Take 200 mg by mouth 2 (two) times daily.      metoprolol tartrate (LOPRESSOR) 25 MG tablet TAKE 1 & 1/2 TABLETS (37.5 MG TOTAL) BY MOUTH 2 (TWO) TIMES DAILY. 270 tablet 3   mycophenolate (CELLCEPT) 500 MG tablet Take 500 mg by mouth 2 (two) times daily.     polyethylene glycol (MIRALAX / GLYCOLAX) packet Take 17 g by  mouth daily as needed for mild constipation.      VITAMIN D, ERGOCALCIFEROL, PO Take 1,000 Units by mouth daily.      albuterol (PROVENTIL HFA;VENTOLIN HFA) 108 (90 Base) MCG/ACT inhaler Inhale 1-2 puffs into the lungs every 6 (six) hours as needed for wheezing or shortness of breath. (Patient not taking: Reported on 05/07/2021)     azaTHIOprine (IMURAN) 50 MG tablet Take 100 mg by mouth daily. (Patient not taking: Reported on 05/07/2021)     clobetasol ointment (TEMOVATE) 0.05 % Apply to affected area twice daily for up to 2 weeks. (Patient not taking: Reported on 05/07/2021)     LORazepam (ATIVAN) 1 MG tablet Take one tablet by mouth 1 hour prior to test.  May take an  additional tablet 15-30 minutes later if needed. (Patient not taking: Reported on 05/07/2021) 2 tablet 0   mometasone (ELOCON) 0.1 % cream 1 application to affected area (Patient not taking: Reported on 05/07/2021)     nitrofurantoin, macrocrystal-monohydrate, (MACROBID) 100 MG capsule Take 100 mg by mouth 2 (two) times daily. (Patient not taking: Reported on 05/07/2021)     OVER THE COUNTER MEDICATION Place 1 drop into both eyes daily as needed (dry eyes). Over the counter lubricating eye drop (Patient not taking: Reported on 05/07/2021)     No current facility-administered medications for this visit.    (Not in a hospital admission)   Family History  Problem Relation Age of Onset   Diabetes Mellitus II Mother    Diabetes Mellitus II Father      Review of Systems:   Review of Systems  Constitutional:  Positive for malaise/fatigue.  Respiratory:  Positive for shortness of breath.   Cardiovascular:  Positive for palpitations. Negative for chest pain.     Physical Exam: BP 124/80 (BP Location: Right Arm, Patient Position: Sitting)   Pulse 63   Resp 20   Ht 5\' 5"  (1.651 m)   SpO2 98% Comment: RA  BMI 36.31 kg/m  Physical Exam Constitutional:      General: She is not in acute distress.    Appearance: Normal appearance. She is not ill-appearing, toxic-appearing or diaphoretic.  HENT:     Head: Normocephalic and atraumatic.  Eyes:     Extraocular Movements: Extraocular movements intact.  Cardiovascular:     Rate and Rhythm: Normal rate.  Pulmonary:     Effort: Pulmonary effort is normal. No respiratory distress.  Musculoskeletal:        General: Normal range of motion.     Cervical back: Normal range of motion.  Skin:    General: Skin is warm and dry.  Neurological:     General: No focal deficit present.     Mental Status: She is alert and oriented to person, place, and time.      Diagnostic Studies & Laboratory data:     I have independently reviewed the above  radiologic studies and discussed with the patient   Recent Lab Findings: Lab Results  Component Value Date   WBC 4.7 04/24/2019   HGB 13.3 04/24/2019   HCT 44.4 04/24/2019   PLT 309 04/24/2019   GLUCOSE 80 01/06/2021   ALT 13 08/20/2018   AST 22 08/20/2018   NA 139 01/06/2021   K 4.2 01/06/2021   CL 100 01/06/2021   CREATININE 0.63 01/06/2021   BUN 9 01/06/2021   CO2 23 01/06/2021   TSH 1.635 08/20/2018   INR 1.16 04/02/2018      Assessment / Plan:  65 year old female who presents with a pericardial effusion.  The cardiac MRI shows that there has been some growth as well as inflammation to the pericardium.  She does have a new diagnosis of lupus and is possible that this is related to it.  Cardiology is recommended that she undergo a pericardial window with biopsy of the pericardium to rule out any malignancy.  We discussed the risks and benefits of a robotic assisted pericardial window approach from the right side.  The patient would like some time to consider this.  She will call us back to let her know her ultimate decision.     I  spent 40 minutes counseling the patient face to face.   Corliss Skains 05/07/2021 11:39 AM

## 2021-05-08 ENCOUNTER — Other Ambulatory Visit: Payer: Self-pay | Admitting: *Deleted

## 2021-05-08 DIAGNOSIS — I3139 Other pericardial effusion (noninflammatory): Secondary | ICD-10-CM

## 2021-05-09 ENCOUNTER — Encounter: Payer: Self-pay | Admitting: *Deleted

## 2021-05-12 ENCOUNTER — Other Ambulatory Visit: Payer: Self-pay | Admitting: *Deleted

## 2021-05-12 DIAGNOSIS — I3139 Other pericardial effusion (noninflammatory): Secondary | ICD-10-CM

## 2021-05-13 ENCOUNTER — Telehealth: Payer: Self-pay | Admitting: Interventional Cardiology

## 2021-05-13 DIAGNOSIS — I3139 Other pericardial effusion (noninflammatory): Secondary | ICD-10-CM

## 2021-05-13 NOTE — Telephone Encounter (Signed)
Spoke with pt and made her aware of referral.  Pt appreciative for call.

## 2021-05-13 NOTE — Telephone Encounter (Signed)
Message received from Dr. Katrinka Blazing to refer pt to Dr. Duanne Limerick at [facility] due to Dr. Cliffton Asters being out of network with her insurance.  Surgery coordinator at [facility] cancelled procedure on Monday.  Will place referral and call pt to make her aware.

## 2021-05-15 ENCOUNTER — Other Ambulatory Visit (HOSPITAL_COMMUNITY): Payer: BLUE CROSS/BLUE SHIELD

## 2021-05-19 ENCOUNTER — Encounter (HOSPITAL_COMMUNITY): Admission: RE | Payer: Self-pay | Source: Ambulatory Visit

## 2021-05-19 ENCOUNTER — Inpatient Hospital Stay (HOSPITAL_COMMUNITY)
Admission: RE | Admit: 2021-05-19 | Payer: BLUE CROSS/BLUE SHIELD | Source: Ambulatory Visit | Admitting: Thoracic Surgery (Cardiothoracic Vascular Surgery)

## 2021-05-19 SURGERY — CREATION, PERICARDIAL WINDOW, ROBOT-ASSISTED
Anesthesia: General | Laterality: Right

## 2021-06-24 ENCOUNTER — Other Ambulatory Visit: Payer: Self-pay | Admitting: Interventional Cardiology

## 2021-07-02 ENCOUNTER — Telehealth: Payer: Self-pay | Admitting: Interventional Cardiology

## 2021-07-02 NOTE — Telephone Encounter (Signed)
Spoke with Dr. Katrinka Blazing prior to calling pt and reviewed information in Care Everywhere from pt's recent hospitalization for Pericardial Window and AF.  He said if pt taking Amiodarone 200mg  QD, have her increase to BID and come in Monday.  Pt states they tried to cardiovert her twice and it did not take so they started her on Amiodarone.  Took 400mg  QAM/200mg  QPM x 1 week and then went to just 200mg  QD.  Feels very tired and fatigued.  Advised pt of recommendations to increase Amiodarone to 200mg  BID and scheduled her to come in Monday.  Pt verbalized understanding and was in agreement with plan.

## 2021-07-02 NOTE — Telephone Encounter (Signed)
Patient called stating she is currently in A-Fib. She states after surgery her heart was out of rhythm they tried to put her in rhythm 2 times but couldn't.  They put her on medication.  She would like to come in and see him.  I did not find anything until April, she would like to see him sooner than April.

## 2021-07-06 NOTE — H&P (View-Only) (Signed)
Cardiology Office Note:    Date:  07/07/2021   ID:  Amy Beard, DOB 14-Feb-1956, MRN [id number]  PCP:  Andria Frames, PA-C  Cardiologist:  Sinclair Grooms, MD   Referring MD: Andria Frames, PA-C   Chief Complaint  Patient presents with   Atrial Fibrillation   Follow-up    Postoperative atrial flutter following pericardial window    History of Present Illness:    Amy Beard is a 65 y.o. 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 Subxyphoid pericardial window Dr. Clementeen Graham, [facility] 06/05/2021. Post op AF with failed electrical CV x2 06/10/2021.  Discharged 06/12/2021.from hospital on metoprolol and amiodarone with plan for Cv in 2-3 weeks. Patient never started Amiodarone.  Pericardial biopsy without inflammation, negative gram stain, cell count43% lymphocytes and 16 % monocytes.  She feels terrible.  She had lightens me today that she was on 200 mg tablets 2 tablets twice a day from discharged on 06/12/2021 for 1 week and then decrease to 200 mg daily until we called her last Monday and asked her to increase the amiodarone back up to 200 mg twice daily which she continues to take.  She has no exertional tolerance.  She is accompanied by her husband today.  She is very depressed concerning her overall condition.  She is trying to work despite being in atrial flutter which prevents significant physical activity.  She does not appear to be in heart failure.  Her O2 saturation is normal.  She will see Dr. Clementeen Graham tomorrow for postop follow-up.    She is not having nausea or difficulty with amiodarone tablets.  She denies orthopnea.  She is not having lower extremity swelling.  Anticoagulation was started prior to discharge on 06/10/2021.  She has had uninterrupted anticoagulation since that time.  Past Medical History:   Diagnosis Date   GI bleeding 04/2018   Hypertension    Lupus ([place])     Past Surgical History:  Procedure Laterality Date   COLONOSCOPY WITH PROPOFOL N/A 04/05/2018   Procedure: COLONOSCOPY WITH PROPOFOL;  Surgeon: Doran Stabler, MD;  Location: [place];  Service: Gastroenterology;  Laterality: N/A;   ESOPHAGOGASTRODUODENOSCOPY (EGD) WITH PROPOFOL N/A 04/05/2018   Procedure: ESOPHAGOGASTRODUODENOSCOPY (EGD) WITH PROPOFOL;  Surgeon: Doran Stabler, MD;  Location: [place];  Service: Gastroenterology;  Laterality: N/A;   VARICOSE VEIN SURGERY      Current Medications: Current Meds  Medication Sig   acetaminophen (TYLENOL) 500 MG tablet Take 1,000 mg by mouth daily as needed for mild pain, moderate pain, fever or headache.   albuterol (PROVENTIL HFA;VENTOLIN HFA) 108 (90 Base) MCG/ACT inhaler Inhale 1-2 puffs into the lungs every 6 (six) hours as needed for wheezing or shortness of breath.   amiodarone (PACERONE) 200 MG tablet Take 200 mg by mouth daily. Pt takes 200mg  1 tab QD, then take 2 tab 400 mg BID for 7 days, then take 1 tab 200 mg every day as instructed.   BREO ELLIPTA 100-25 MCG/INH AEPB Inhale 1 puff into the lungs daily.   carboxymethylcellul-glycerin (LUBRICATING EYE DROPS) 0.5-0.9 % ophthalmic solution Place 1 drop into both eyes 3 (three) times daily as needed for dry eyes.   cholecalciferol (VITAMIN D3) 25 MCG (1000 UNIT) tablet Take 1,000 Units by mouth daily.   clonazePAM (KLONOPIN) 0.5 MG tablet Take 0.5 mg by  mouth as needed.   ELIQUIS 5 MG TABS tablet TAKE 1 TABLET BY MOUTH TWICE A DAY   ferrous sulfate 325 (65 FE) MG EC tablet Take 325 mg by mouth 3 (three) times a week.   hydroxychloroquine (PLAQUENIL) 200 MG tablet Take 200 mg by mouth 2 (two) times daily.    ketoconazole (NIZORAL) 2 % cream Apply 1 application topically 2 (two) times daily as needed for rash.   Melatonin 10 MG TABS Take 10 mg by mouth at bedtime as needed (sleep).   metoprolol  tartrate (LOPRESSOR) 25 MG tablet TAKE 1 & 1/2 TABLETS (37.5 MG TOTAL) BY MOUTH 2 (TWO) TIMES DAILY.   Multiple Vitamins-Minerals (MULTIVITAMIN WITH MINERALS) tablet Take 1 tablet by mouth daily.   mycophenolate (CELLCEPT) 500 MG tablet Take 500 mg by mouth 2 (two) times daily.   polyethylene glycol (MIRALAX / GLYCOLAX) packet Take 17 g by mouth daily as needed for mild constipation.      Allergies:   Hctz [hydrochlorothiazide]   Social History   Socioeconomic History   Marital status: Married    Spouse name: Not on file   Number of children: Not on file   Years of education: Not on file   Highest education level: Not on file  Occupational History   Not on file  Tobacco Use   Smoking status: Never   Smokeless tobacco: Never  Vaping Use   Vaping Use: Never used  Substance and Sexual Activity   Alcohol use: No   Drug use: No   Sexual activity: Not on file  Other Topics Concern   Not on file  Social History Narrative   Not on file   Social Determinants of Health   Financial Resource Strain: Not on file  Food Insecurity: Not on file  Transportation Needs: Not on file  Physical Activity: Not on file  Stress: Not on file  Social Connections: Not on file     Family History: The patient's family history includes Diabetes Mellitus II in her father and mother.  ROS:   Please see the history of present illness.    Feels depressed.  Tearful.  All other systems reviewed and are negative.  EKGs/Labs/Other Studies Reviewed:    The following studies were reviewed today: No new data.  EKG:  EKG atrial flutter with 3-1 AV conduction and otherwise unremarkable.  Recent Labs: 01/06/2021: BUN 9; Creatinine, Ser 0.63; Potassium 4.2; Sodium 139  Recent Lipid Panel No results found for: CHOL, TRIG, HDL, CHOLHDL, VLDL, LDLCALC, LDLDIRECT  Physical Exam:    VS:  BP 124/70   Pulse 83   Ht 5\' 5"  (1.651 m)   Wt 272 lb 12.8 oz (123.7 kg)   SpO2 100%   BMI 45.40 kg/m     Wt  Readings from Last 3 Encounters:  07/07/21 272 lb 12.8 oz (123.7 kg)  03/20/21 218 lb 3.2 oz (99 kg)  09/03/20 217 lb 9.6 oz (98.7 kg)     GEN: Obese.  Facial expression suggest depression and anxiety. No acute distress HEENT: Normal NECK: No JVD. LYMPHATICS: No lymphadenopathy CARDIAC: No murmur. RRR no gallop, or edema. VASCULAR:  Normal Pulses. No bruits. RESPIRATORY:  Clear to auscultation without rales, wheezing or rhonchi  ABDOMEN: Soft, non-tender, non-distended, No pulsatile mass, MUSCULOSKELETAL: No deformity  SKIN: Warm and dry NEUROLOGIC:  Alert and oriented x 3 PSYCHIATRIC:  Normal affect   ASSESSMENT:    1. Paroxysmal atrial fibrillation (HCC)   2. Pericardial effusion   3.  H/O amiodarone therapy   4. SLE (systemic lupus erythematosus related syndrome) ([place])   5. Essential hypertension   6. OSA (obstructive sleep apnea)   7. Chronic anticoagulation    PLAN:    In order of problems listed above:  She is in atrial flutter with variable AV block/history of atrial fibrillation in the background.  Also has obstructive sleep apnea, not being treated.  She has now been on amiodarone therapy for 3-1/2 weeks.  We will plan to proceed within the next week with outpatient elective electrical cardioversion. Surgical drainage on 06/10/2021. She has had a 1 week loading with amiodarone 2 tablets twice a day (400 mg twice daily) for 7 days ending on November 17.  Since that time she has had 1 week of amiodarone 200 mg twice daily for 1 week then increase to 200 mg twice daily since that time.  We will decrease amiodarone to once per day after cardioversion this coming Thursday. Pericardial fluid did not reveal any evidence of cancer or active inflammation. Excellent current blood pressure control We need to ensure that his sleep apnea is being adequately treated.  She is definitely not wearing CPAP at this time. She has been on uninterrupted anticoagulation since completion of the  pericardial window, having been resumed prior to discharge on 06/10/2021.   Medication Adjustments/Labs and Tests Ordered: Current medicines are reviewed at length with the patient today.  Concerns regarding medicines are outlined above.  Orders Placed This Encounter  Procedures   CBC   Basic metabolic panel   EKG [id number]   No orders of the defined types were placed in this encounter.     Signed, Sinclair Grooms, MD  07/07/2021 9:25 AM    [facility]

## 2021-07-06 NOTE — Progress Notes (Signed)
Cardiology Office Note:    Date:  07/07/2021   ID:  Amy Beard, DOB Jul 11, 1956, MRN [id number]  PCP:  Andria Frames, PA-C  Cardiologist:  Sinclair Grooms, MD   Referring MD: Andria Frames, PA-C   Chief Complaint  Patient presents with   Atrial Fibrillation   Follow-up    Postoperative atrial flutter following pericardial window    History of Present Illness:    Amy Beard is a 65 y.o. 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 Subxyphoid pericardial window Dr. Clementeen Graham, [facility] 06/05/2021. Post op AF with failed electrical CV x2 06/10/2021.  Discharged 06/12/2021.from hospital on metoprolol and amiodarone with plan for Cv in 2-3 weeks. Patient never started Amiodarone.  Pericardial biopsy without inflammation, negative gram stain, cell count43% lymphocytes and 16 % monocytes.  She feels terrible.  She had lightens me today that she was on 200 mg tablets 2 tablets twice a day from discharged on 06/12/2021 for 1 week and then decrease to 200 mg daily until we called her last Monday and asked her to increase the amiodarone back up to 200 mg twice daily which she continues to take.  She has no exertional tolerance.  She is accompanied by her husband today.  She is very depressed concerning her overall condition.  She is trying to work despite being in atrial flutter which prevents significant physical activity.  She does not appear to be in heart failure.  Her O2 saturation is normal.  She will see Dr. Clementeen Graham tomorrow for postop follow-up.    She is not having nausea or difficulty with amiodarone tablets.  She denies orthopnea.  She is not having lower extremity swelling.  Anticoagulation was started prior to discharge on 06/10/2021.  She has had uninterrupted anticoagulation since that time.  Past Medical History:   Diagnosis Date   GI bleeding 04/2018   Hypertension    Lupus ([place])     Past Surgical History:  Procedure Laterality Date   COLONOSCOPY WITH PROPOFOL N/A 04/05/2018   Procedure: COLONOSCOPY WITH PROPOFOL;  Surgeon: Doran Stabler, MD;  Location: [facility];  Service: Gastroenterology;  Laterality: N/A;   ESOPHAGOGASTRODUODENOSCOPY (EGD) WITH PROPOFOL N/A 04/05/2018   Procedure: ESOPHAGOGASTRODUODENOSCOPY (EGD) WITH PROPOFOL;  Surgeon: Doran Stabler, MD;  Location: [facility];  Service: Gastroenterology;  Laterality: N/A;   VARICOSE VEIN SURGERY      Current Medications: Current Meds  Medication Sig   acetaminophen (TYLENOL) 500 MG tablet Take 1,000 mg by mouth daily as needed for mild pain, moderate pain, fever or headache.   albuterol (PROVENTIL HFA;VENTOLIN HFA) 108 (90 Base) MCG/ACT inhaler Inhale 1-2 puffs into the lungs every 6 (six) hours as needed for wheezing or shortness of breath.   amiodarone (PACERONE) 200 MG tablet Take 200 mg by mouth daily. Pt takes 200mg  1 tab QD, then take 2 tab 400 mg BID for 7 days, then take 1 tab 200 mg every day as instructed.   BREO ELLIPTA 100-25 MCG/INH AEPB Inhale 1 puff into the lungs daily.   carboxymethylcellul-glycerin (LUBRICATING EYE DROPS) 0.5-0.9 % ophthalmic solution Place 1 drop into both eyes 3 (three) times daily as needed for dry eyes.   cholecalciferol (VITAMIN D3) 25 MCG (1000 UNIT) tablet Take 1,000 Units by mouth daily.   clonazePAM (KLONOPIN) 0.5 MG tablet Take 0.5 mg by  mouth as needed.   ELIQUIS 5 MG TABS tablet TAKE 1 TABLET BY MOUTH TWICE A DAY   ferrous sulfate 325 (65 FE) MG EC tablet Take 325 mg by mouth 3 (three) times a week.   hydroxychloroquine (PLAQUENIL) 200 MG tablet Take 200 mg by mouth 2 (two) times daily.    ketoconazole (NIZORAL) 2 % cream Apply 1 application topically 2 (two) times daily as needed for rash.   Melatonin 10 MG TABS Take 10 mg by mouth at bedtime as needed (sleep).   metoprolol  tartrate (LOPRESSOR) 25 MG tablet TAKE 1 & 1/2 TABLETS (37.5 MG TOTAL) BY MOUTH 2 (TWO) TIMES DAILY.   Multiple Vitamins-Minerals (MULTIVITAMIN WITH MINERALS) tablet Take 1 tablet by mouth daily.   mycophenolate (CELLCEPT) 500 MG tablet Take 500 mg by mouth 2 (two) times daily.   polyethylene glycol (MIRALAX / GLYCOLAX) packet Take 17 g by mouth daily as needed for mild constipation.      Allergies:   Hctz [hydrochlorothiazide]   Social History   Socioeconomic History   Marital status: Married    Spouse name: Not on file   Number of children: Not on file   Years of education: Not on file   Highest education level: Not on file  Occupational History   Not on file  Tobacco Use   Smoking status: Never   Smokeless tobacco: Never  Vaping Use   Vaping Use: Never used  Substance and Sexual Activity   Alcohol use: No   Drug use: No   Sexual activity: Not on file  Other Topics Concern   Not on file  Social History Narrative   Not on file   Social Determinants of Health   Financial Resource Strain: Not on file  Food Insecurity: Not on file  Transportation Needs: Not on file  Physical Activity: Not on file  Stress: Not on file  Social Connections: Not on file     Family History: The patient's family history includes Diabetes Mellitus II in her father and mother.  ROS:   Please see the history of present illness.    Feels depressed.  Tearful.  All other systems reviewed and are negative.  EKGs/Labs/Other Studies Reviewed:    The following studies were reviewed today: No new data.  EKG:  EKG atrial flutter with 3-1 AV conduction and otherwise unremarkable.  Recent Labs: 01/06/2021: BUN 9; Creatinine, Ser 0.63; Potassium 4.2; Sodium 139  Recent Lipid Panel No results found for: CHOL, TRIG, HDL, CHOLHDL, VLDL, LDLCALC, LDLDIRECT  Physical Exam:    VS:  BP 124/70   Pulse 83   Ht 5\' 5"  (1.651 m)   Wt 272 lb 12.8 oz (123.7 kg)   SpO2 100%   BMI 45.40 kg/m     Wt  Readings from Last 3 Encounters:  07/07/21 272 lb 12.8 oz (123.7 kg)  03/20/21 218 lb 3.2 oz (99 kg)  09/03/20 217 lb 9.6 oz (98.7 kg)     GEN: Obese.  Facial expression suggest depression and anxiety. No acute distress HEENT: Normal NECK: No JVD. LYMPHATICS: No lymphadenopathy CARDIAC: No murmur. RRR no gallop, or edema. VASCULAR:  Normal Pulses. No bruits. RESPIRATORY:  Clear to auscultation without rales, wheezing or rhonchi  ABDOMEN: Soft, non-tender, non-distended, No pulsatile mass, MUSCULOSKELETAL: No deformity  SKIN: Warm and dry NEUROLOGIC:  Alert and oriented x 3 PSYCHIATRIC:  Normal affect   ASSESSMENT:    1. Paroxysmal atrial fibrillation (HCC)   2. Pericardial effusion   3.  H/O amiodarone therapy   4. SLE (systemic lupus erythematosus related syndrome) ([place])   5. Essential hypertension   6. OSA (obstructive sleep apnea)   7. Chronic anticoagulation    PLAN:    In order of problems listed above:  She is in atrial flutter with variable AV block/history of atrial fibrillation in the background.  Also has obstructive sleep apnea, not being treated.  She has now been on amiodarone therapy for 3-1/2 weeks.  We will plan to proceed within the next week with outpatient elective electrical cardioversion. Surgical drainage on 06/10/2021. She has had a 1 week loading with amiodarone 2 tablets twice a day (400 mg twice daily) for 7 days ending on November 17.  Since that time she has had 1 week of amiodarone 200 mg twice daily for 1 week then increase to 200 mg twice daily since that time.  We will decrease amiodarone to once per day after cardioversion this coming Thursday. Pericardial fluid did not reveal any evidence of cancer or active inflammation. Excellent current blood pressure control We need to ensure that his sleep apnea is being adequately treated.  She is definitely not wearing CPAP at this time. She has been on uninterrupted anticoagulation since completion of the  pericardial window, having been resumed prior to discharge on 06/10/2021.   Medication Adjustments/Labs and Tests Ordered: Current medicines are reviewed at length with the patient today.  Concerns regarding medicines are outlined above.  Orders Placed This Encounter  Procedures   CBC   Basic metabolic panel   EKG [id number]   No orders of the defined types were placed in this encounter.     Signed, Sinclair Grooms, MD  07/07/2021 9:25 AM    [facility]

## 2021-07-07 ENCOUNTER — Encounter: Payer: Self-pay | Admitting: Interventional Cardiology

## 2021-07-07 ENCOUNTER — Other Ambulatory Visit: Payer: Self-pay

## 2021-07-07 ENCOUNTER — Ambulatory Visit: Payer: BLUE CROSS/BLUE SHIELD | Admitting: Interventional Cardiology

## 2021-07-07 VITALS — BP 124/70 | HR 83 | Ht 65.0 in | Wt 272.8 lb

## 2021-07-07 DIAGNOSIS — I10 Essential (primary) hypertension: Secondary | ICD-10-CM

## 2021-07-07 DIAGNOSIS — G4733 Obstructive sleep apnea (adult) (pediatric): Secondary | ICD-10-CM

## 2021-07-07 DIAGNOSIS — I3139 Other pericardial effusion (noninflammatory): Secondary | ICD-10-CM

## 2021-07-07 DIAGNOSIS — Z9229 Personal history of other drug therapy: Secondary | ICD-10-CM | POA: Diagnosis not present

## 2021-07-07 DIAGNOSIS — I480 Paroxysmal atrial fibrillation: Secondary | ICD-10-CM

## 2021-07-07 DIAGNOSIS — M329 Systemic lupus erythematosus, unspecified: Secondary | ICD-10-CM

## 2021-07-07 DIAGNOSIS — Z7901 Long term (current) use of anticoagulants: Secondary | ICD-10-CM

## 2021-07-07 LAB — CBC
Hematocrit: 36 % (ref 34.0–46.6)
Hemoglobin: 11.5 g/dL (ref 11.1–15.9)
MCH: 24.8 pg — ABNORMAL LOW (ref 26.6–33.0)
MCHC: 31.9 g/dL (ref 31.5–35.7)
MCV: 78 fL — ABNORMAL LOW (ref 79–97)
Platelets: 287 10*3/uL (ref 150–450)
RBC: 4.64 x10E6/uL (ref 3.77–5.28)
RDW: 14.2 % (ref 11.7–15.4)
WBC: 4.5 10*3/uL (ref 3.4–10.8)

## 2021-07-07 LAB — BASIC METABOLIC PANEL
BUN/Creatinine Ratio: 13 (ref 12–28)
BUN: 9 mg/dL (ref 8–27)
CO2: 27 mmol/L (ref 20–29)
Calcium: 9.1 mg/dL (ref 8.7–10.3)
Chloride: 104 mmol/L (ref 96–106)
Creatinine, Ser: 0.69 mg/dL (ref 0.57–1.00)
Glucose: 81 mg/dL (ref 70–99)
Potassium: 4.2 mmol/L (ref 3.5–5.2)
Sodium: 141 mmol/L (ref 134–144)
eGFR: 97 mL/min/{1.73_m2} (ref >59)

## 2021-07-07 NOTE — Patient Instructions (Signed)
Medication Instructions:  1) AFTER CARDIOVERSION, decrease Amiodarone to 200mg  once daily  *If you need a refill on your cardiac medications before your next appointment, please call your pharmacy*   Lab Work: BMET and CBC today  If you have labs (blood work) drawn today and your tests are completely normal, you will receive your results only by: MyChart Message (if you have MyChart) OR A paper copy in the mail If you have any lab test that is abnormal or we need to change your treatment, we will call you to review the results.   Testing/Procedures: Your physician has recommended that you have a Cardioversion (DCCV). Electrical Cardioversion uses a jolt of electricity to your heart either through paddles or wired patches attached to your chest. This is a controlled, usually prescheduled, procedure. Defibrillation is done under light anesthesia in the hospital, and you usually go home the day of the procedure. This is done to get your heart back into a normal rhythm. You are not awake for the procedure. Please see the instruction sheet given to you today.   Follow-Up: At [facility], you and your health needs are our priority.  As part of our continuing mission to provide you with exceptional heart care, we have created designated Provider Care Teams.  These Care Teams include your primary Cardiologist (physician) and Advanced Practice Providers (APPs -  Physician Assistants and Nurse Practitioners) who all work together to provide you with the care you need, when you need it.  We recommend signing up for the patient portal called "MyChart".  Sign up information is provided on this After Visit Summary.  MyChart is used to connect with patients for Virtual Visits (Telemedicine).  Patients are able to view lab/test results, encounter notes, upcoming appointments, etc.  Non-urgent messages can be sent to your provider as well.   To learn more about what you can do with MyChart, go to  [facility].    Your next appointment:   1 month(s)  The format for your next appointment:   In Person  Provider:   [email or web address], MD     Other Instructions  You are scheduled for a Cardioversion on Thursday, December 8th, 2022 with Dr. December 10th, 2022.  Please arrive at the [facility] (Main Entrance A) at [facility]: [address] [place], [place] at 12:30pm.  DIET: Nothing to eat or drink after midnight except a sip of water with medications (see medication instructions below)  FYI: For your safety, and to allow [id number] to monitor your vital signs accurately during the surgery/procedure we request that   if you have artificial nails, gel coating, SNS etc. Please have those removed prior to your surgery/procedure. Not having the nail coverings /polish removed may result in cancellation or delay of your surgery/procedure.   Medication Instructions:  Continue your anticoagulant: Eliquis You will need to continue your anticoagulant after your procedure until you  are told by your  Provider that it is safe to stop   Labs: You will have labs drawn today.  You must have a responsible person to drive you home and stay in the waiting area during your procedure. Failure to do so could result in cancellation.  Bring your insurance cards.  *Special Note: Every effort is made to have your procedure done on time. Occasionally there are emergencies that occur at the hospital that may cause delays. Please be patient if a delay does occur.

## 2021-07-09 ENCOUNTER — Other Ambulatory Visit: Payer: Self-pay | Admitting: Cardiovascular Disease

## 2021-07-09 DIAGNOSIS — I480 Paroxysmal atrial fibrillation: Secondary | ICD-10-CM

## 2021-07-10 ENCOUNTER — Encounter (HOSPITAL_COMMUNITY)
Admission: RE | Disposition: A | Discharge status: Home / Self Care | Payer: Self-pay | Source: Home / Self Care | Attending: Cardiovascular Disease

## 2021-07-10 ENCOUNTER — Encounter (HOSPITAL_COMMUNITY): Payer: Self-pay | Admitting: Cardiovascular Disease

## 2021-07-10 ENCOUNTER — Ambulatory Visit (HOSPITAL_COMMUNITY): Payer: BLUE CROSS/BLUE SHIELD | Admitting: Anesthesiology

## 2021-07-10 ENCOUNTER — Ambulatory Visit (HOSPITAL_COMMUNITY)
Admission: RE | Admit: 2021-07-10 | Discharge: 2021-07-10 | Disposition: A | Discharge status: Home / Self Care | Payer: BLUE CROSS/BLUE SHIELD | Attending: Cardiovascular Disease | Admitting: Cardiovascular Disease

## 2021-07-10 DIAGNOSIS — G4733 Obstructive sleep apnea (adult) (pediatric): Secondary | ICD-10-CM | POA: Insufficient documentation

## 2021-07-10 DIAGNOSIS — I4891 Unspecified atrial fibrillation: Secondary | ICD-10-CM | POA: Diagnosis not present

## 2021-07-10 DIAGNOSIS — I480 Paroxysmal atrial fibrillation: Secondary | ICD-10-CM | POA: Insufficient documentation

## 2021-07-10 DIAGNOSIS — I3139 Other pericardial effusion (noninflammatory): Secondary | ICD-10-CM | POA: Insufficient documentation

## 2021-07-10 DIAGNOSIS — M329 Systemic lupus erythematosus, unspecified: Secondary | ICD-10-CM | POA: Insufficient documentation

## 2021-07-10 DIAGNOSIS — Z7901 Long term (current) use of anticoagulants: Secondary | ICD-10-CM | POA: Diagnosis not present

## 2021-07-10 DIAGNOSIS — I10 Essential (primary) hypertension: Secondary | ICD-10-CM | POA: Diagnosis not present

## 2021-07-10 DIAGNOSIS — I4892 Unspecified atrial flutter: Secondary | ICD-10-CM | POA: Diagnosis not present

## 2021-07-10 HISTORY — PX: CARDIOVERSION: SHX1299

## 2021-07-10 SURGERY — CARDIOVERSION
Anesthesia: General

## 2021-07-10 MED ORDER — PROPOFOL 10 MG/ML IV BOLUS
INTRAVENOUS | Status: DC | PRN
  Administered 2021-07-10: 40 mg via INTRAVENOUS
  Administered 2021-07-10: 60 mg via INTRAVENOUS
  Administered 2021-07-10: 50 mg via INTRAVENOUS

## 2021-07-10 MED ORDER — LIDOCAINE 2% (20 MG/ML) 5 ML SYRINGE
INTRAMUSCULAR | Status: DC | PRN
Start: 2021-07-10 — End: 2021-07-10
  Administered 2021-07-10: 60 mg via INTRAVENOUS

## 2021-07-10 MED ORDER — SODIUM CHLORIDE 0.9 % IV SOLN: INTRAVENOUS | Status: DC

## 2021-07-10 NOTE — Transfer of Care (Signed)
Immediate Anesthesia Transfer of Care Note  Patient: Amy Beard  Procedure(s) Performed: CARDIOVERSION  Patient Location: Endoscopy Unit  Anesthesia Type:General  Level of Consciousness: drowsy, patient cooperative and responds to stimulation  Airway & Oxygen Therapy: Patient Spontanous Breathing and Patient connected to face mask oxygen  Post-op Assessment: Report given to RN and Post -op Vital signs reviewed and stable  Post vital signs: Reviewed and stable  Last Vitals:  Vitals Value Taken Time  BP 114/80   Temp    Pulse 64   Resp 12   SpO2 100     Last Pain:  Vitals:   07/10/21 1250  TempSrc: Temporal  PainSc: 0-No pain         Complications: No notable events documented.

## 2021-07-10 NOTE — CV Procedure (Signed)
DCC: Anesthesia: Propofol On Rx eliquis no missed doses  DCC x 4 first with 150 J's biphasic the 3 times at 200J's biphasic with manual AP compression  Unable to restore NSR with all 4 shocks Not a single sinus beat  Patient will f/u with Dr Katrinka Blazing to discuss alternative strategy She has already been on amiodarone   Charlton Haws MD [facility]

## 2021-07-10 NOTE — Anesthesia Procedure Notes (Signed)
Procedure Name: General with mask airway Date/Time: 07/10/2021 1:19 PM Performed by: Aundria Rud, CRNA Pre-anesthesia Checklist: Patient identified, Emergency Drugs available, Suction available and Patient being monitored Patient Re-evaluated:Patient Re-evaluated prior to induction Oxygen Delivery Method: Simple face mask Preoxygenation: Pre-oxygenation with 100% oxygen Induction Type: IV induction Placement Confirmation: positive ETCO2 and CO2 detector Dental Injury: Teeth and Oropharynx as per pre-operative assessment

## 2021-07-10 NOTE — Interval H&P Note (Signed)
History and Physical Interval Note:  07/10/2021 1:48 PM  Amy Beard  has presented today for surgery, with the diagnosis of afib.  The various methods of treatment have been discussed with the patient and family. After consideration of risks, benefits and other options for treatment, the patient has consented to  Procedure(s): CARDIOVERSION (N/A) as a surgical intervention.  The patient's history has been reviewed, patient examined, no change in status, stable for surgery.  I have reviewed the patient's chart and labs.  Questions were answered to the patient's satisfaction.     Charlton Haws

## 2021-07-10 NOTE — Anesthesia Preprocedure Evaluation (Signed)
Anesthesia Evaluation  Patient identified by MRN, date of birth, ID band Patient awake    Reviewed: Allergy & Precautions, H&P , NPO status , Patient's Chart, lab work & pertinent test results  Airway Mallampati: II   Neck ROM: full    Dental   Pulmonary neg pulmonary ROS,    breath sounds clear to auscultation       Cardiovascular hypertension, + dysrhythmias Atrial Fibrillation  Rhythm:irregular Rate:Normal     Neuro/Psych    GI/Hepatic H/o GI bleed   Endo/Other    Renal/GU      Musculoskeletal   Abdominal   Peds  Hematology   Anesthesia Other Findings   Reproductive/Obstetrics                             Anesthesia Physical Anesthesia Plan  ASA: 3  Anesthesia Plan: General   Post-op Pain Management:    Induction: Intravenous  PONV Risk Score and Plan: 3 and Propofol infusion and Treatment may vary due to age or medical condition  Airway Management Planned: Mask  Additional Equipment:   Intra-op Plan:   Post-operative Plan:   Informed Consent: I have reviewed the patients History and Physical, chart, labs and discussed the procedure including the risks, benefits and alternatives for the proposed anesthesia with the patient or authorized representative who has indicated his/her understanding and acceptance.     Dental advisory given  Plan Discussed with: CRNA, Anesthesiologist and Surgeon  Anesthesia Plan Comments:         Anesthesia Quick Evaluation

## 2021-07-10 NOTE — Discharge Instructions (Signed)

## 2021-07-11 ENCOUNTER — Other Ambulatory Visit: Payer: Self-pay | Admitting: *Deleted

## 2021-07-11 DIAGNOSIS — I480 Paroxysmal atrial fibrillation: Secondary | ICD-10-CM

## 2021-07-13 ENCOUNTER — Encounter (HOSPITAL_COMMUNITY): Payer: Self-pay | Admitting: Cardiovascular Disease

## 2021-07-15 NOTE — Anesthesia Postprocedure Evaluation (Signed)
Anesthesia Post Note  Patient: Amy Beard  Procedure(s) Performed: CARDIOVERSION     Patient location during evaluation: Endoscopy Anesthesia Type: General Level of consciousness: awake and alert Pain management: pain level controlled Vital Signs Assessment: post-procedure vital signs reviewed and stable Respiratory status: spontaneous breathing, nonlabored ventilation, respiratory function stable and patient connected to nasal cannula oxygen Cardiovascular status: blood pressure returned to baseline and stable Postop Assessment: no apparent nausea or vomiting Anesthetic complications: no   No notable events documented.  Last Vitals:  Vitals:   07/10/21 1353 07/10/21 1403  BP: 111/78 116/79  Pulse: 85 72  Resp: (!) 22 17  Temp:    SpO2: 100% 100%    Last Pain:  Vitals:   07/10/21 1403  TempSrc:   PainSc: 0-No pain                 Lauren Aguayo S

## 2021-07-25 ENCOUNTER — Other Ambulatory Visit: Payer: Self-pay | Admitting: Interventional Cardiology

## 2021-07-25 NOTE — Telephone Encounter (Signed)
Prescription refill request for Eliquis received. Indication: Afib  Last office visit:07/07/21 Amy Beard)  Scr: 0.69 (07/07/21)  Age: 65 Weight: 123.7  Appropriate dose and refill sent to requested pharmacy.

## 2021-08-04 NOTE — Progress Notes (Signed)
°  Cardiology Office Note:    Date:  08/04/2021   ID:  AHSAKI FESPERMAN, DOB 1956-05-02, MRN [id number]  PCP:  Andria Frames PA-C  Cardiologist:  Sinclair Grooms, MD   Referring MD: Andria Frames, PA-C   No chief complaint on file.   History of Present Illness:    Amy Beard is a 66 y.o. 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 Subxyphoid pericardial window Dr. Clementeen Graham, [facility] 06/05/2021. Post op AF with failed electrical CV x2 06/10/2021. Failed cardioversion [place] on amiodarone 07/07/2021.   She did not keep today's office appointment.  I became concerned and called her.  She states that she was not aware there was an appointment with me.  She is already scheduled to see Dr. Curt Bears on January 26.  I discussed her case with Dr. Curt Bears prior to my phone call with her to have a game plan.  This is worked out well.  Luckily, I believe that even though she had failed cardioversion, she is now back in sinus rhythm as she states her smart watch is stating her rate is in the 60-70 range consistently.  We will continue amiodarone 200 mg/day.  She will see Dr. Curt Bears before the month is over.  Again plan for management of amiodarone and A. fib going forward will be established from that relationship.    Signed, Sinclair Grooms, MD  08/04/2021 9:51 AM    [place]

## 2021-08-06 ENCOUNTER — Other Ambulatory Visit: Payer: Self-pay | Admitting: *Deleted

## 2021-08-06 ENCOUNTER — Ambulatory Visit (INDEPENDENT_AMBULATORY_CARE_PROVIDER_SITE_OTHER): Payer: BLUE CROSS/BLUE SHIELD | Admitting: Interventional Cardiology

## 2021-08-06 DIAGNOSIS — I3139 Other pericardial effusion (noninflammatory): Secondary | ICD-10-CM

## 2021-08-06 DIAGNOSIS — I480 Paroxysmal atrial fibrillation: Secondary | ICD-10-CM

## 2021-08-06 DIAGNOSIS — M329 Systemic lupus erythematosus, unspecified: Secondary | ICD-10-CM

## 2021-08-06 DIAGNOSIS — G4733 Obstructive sleep apnea (adult) (pediatric): Secondary | ICD-10-CM

## 2021-08-06 DIAGNOSIS — I10 Essential (primary) hypertension: Secondary | ICD-10-CM

## 2021-08-06 DIAGNOSIS — Z7901 Long term (current) use of anticoagulants: Secondary | ICD-10-CM

## 2021-08-06 MED ORDER — AMIODARONE HCL 200 MG PO TABS: 200.0000 mg | ORAL_TABLET | Freq: Every day | ORAL | 3 refills | Status: DC

## 2021-08-09 ENCOUNTER — Encounter: Payer: Self-pay | Admitting: Interventional Cardiology

## 2021-08-28 ENCOUNTER — Encounter: Payer: Self-pay | Admitting: Cardiology

## 2021-08-28 ENCOUNTER — Other Ambulatory Visit: Payer: Self-pay

## 2021-08-28 ENCOUNTER — Ambulatory Visit (INDEPENDENT_AMBULATORY_CARE_PROVIDER_SITE_OTHER): Payer: BLUE CROSS/BLUE SHIELD | Admitting: Cardiology

## 2021-08-28 VITALS — BP 120/68 | HR 95 | Ht 65.0 in | Wt 212.2 lb

## 2021-08-28 DIAGNOSIS — I4819 Other persistent atrial fibrillation: Secondary | ICD-10-CM

## 2021-08-28 NOTE — Patient Instructions (Addendum)
Medication Instructions:  Your physician recommends that you continue on your current medications as directed. Please refer to the Current Medication list given to you today.  *If you need a refill on your cardiac medications before your next appointment, please call your pharmacy*   Lab Work: Pre procedure labs _________ :  BMP & CBC  If you have labs (blood work) drawn today and your tests are completely normal, you will receive your results only by: [place] (if you have MyChart) OR A paper copy in the mail If you have any lab test that is abnormal or we need to change your treatment, we will call you to review the results.   Testing/Procedures: Your physician has requested that you have cardiac CT within 7 days PRIOR to your ablation. Cardiac computed tomography (CT) is a painless test that uses an x-ray machine to take clear, detailed pictures of your heart.  Please follow instruction below located under "other instructions". You will get a call from our office to schedule the date for this test.  Your physician has recommended that you have an ablation. Catheter ablation is a medical procedure used to treat some cardiac arrhythmias (irregular heartbeats). During catheter ablation, a long, thin, flexible tube is put into a blood vessel in your groin (upper thigh), or neck. This tube is called an ablation catheter. It is then guided to your heart through the blood vessel. Radio frequency waves destroy small areas of heart tissue where abnormal heartbeats may cause an arrhythmia to start. Please follow instruction below located under "other instructions".   Follow-Up: At [facility], you and your health needs are our priority.  As part of our continuing mission to provide you with exceptional heart care, we have created designated Provider Care Teams.  These Care Teams include your primary Cardiologist (physician) and Advanced Practice Providers (APPs -  Physician Assistants and  Nurse Practitioners) who all work together to provide you with the care you need, when you need it.  We recommend signing up for the patient portal called "MyChart".  Sign up information is provided on this After Visit Summary.  MyChart is used to connect with patients for Virtual Visits (Telemedicine).  Patients are able to view lab/test results, encounter notes, upcoming appointments, etc.  Non-urgent messages can be sent to your provider as well.   To learn more about what you can do with MyChart, go to [email or web address].    Your next appointment:   1 month(s) after your ablation  The format for your next appointment:   In Person  Provider:   AFib clinic   Thank you for choosing [facility]!!   Trinidad Curet, RN [phone]    Other Instructions  Please call your insurance and check on coverage & out of pocket cost for an Atrial Fibrillation ablation.   CPT code is 93656   CT INSTRUCTIONS Your cardiac CT will be scheduled at:  [facility] [address] [place], [place] [address] [phone]  Please arrive at the [facility] main entrance of [facility] 30 minutes prior to test start time. Proceed to the [facility] Radiology Department (first floor) to check-in and test prep.   Please follow these instructions carefully (unless otherwise directed):  Hold all erectile dysfunction medications at least 3 days (72 hrs) prior to test.  On the Night Before the Test: Be sure to Drink plenty of water. Do not consume any caffeinated/decaffeinated beverages or chocolate 12 hours prior to your  test. Do not take any antihistamines 12 hours prior to your test.  On the Day of the Test: Drink plenty of water until 1 hour prior to the test. Do not eat any food 4 hours prior to the test. You may take your regular medications prior to the test.  Take metoprolol (Lopressor) two hours prior to test. HOLD Furosemide/Hydrochlorothiazide morning of  the test. FEMALES- please wear underwire-free bra if available      After the Test: Drink plenty of water. After receiving IV contrast, you may experience a mild flushed feeling. This is normal. On occasion, you may experience a mild rash up to 24 hours after the test. This is not dangerous. If this occurs, you can take Benadryl 25 mg and increase your fluid intake. If you experience trouble breathing, this can be serious. If it is severe call 911 IMMEDIATELY. If it is mild, please call our office. If you take any of these medications: Glipizide/Metformin, Avandament, Glucavance, please do not take 48 hours after completing test unless otherwise instructed.   Once we have confirmed authorization from your insurance company, we will call you to set up a date and time for your test. Based on how quickly your insurance processes prior authorizations requests, please allow up to 4 weeks to be contacted for scheduling your Cardiac CT appointment. Be advised that routine Cardiac CT appointments could be scheduled as many as 8 weeks after your provider has ordered it.  For non-scheduling related questions, please contact the cardiac imaging nurse navigator should you have any questions/concerns: Marchia Bond, Cardiac Imaging Nurse Navigator Gordy Clement, Cardiac Imaging Nurse Navigator [facility] Direct Office Dial: [phone]   For scheduling needs, including cancellations and rescheduling, please call [place], [phone].      Electrophysiology/Ablation Procedure Instructions   You are scheduled for a(n)  ablation on ______________ with Dr. Allegra Lai.   1.   Pre procedure testing-             A.  LAB WORK --- On ______________ for your pre procedure blood work.  You do NOT need to be fasting.     On the day of your procedure _____________ you will go to [facility] hospital [phone]) at _____________.  You will go to the main entrance [address] [employer]) and enter where the [employer] parking staff are.  Your driver will drop you off and you will head down the hallway to ADMITTING.  You may have one support person come in to the hospital with you.  They will be asked to wait in the waiting room. It is OK to have someone drop you off and come back when you are ready to be discharged.   3.   Do not eat or drink after midnight prior to your procedure.   4.   On the morning of your procedure do NOT take any medication. Do not miss any doses of your blood thinner prior to the morning of your procedure or your procedure will need to be rescheduled.   5.  Plan for an overnight stay but you may be discharged after your procedure, if you use your phone frequently bring your phone charger. If you are discharged after your procedure you will need someone to drive you home and be with you for 24 hours after your procedure.   6. You will follow up with the AFIB clinic 4 weeks after your procedure.  You will follow up with  Dr. Curt Bears  3 months after your procedure.  These appointments will be made for you.   7. FYI: For your safety, and to allow [place] to monitor your vital signs accurately during the surgery/procedure we request that if you have artificial nails, gel coating, SNS etc. Please have those removed prior to your surgery/procedure. Not having the nail coverings /polish removed may result in cancellation or delay of your surgery/procedure.  * If you have ANY questions please call the office [phone] and ask for Kameron Blethen RN or send me a MyChart message   * Occasionally, EP Studies and ablations can become lengthy.  Please make your family aware of this before your procedure starts.  Average time ranges from 2-8 hours for EP studies/ablations.  Your physician will call your family after the procedure with the results.                                    Cardiac Ablation Cardiac ablation is a procedure to destroy (ablate) some heart tissue that is  sending bad signals. These bad signals cause problems in heart rhythm. The heart has many areas that make these signals. If there are problems in these areas, they can make the heart beat in a way that is not normal. Destroying some tissues can help make the heart rhythm normal. Tell your doctor about: Any allergies you have. All medicines you are taking. These include vitamins, herbs, eye drops, creams, and over-the-counter medicines. Any problems you or family members have had with medicines that make you fall asleep (anesthetics). Any blood disorders you have. Any surgeries you have had. Any medical conditions you have, such as kidney failure. Whether you are pregnant or may be pregnant. What are the risks? This is a safe procedure. But problems may occur, including: Infection. Bruising and bleeding. Bleeding into the chest. Stroke or blood clots. Damage to nearby areas of your body. Allergies to medicines or dyes. The need for a pacemaker if the normal system is damaged. Failure of the procedure to treat the problem. What happens before the procedure? Medicines Ask your doctor about: Changing or stopping your normal medicines. This is important. Taking aspirin and ibuprofen. Do not take these medicines unless your doctor tells you to take them. Taking other medicines, vitamins, herbs, and supplements. General instructions Follow instructions from your doctor about what you cannot eat or drink. Plan to have someone take you home from the hospital or clinic. If you will be going home right after the procedure, plan to have someone with you for 24 hours. Ask your doctor what steps will be taken to prevent infection. What happens during the procedure?  An IV tube will be put into one of your veins. You will be given a medicine to help you relax. The skin on your neck or groin will be numbed. A cut (incision) will be made in your neck or groin. A needle will be put through your cut  and into a large vein. A tube (catheter) will be put into the needle. The tube will be moved to your heart. Dye may be put through the tube. This helps your doctor see your heart. Small devices (electrodes) on the tube will send out signals. A type of energy will be used to destroy some heart tissue. The tube will be taken out. Pressure will be held on your cut. This helps stop bleeding. A bandage will  be put over your cut. The exact procedure may vary among doctors and hospitals. What happens after the procedure? You will be watched until you leave the hospital or clinic. This includes checking your heart rate, breathing rate, oxygen, and blood pressure. Your cut will be watched for bleeding. You will need to lie still for a few hours. Do not drive for 24 hours or as long as your doctor tells you. Summary Cardiac ablation is a procedure to destroy some heart tissue. This is done to treat heart rhythm problems. Tell your doctor about any medical conditions you may have. Tell him or her about all medicines you are taking to treat them. This is a safe procedure. But problems may occur. These include infection, bruising, bleeding, and damage to nearby areas of your body. Follow what your doctor tells you about food and drink. You may also be told to change or stop some of your medicines. After the procedure, do not drive for 24 hours or as long as your doctor tells you. This information is not intended to replace advice given to you by your health care provider. Make sure you discuss any questions you have with your health care provider. Document Revised: 06/22/2019 Document Reviewed: 06/22/2019 Elsevier Patient Education  2022 [employer].

## 2021-08-28 NOTE — Progress Notes (Signed)
Electrophysiology Office Note   Date:  08/28/2021   ID:  Amy, Beard Apr 23, 1956, MRN [id number]  PCP:  Virgilio Belling PA-C  Cardiologist:  Katrinka Blazing Primary Electrophysiologist:  Thang Flett Jorja Loa, MD    Chief Complaint: Atrial fibrillation/flutter   History of Present Illness: Amy Beard is a 66 y.o. female who is being seen today for the evaluation of atrial fibrillation/flutter at the request of Lyn Records, MD. Presenting today for electrophysiology evaluation.  She has a history significant for obstructive sleep apnea, atrial fibrillation, moderate pericardial effusion, SLE on suppressive therapy, hypertension.  She had a subxiphoid window on 06/05/2021.  She had postoperative atrial fibrillation and unfortunately failed cardioversion.  She has since been loaded on amiodarone and had a second attempt at cardioversion but did not convert to normal rhythm.  She presents today feeling tired, fatigued, short of breath.  She is frustrated with the way she is feeling and would like to get back into normal rhythm.  Today, she denies symptoms of palpitations, chest pain, orthopnea, PND, lower extremity edema, claudication, dizziness, presyncope, syncope, bleeding, or neurologic sequela. The patient is tolerating medications without difficulties.    Past Medical History:  Diagnosis Date   GI bleeding 04/2018   Hypertension    Lupus (HCC)    Past Surgical History:  Procedure Laterality Date   CARDIOVERSION N/A 07/10/2021   Procedure: CARDIOVERSION;  Surgeon: Wendall Stade, MD;  Location: [facility] ENDOSCOPY;  Service: Cardiovascular;  Laterality: N/A;   COLONOSCOPY WITH PROPOFOL N/A 04/05/2018   Procedure: COLONOSCOPY WITH PROPOFOL;  Surgeon: Sherrilyn Rist, MD;  Location: [facility] ENDOSCOPY;  Service: Gastroenterology;  Laterality: N/A;   ESOPHAGOGASTRODUODENOSCOPY (EGD) WITH PROPOFOL N/A 04/05/2018   Procedure: ESOPHAGOGASTRODUODENOSCOPY (EGD) WITH PROPOFOL;  Surgeon: Sherrilyn Rist, MD;  Location: [facility] ENDOSCOPY;  Service: Gastroenterology;  Laterality: N/A;   VARICOSE VEIN SURGERY       Current Outpatient Medications  Medication Sig Dispense Refill   acetaminophen (TYLENOL) 500 MG tablet Take 1,000 mg by mouth daily as needed for mild pain, moderate pain, fever or headache.     albuterol (PROVENTIL HFA;VENTOLIN HFA) 108 (90 Base) MCG/ACT inhaler Inhale 1-2 puffs into the lungs every 6 (six) hours as needed for wheezing or shortness of breath.     amiodarone (PACERONE) 200 MG tablet Take 1 tablet (200 mg total) by mouth daily. 90 tablet 3   Belimumab (BENLYSTA) 200 MG/ML SOAJ Inject 200 mg into the skin every Thursday.     BREO ELLIPTA 100-25 MCG/INH AEPB Inhale 1 puff into the lungs in the morning.     carboxymethylcellul-glycerin (LUBRICATING EYE DROPS) 0.5-0.9 % ophthalmic solution Place 1 drop into both eyes 3 (three) times daily as needed for dry eyes.     cholecalciferol (VITAMIN D3) 25 MCG (1000 UNIT) tablet Take 1,000 Units by mouth daily.     clonazePAM (KLONOPIN) 0.5 MG tablet Take 0.5 mg by mouth at bedtime as needed (sleep).     ELIQUIS 5 MG TABS tablet TAKE 1 TABLET BY MOUTH TWICE A DAY 60 tablet 9   ferrous sulfate 325 (65 FE) MG EC tablet Take 325 mg by mouth See admin instructions. Take 1 tablet (325 mg) by mouth twice daily on Mondays, Wednesdays & Fridays.     hydroxychloroquine (PLAQUENIL) 200 MG tablet Take 400 mg by mouth daily.     ketoconazole (NIZORAL) 2 % cream Apply 1 application topically 2 (two) times daily as needed for rash.  Melatonin 10 MG TABS Take 10 mg by mouth at bedtime as needed (sleep).     Metoprolol Tartrate 37.5 MG TABS Take 37.5 mg by mouth in the morning, at noon, and at bedtime.     Multiple Vitamins-Minerals (MULTIVITAMIN WITH MINERALS) tablet Take 1 tablet by mouth daily. Centrum Silver     mycophenolate (CELLCEPT) 500 MG tablet Take 2 tablets by mouth in the morning and at bedtime.     polyethylene glycol  (MIRALAX / GLYCOLAX) packet Take 17 g by mouth daily as needed for mild constipation.      metoprolol tartrate (LOPRESSOR) 25 MG tablet TAKE 1 & 1/2 TABLETS (37.5 MG TOTAL) BY MOUTH 2 (TWO) TIMES DAILY. (Patient not taking: Reported on 08/28/2021) 270 tablet 3   No current facility-administered medications for this visit.    Allergies:   Hctz [hydrochlorothiazide]   Social History:  The patient  reports that she has never smoked. She has never used smokeless tobacco. She reports that she does not drink alcohol and does not use drugs.   Family History:  The patient's family history includes Diabetes Mellitus II in her father and mother.    ROS:  Please see the history of present illness.   Otherwise, review of systems is positive for none.   All other systems are reviewed and negative.    PHYSICAL EXAM: VS:  BP 120/68    Pulse 95    Ht 5\' 5"  (1.651 m)    Wt 212 lb 3.2 oz (96.3 kg)    SpO2 99%    BMI 35.31 kg/m  , BMI Body mass index is 35.31 kg/m. GEN: Well nourished, well developed, in no acute distress  HEENT: normal  Neck: no JVD, carotid bruits, or masses Cardiac: irregular; no murmurs, rubs, or gallops,no edema  Respiratory:  clear to auscultation bilaterally, normal work of breathing GI: soft, nontender, nondistended, + BS MS: no deformity or atrophy  Skin: warm and dry Neuro:  Strength and sensation are intact Psych: euthymic mood, full affect  EKG:  EKG is ordered today. Personal review of the ekg ordered shows atrial flutter  Recent Labs: 07/07/2021: BUN 9; Creatinine, Ser 0.69; Hemoglobin 11.5; Platelets 287; Potassium 4.2; Sodium 141    Lipid Panel  No results found for: CHOL, TRIG, HDL, CHOLHDL, VLDL, LDLCALC, LDLDIRECT   Wt Readings from Last 3 Encounters:  08/28/21 212 lb 3.2 oz (96.3 kg)  07/10/21 272 lb 12.8 oz (123.7 kg)  07/07/21 272 lb 12.8 oz (123.7 kg)      Other studies Reviewed: Additional studies/ records that were reviewed today include: TTE  01/06/21  Review of the above records today demonstrates:   1. Moderate pericardial effusion (small circumferential with largest,  posterior component ~ 1.7 cm) and with no evidence of increased  pericardial pressure.   2. Left ventricular ejection fraction, by estimation, is 60 to 65%. The  left ventricle has normal function. The left ventricle has no regional  wall motion abnormalities. Left ventricular diastolic parameters are  consistent with Grade II diastolic  dysfunction (pseudonormalization).   3. Right ventricular systolic function is normal. The right ventricular  size is normal. There is mildly elevated pulmonary artery systolic  pressure. The estimated right ventricular systolic pressure is 0000000 mmHg.   4. Left atrial size was moderately dilated.   5. The mitral valve is grossly normal. Mild mitral valve regurgitation.   6. Tricuspid valve regurgitation is mild to moderate.   7. The aortic valve is tricuspid.  There is mild thickening of the aortic  valve. Aortic valve regurgitation is not visualized. No aortic stenosis is  present.   8. The inferior vena cava is normal in size with greater than 50%  respiratory variability, suggesting right atrial pressure of 3 mmHg.    ASSESSMENT AND PLAN:  1.  Atrial fibrillation/flutter: Currently on amiodarone 200 mg daily, Eliquis 5 mg twice daily.  CHA2DS2-VASc of 2.  She is feeling weak and fatigued as well as short of breath.  She would prefer ablation over further medications and cardioversion.  Risk, benefits, and alternatives to EP study and radiofrequency ablation for afib were also discussed in detail today. These risks include but are not limited to stroke, bleeding, vascular damage, tamponade, perforation, damage to the esophagus, lungs, and other structures, pulmonary vein stenosis, worsening renal function, and death. The patient understands these risk and wishes to proceed.  We Emilija Bohman therefore proceed with catheter ablation at  the next available time.  Carto, ICE, anesthesia are requested for the procedure.  Lyan Moyano also obtain CT PV protocol prior to the procedure to exclude LAA thrombus and further evaluate atrial anatomy.   2.  Hypertension: Currently well controlled  3.  Pericardial effusion: Has had a window at [facility].  Not malignant.  Plan per primary cardiology.  Case discussed with primary cardiology  Current medicines are reviewed at length with the patient today.   The patient does not have concerns regarding her medicines.  The following changes were made today:  none  Labs/ tests ordered today include:  Orders Placed This Encounter  Procedures   EKG 12-Lead     Disposition:   FU with Jazzmon Prindle 3 months  Signed, Wrenly Lauritsen Meredith Leeds, MD  08/28/2021 2:02 PM     [address] [place] [address] [phone] (office) [phone] (fax)

## 2021-09-13 NOTE — Progress Notes (Signed)
Cardiology Office Note:    Date:  09/15/2021   ID:  Amy Beard, DOB 1956/02/17, MRN [id number]  PCP:  Andria Frames PA-C  Cardiologist:  Sinclair Grooms, MD   Referring MD: Andria Frames, PA-C   No chief complaint on file.   History of Present Illness:    Amy Beard is a 66 y.o. 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 Subxyphoid pericardial window Dr. Clementeen Graham, [facility] 06/05/2021. Post op AF with failed electrical CV x2 06/10/2021. Converted spontaneously on Amiodarone but now in continuous atrial flutter.  [name] saw Dr. Curt Bears August 28, 2021.  Though she had spontaneous conversion on amiodarone after a failed electrical cardioversion x 2 last fall, she has apparently gone spontaneously back into atrial flutter.  He has recommended proceeding with ablation.  Insurance has directed her to [facility] for ablation.  She has an upcoming appointment to have consultation and be considered for the procedure.  She feels better than she did in late 2022 when she was in atrial fibrillation.  I presume this is related to auto conversion to atrial flutter.  She still states that she has exertional intolerance.  Walking on a flat plane is not a particular problem.  Walking up an incline is a significant problem.  At night she feels that there is fluttering and racing related palpitations.  She denies anginal quality chest pain.  She denies lower extremity swelling.  Past Medical History:  Diagnosis Date   GI bleeding 04/2018   Hypertension    Lupus ([place])     Past Surgical History:  Procedure Laterality Date   CARDIOVERSION N/A 07/10/2021   Procedure: CARDIOVERSION;  Surgeon: Josue Hector, MD;  Location: [facility] ENDOSCOPY;  Service: Cardiovascular;  Laterality: N/A;   COLONOSCOPY WITH  PROPOFOL N/A 04/05/2018   Procedure: COLONOSCOPY WITH PROPOFOL;  Surgeon: Doran Stabler, MD;  Location: [place];  Service: Gastroenterology;  Laterality: N/A;   ESOPHAGOGASTRODUODENOSCOPY (EGD) WITH PROPOFOL N/A 04/05/2018   Procedure: ESOPHAGOGASTRODUODENOSCOPY (EGD) WITH PROPOFOL;  Surgeon: Doran Stabler, MD;  Location: [place];  Service: Gastroenterology;  Laterality: N/A;   VARICOSE VEIN SURGERY      Current Medications: Current Meds  Medication Sig   acetaminophen (TYLENOL) 500 MG tablet Take 1,000 mg by mouth daily as needed for mild pain, moderate pain, fever or headache.   albuterol (PROVENTIL HFA;VENTOLIN HFA) 108 (90 Base) MCG/ACT inhaler Inhale 1-2 puffs into the lungs every 6 (six) hours as needed for wheezing or shortness of breath.   amiodarone (PACERONE) 200 MG tablet Take 1 tablet (200 mg total) by mouth daily.   Belimumab (BENLYSTA) 200 MG/ML SOAJ Inject 200 mg into the skin every Thursday.   BREO ELLIPTA 100-25 MCG/INH AEPB Inhale 1 puff into the lungs in the morning.   carboxymethylcellul-glycerin (LUBRICATING EYE DROPS) 0.5-0.9 % ophthalmic solution Place 1 drop into both eyes 3 (three) times daily as needed for dry eyes.   cholecalciferol (VITAMIN D3) 25 MCG (1000 UNIT) tablet Take 1,000 Units by mouth daily.   clonazePAM (KLONOPIN) 0.5 MG tablet Take 0.5 mg by mouth at bedtime as needed (sleep).   ELIQUIS 5 MG TABS tablet TAKE 1 TABLET BY MOUTH TWICE A DAY   esomeprazole (NEXIUM) 20 MG capsule Take 1 capsule by mouth every morning.   ferrous sulfate 325 (  65 FE) MG EC tablet Take 325 mg by mouth See admin instructions. Take 1 tablet (325 mg) by mouth twice daily on Mondays, Wednesdays & Fridays.   hydroxychloroquine (PLAQUENIL) 200 MG tablet Take 400 mg by mouth daily.   ketoconazole (NIZORAL) 2 % cream Apply 1 application topically 2 (two) times daily as needed for rash.   Melatonin 10 MG TABS Take 10 mg by mouth at bedtime as needed (sleep).   Metoprolol  Tartrate 37.5 MG TABS Take 37.5 mg by mouth in the morning, at noon, and at bedtime.   Multiple Vitamins-Minerals (MULTIVITAMIN WITH MINERALS) tablet Take 1 tablet by mouth daily. Centrum Silver   mycophenolate (CELLCEPT) 500 MG tablet Take 2 tablets by mouth in the morning and at bedtime.   polyethylene glycol (MIRALAX / GLYCOLAX) packet Take 17 g by mouth daily as needed for mild constipation.      Allergies:   Hctz [hydrochlorothiazide]   Social History   Socioeconomic History   Marital status: Married    Spouse name: Not on file   Number of children: Not on file   Years of education: Not on file   Highest education level: Not on file  Occupational History   Not on file  Tobacco Use   Smoking status: Never   Smokeless tobacco: Never  Vaping Use   Vaping Use: Never used  Substance and Sexual Activity   Alcohol use: No   Drug use: No   Sexual activity: Not on file  Other Topics Concern   Not on file  Social History Narrative   Not on file   Social Determinants of Health   Financial Resource Strain: Not on file  Food Insecurity: Not on file  Transportation Needs: Not on file  Physical Activity: Not on file  Stress: Not on file  Social Connections: Not on file     Family History: The patient's family history includes Diabetes Mellitus II in her father and mother.  ROS:   Please see the history of present illness.    Diagnosed with mild sleep apnea in 2020.  Was to have a BiPAP/CPAP titration study which was never done.  Sleep apnea is not being treated.  All other systems reviewed and are negative.  EKGs/Labs/Other Studies Reviewed:    The following studies were reviewed today: No new imaging data.  EKG:  EKG performed in September 02, 2021 revealed atrial flutter with variable A-V conduction and heart rate less than 100 bpm.  Prominent voltage.  Recent Labs: 07/07/2021: BUN 9; Creatinine, Ser 0.69; Hemoglobin 11.5; Platelets 287; Potassium 4.2; Sodium 141   Recent Lipid Panel No results found for: CHOL, TRIG, HDL, CHOLHDL, VLDL, LDLCALC, LDLDIRECT  Physical Exam:    VS:  BP 128/78    Pulse 78    Ht 5\' 5"  (1.651 m)    Wt 213 lb 12.8 oz (97 kg)    SpO2 99%    BMI 35.58 kg/m     Wt Readings from Last 3 Encounters:  09/15/21 213 lb 12.8 oz (97 kg)  08/28/21 212 lb 3.2 oz (96.3 kg)  07/10/21 272 lb 12.8 oz (123.7 kg)     GEN: Obese. No acute distress HEENT: Normal NECK: No JVD. LYMPHATICS: No lymphadenopathy CARDIAC: No murmur. RRR no gallop, or edema. VASCULAR:  Normal Pulses. No bruits. RESPIRATORY:  Clear to auscultation without rales, wheezing or rhonchi  ABDOMEN: Soft, non-tender, non-distended, No pulsatile mass, MUSCULOSKELETAL: No deformity  SKIN: Warm and dry NEUROLOGIC:  Alert and oriented  x 3 PSYCHIATRIC:  Normal affect   ASSESSMENT:    1. Persistent atrial fibrillation ([place])   2. OSA (obstructive sleep apnea)   3. Essential hypertension   4. SLE (systemic lupus erythematosus related syndrome) ([place])   5. Chronic anticoagulation   6. H/O amiodarone therapy    PLAN:    In order of problems listed above:  She is now in atrial flutter.  She feels better in atrial flutter than she did in atrial fibrillation.  There is better rate control.  She continues on amiodarone 200 mg/day, metoprolol 37.5 mg 3 times per day, Eliquis 5 mg twice daily, and otherwise stable. Untreated.  Need to restart the process of evaluation and strategy for management. Blood pressure is well controlled on metoprolol Being followed by Dr. Joselyn Arrow.  Current therapy includes Plaquenil, Benlysta, and CellCept. Continue Eliquis. With ablation, hopefully amiodarone can be continued at some point in the future.   Extensive conversation concerning the etiology of her atrial fibrillation and atrial flutter (pericardial disease and obstructive sleep apnea are leading etiologies); indication for ablation relative to long-term outcome, and discussion  concerning medication management and the need to control rhythm is much as possible.  We also discussed the importance of obstructive sleep apnea management relative to overall success of rhythm stabilization.  55-month follow-up here or earlier if needed.  Consider SGLT2 therapy as she does have some diastolic dysfunction.  We depend on her clinical status after ablation.  Medication Adjustments/Labs and Tests Ordered: Current medicines are reviewed at length with the patient today.  Concerns regarding medicines are outlined above.  No orders of the defined types were placed in this encounter.  No orders of the defined types were placed in this encounter.   There are no Patient Instructions on file for this visit.   Signed, Sinclair Grooms, MD  09/15/2021 9:45 AM    [place]

## 2021-09-15 ENCOUNTER — Ambulatory Visit (INDEPENDENT_AMBULATORY_CARE_PROVIDER_SITE_OTHER): Payer: BLUE CROSS/BLUE SHIELD | Admitting: Interventional Cardiology

## 2021-09-15 ENCOUNTER — Encounter: Payer: Self-pay | Admitting: Interventional Cardiology

## 2021-09-15 ENCOUNTER — Other Ambulatory Visit: Payer: Self-pay

## 2021-09-15 VITALS — BP 128/78 | HR 78 | Ht 65.0 in | Wt 213.8 lb

## 2021-09-15 DIAGNOSIS — G4733 Obstructive sleep apnea (adult) (pediatric): Secondary | ICD-10-CM | POA: Diagnosis not present

## 2021-09-15 DIAGNOSIS — I4819 Other persistent atrial fibrillation: Secondary | ICD-10-CM

## 2021-09-15 DIAGNOSIS — Z7901 Long term (current) use of anticoagulants: Secondary | ICD-10-CM

## 2021-09-15 DIAGNOSIS — Z9229 Personal history of other drug therapy: Secondary | ICD-10-CM

## 2021-09-15 DIAGNOSIS — M329 Systemic lupus erythematosus, unspecified: Secondary | ICD-10-CM | POA: Diagnosis not present

## 2021-09-15 DIAGNOSIS — I10 Essential (primary) hypertension: Secondary | ICD-10-CM

## 2021-09-15 NOTE — Patient Instructions (Signed)
Medication Instructions:  Your physician recommends that you continue on your current medications as directed. Please refer to the Current Medication list given to you today.   *If you need a refill on your cardiac medications before your next appointment, please call your pharmacy*   Lab Work: NONE If you have labs (blood work) drawn today and your tests are completely normal, you will receive your results only by: [place] (if you have MyChart) OR A paper copy in the mail If you have any lab test that is abnormal or we need to change your treatment, we will call you to review the results.   Testing/Procedures: NONE   Follow-Up: At [facility], you and your health needs are our priority.  As part of our continuing mission to provide you with exceptional heart care, we have created designated Provider Care Teams.  These Care Teams include your primary Cardiologist (physician) and Advanced Practice Providers (APPs -  Physician Assistants and Nurse Practitioners) who all work together to provide you with the care you need, when you need it.  We recommend signing up for the patient portal called "MyChart".  Sign up information is provided on this After Visit Summary.  MyChart is used to connect with patients for Virtual Visits (Telemedicine).  Patients are able to view lab/test results, encounter notes, upcoming appointments, etc.  Non-urgent messages can be sent to your provider as well.   To learn more about what you can do with MyChart, go to [email or web address].    Your next appointment:   4-6 month(s)  The format for your next appointment:   In Person  Provider:   Sinclair Grooms, MD {

## 2021-12-25 ENCOUNTER — Ambulatory Visit (INDEPENDENT_AMBULATORY_CARE_PROVIDER_SITE_OTHER): Payer: BLUE CROSS/BLUE SHIELD | Admitting: Cardiovascular Disease

## 2021-12-25 ENCOUNTER — Encounter: Payer: Self-pay | Admitting: Cardiovascular Disease

## 2021-12-25 DIAGNOSIS — I480 Paroxysmal atrial fibrillation: Secondary | ICD-10-CM | POA: Diagnosis not present

## 2021-12-25 DIAGNOSIS — I10 Essential (primary) hypertension: Secondary | ICD-10-CM

## 2021-12-25 DIAGNOSIS — I4892 Unspecified atrial flutter: Secondary | ICD-10-CM

## 2021-12-25 DIAGNOSIS — Z7901 Long term (current) use of anticoagulants: Secondary | ICD-10-CM

## 2021-12-25 DIAGNOSIS — Z9229 Personal history of other drug therapy: Secondary | ICD-10-CM

## 2021-12-25 DIAGNOSIS — M329 Systemic lupus erythematosus, unspecified: Secondary | ICD-10-CM

## 2021-12-25 DIAGNOSIS — I3139 Other pericardial effusion (noninflammatory): Secondary | ICD-10-CM

## 2021-12-25 DIAGNOSIS — I4819 Other persistent atrial fibrillation: Secondary | ICD-10-CM

## 2021-12-25 DIAGNOSIS — G4733 Obstructive sleep apnea (adult) (pediatric): Secondary | ICD-10-CM

## 2021-12-25 NOTE — Progress Notes (Signed)
Cardiology Office Note    Date:  01/04/2022   ID:  Amy Beard, DOB 04-Jun-1956, MRN [id number]  PCP:  Andria Frames, PA-C  Cardiologist:  Shelva Majestic, MD(new sleep); Dr. Daneen Schick  New sleep consult referred through the courtesy of Dr. Tamala Julian  History of Present Illness:  Amy Beard is a 66 y.o. female who is followed by Dr. Tamala Julian for cardiology care.  She has a history of pericardial disease with moderate pericardial effusion, systemic lupus erythematosus on suppressive therapy, paroxysmal atrial fibrillation, and essential hypertension.  She underwent subxiphoid pericardial window at [facility] in November 2022 and has had issues with postoperative atrial fibrillation with failed electrical cardioversion and subsequent development of atrial flutter on amiodarone therapy.  She last saw Dr. Tamala Julian on September 15, 2021 and she remained in atrial flutter and felt better when compared to when she was in atrial fibrillation.  She has continued to be on metoprolol, amiodarone, and Eliquis.  She apparently is scheduled to undergo an ablation procedure at [facility] on February 06, 2022  Apparently, the patient was initially referred for evaluation of potential sleep apnea in 2020 as part of her work-up for atrial fibrillation.  At that time, she also had complaints of fatigue, snoring, and witnessed apnea.  She underwent a diagnostic polysomnogram on September 14, 2018 and this was interpreted as showing mild overall sleep apnea with an AHI of 5/h although her respiratory disturbance index was in the moderate category at 20.4/h.  AHI during REM sleep was 12.3/h.  She had moderate snoring throughout the study and minimum oxygen desaturation to 89%.  In this patient with significant cardiovascular comorbidities including her hypertension and PAF a CPAP titration study was recommended and options including alternatives to CPAP such as a customized oral appliance could be  considered.  Apparently, she had her sleep study immediately before the start of the [place] pandemic and as result she never had a sleep evaluation and her machine was picked [facility] on Dec 19, 2018 due to no use.  With her recent development of recurrent atrial fibrillation as well as atrial flutter she is now referred by Dr. Tamala Julian for sleep consultation regarding her untreated sleep apnea.  Presently, she admits that her sleep is poor.  She typically goes to bed at 7 PM and often wakes up at 4 AM since she has to be at work between around 5 AM doing childcare.  Her sleep is disrupted with frequent awakenings, nocturia, snoring, and she often awakens with morning headaches.  She does not experience significant daytime sleepiness.  An Epworth Sleepiness Scale score was calculated in the office today and this endorsed at 1 with only a slight chance of dozing while watching TV.  She now presents for her initial sleep evaluation and further recommendations.   Past Medical History:  Diagnosis Date   GI bleeding 04/2018   Hypertension    Lupus ([place])     Past Surgical History:  Procedure Laterality Date   CARDIOVERSION N/A 07/10/2021   Procedure: CARDIOVERSION;  Surgeon: Josue Hector, MD;  Location: [facility] ENDOSCOPY;  Service: Cardiovascular;  Laterality: N/A;   COLONOSCOPY WITH PROPOFOL N/A 04/05/2018   Procedure: COLONOSCOPY WITH PROPOFOL;  Surgeon: Doran Stabler, MD;  Location: [place];  Service: Gastroenterology;  Laterality: N/A;   ESOPHAGOGASTRODUODENOSCOPY (EGD) WITH PROPOFOL N/A 04/05/2018   Procedure: ESOPHAGOGASTRODUODENOSCOPY (EGD) WITH PROPOFOL;  Surgeon: Doran Stabler, MD;  Location:  [place] ENDOSCOPY;  Service: Gastroenterology;  Laterality: N/A;   VARICOSE VEIN SURGERY      Current Medications: Outpatient Medications Prior to Visit  Medication Sig Dispense Refill   acetaminophen (TYLENOL) 500 MG tablet Take 1,000 mg by mouth daily as needed for mild pain,  moderate pain, fever or headache.     albuterol (PROVENTIL HFA;VENTOLIN HFA) 108 (90 Base) MCG/ACT inhaler Inhale 1-2 puffs into the lungs every 6 (six) hours as needed for wheezing or shortness of breath.     amiodarone (PACERONE) 200 MG tablet Take 1 tablet (200 mg total) by mouth daily. 90 tablet 3   Belimumab (BENLYSTA) 200 MG/ML SOAJ Inject 200 mg into the skin every Thursday.     BREO ELLIPTA 100-25 MCG/INH AEPB Inhale 1 puff into the lungs in the morning.     carboxymethylcellul-glycerin (LUBRICATING EYE DROPS) 0.5-0.9 % ophthalmic solution Place 1 drop into both eyes 3 (three) times daily as needed for dry eyes.     cholecalciferol (VITAMIN D3) 25 MCG (1000 UNIT) tablet Take 1,000 Units by mouth daily.     clonazePAM (KLONOPIN) 0.5 MG tablet Take 0.5 mg by mouth at bedtime as needed (sleep).     ELIQUIS 5 MG TABS tablet TAKE 1 TABLET BY MOUTH TWICE A DAY 60 tablet 9   esomeprazole (NEXIUM) 20 MG capsule Take 1 capsule by mouth every morning.     ferrous sulfate 325 (65 FE) MG EC tablet Take 325 mg by mouth See admin instructions. Take 1 tablet (325 mg) by mouth twice daily on Mondays, Wednesdays & Fridays.     hydroxychloroquine (PLAQUENIL) 200 MG tablet Take 400 mg by mouth daily.     ketoconazole (NIZORAL) 2 % cream Apply 1 application topically 2 (two) times daily as needed for rash.     Melatonin 10 MG TABS Take 10 mg by mouth at bedtime as needed (sleep).     Metoprolol Tartrate 37.5 MG TABS Take 37.5 mg by mouth in the morning, at noon, and at bedtime.     Multiple Vitamins-Minerals (MULTIVITAMIN WITH MINERALS) tablet Take 1 tablet by mouth daily. Centrum Silver     mycophenolate (CELLCEPT) 500 MG tablet Take 2 tablets by mouth in the morning and at bedtime.     polyethylene glycol (MIRALAX / GLYCOLAX) packet Take 17 g by mouth daily as needed for mild constipation.      No facility-administered medications prior to visit.     Allergies:   Hctz [hydrochlorothiazide]   Social  History   Socioeconomic History   Marital status: Married    Spouse name: Not on file   Number of children: Not on file   Years of education: Not on file   Highest education level: Not on file  Occupational History   Not on file  Tobacco Use   Smoking status: Never   Smokeless tobacco: Never  Vaping Use   Vaping Use: Never used  Substance and Sexual Activity   Alcohol use: No   Drug use: No   Sexual activity: Not on file  Other Topics Concern   Not on file  Social History Narrative   Not on file   Social Determinants of Health   Financial Resource Strain: Not on file  Food Insecurity: Not on file  Transportation Needs: Not on file  Physical Activity: Not on file  Stress: Not on file  Social Connections: Not on file    Social history is notable that she is married for 46 years.  She has 2 children and 3 grandchildren.  She was born in [place].  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  Family History:  The patient's family history includes Diabetes Mellitus II in her father and mother.  Both parents are deceased.  1 brother who is deceased had sleep apnea.  She has 3 sisters and 2 living brothers.  ROS General: Negative; No fevers, chills, or night sweats;  HEENT: Negative; No changes in vision or hearing, sinus congestion, difficulty swallowing Pulmonary: Questionable interstitial lung disease Cardiovascular: Positive for hypertension, PAF, atrial flutter, pericardial effusion, status post pericardial window GI: Negative; No nausea, vomiting, diarrhea, or abdominal pain GU: Negative; No dysuria, hematuria, or difficulty voiding Musculoskeletal: Negative; no myalgias, joint pain, or weakness Hematologic/Oncology: Negative; no easy bruising, bleeding Rheumatologic: SLE Endocrine: Negative; no heat/cold intolerance; no diabetes Neuro: Negative; no changes in balance, headaches Skin: Negative; No rashes or skin lesions Psychiatric:  Negative; No behavioral problems, depression Sleep: Negative; No snoring, daytime sleepiness, hypersomnolence, bruxism, restless legs, hypnogognic hallucinations, no cataplexy Other comprehensive 14 point system review is negative.   PHYSICAL EXAM:   VS:  BP 120/70 (BP Location: Left Arm)   Pulse 67   Ht _0  (1.676 m)   Wt 212 lb 6.4 oz (96.3 kg)   SpO2 96%   BMI 34.28 kg/m     Repeat blood pressure by me was 126/68  Wt Readings from Last 3 Encounters:  12/25/21 212 lb 6.4 oz (96.3 kg)  09/15/21 213 lb 12.8 oz (97 kg)  08/28/21 212 lb 3.2 oz (96.3 kg)    General: Alert, oriented, no distress.  Skin: normal turgor, no rashes, warm and dry HEENT: Normocephalic, atraumatic. Pupils equal round and reactive to light; sclera anicteric; extraocular muscles intact;  Nose without nasal septal hypertrophy Mouth/Parynx benign; Mallinpatti scale 3 Neck: No JVD, no carotid bruits; normal carotid upstroke Lungs: clear to ausculatation and percussion; no wheezing or rales Chest wall: without tenderness to palpitation Heart: PMI not displaced, RRR, s1 s2 normal, 1/6 systolic murmur, no diastolic murmur, no rubs, gallops, thrills, or heaves Abdomen: soft, nontender; no hepatosplenomehaly, BS+; abdominal aorta nontender and not dilated by palpation. Back: no CVA tenderness Pulses 2+ Musculoskeletal: full range of motion, normal strength, no joint deformities Extremities: no clubbing cyanosis or edema, Homan's sign negative  Neurologic: grossly nonfocal; Cranial nerves grossly wnl Psychologic: Normal mood and affect   Studies/Labs Reviewed:   ECG (independently read by me): NSR at 67; no ectopy, QTc 454  Recent Labs:    Latest Ref Rng & Units 07/07/2021    9:34 AM 01/06/2021    9:00 AM 04/24/2019    5:46 AM  BMP  Glucose 70 - 99 mg/dL 81   80   94    BUN 8 - 27 mg/dL _1 Creatinine 0.57 - 1.00 mg/dL 0.69   0.63   0.73    BUN/Creat Ratio 12 - _2 Sodium 134 - 144  mmol/L 141   139   141    Potassium 3.5 - 5.2 mmol/L 4.2   4.2   3.9    Chloride 96 - 106 mmol/L 104   100   108    CO2 20 - 29 mmol/L _3 Calcium 8.7 - 10.3 mg/dL 9.1   9.4   9.6  Latest Ref Rng & Units 08/20/2018    2:20 AM 04/02/2018    5:45 PM  Hepatic Function  Total Protein 6.5 - 8.1 g/dL 8.1   7.4    Albumin 3.5 - 5.0 g/dL 3.6   3.4    AST 15 - 41 U/L 22   20    ALT 0 - 44 U/L 13   15    Alk Phosphatase 38 - 126 U/L 65   59    Total Bilirubin 0.3 - 1.2 mg/dL 0.3   0.7         Latest Ref Rng & Units 07/07/2021    9:34 AM 04/24/2019    5:46 AM 08/20/2018    2:20 AM  CBC  WBC 3.4 - 10.8 x10E3/uL 4.5   4.7   5.0    Hemoglobin 11.1 - 15.9 g/dL 11.5   13.3   11.2    Hematocrit 34.0 - 46.6 % 36.0   44.4   39.0    Platelets 150 - 450 x10E3/uL 287   309   309     Lab Results  Component Value Date   MCV 78 (L) 07/07/2021   MCV 85.2 04/24/2019   MCV 75.6 (L) 08/20/2018   Lab Results  Component Value Date   TSH 1.635 08/20/2018   No results found for: HGBA1C   BNP No results found for: BNP  ProBNP No results found for: PROBNP   Lipid Panel  No results found for: CHOL, TRIG, HDL, CHOLHDL, VLDL, LDLCALC, LDLDIRECT, LABVLDL   RADIOLOGY: No results found.   Additional studies/ records that were reviewed today include:    09/14/2018 CLINICAL INFORMATION Sleep Study Type: NPSG   Indication for sleep study: Fatigue, Snoring, witnessed apnea, atrial fibrillation   Epworth Sleepiness Score: 2   SLEEP STUDY TECHNIQUE As per the AASM Manual for the Scoring of Sleep and Associated Events v2.3 (April 2016) with a hypopnea requiring 4% desaturations.   The channels recorded and monitored were frontal, central and occipital EEG, electrooculogram (EOG), submentalis EMG (chin), nasal and oral airflow, thoracic and abdominal wall motion, anterior tibialis EMG, snore microphone, electrocardiogram, and pulse oximetry.   MEDICATIONS Medications  self-administered by patient taken the night of the study : N/A   SLEEP ARCHITECTURE The study was initiated at 10:21:02 PM and ended at 4:33:20 AM.   Sleep onset time was 9.7 minutes and the sleep efficiency was 89.5%%. The total sleep time was 333.1 minutes.   Stage REM latency was 47.0 minutes.   The patient spent 23.4%% of the night in stage N1 sleep, 43.1%% in stage N2 sleep, 12.9%% in stage N3 and 20.6% in REM.   Alpha intrusion was absent.   Supine sleep was 99.40%.   RESPIRATORY PARAMETERS The overall apnea/hypopnea index (AHI) was 5.0 per hour.  The respiratory disturbance index (RDI) was 20.4/h. There were 1 total apneas, including 0 obstructive, 1 central and 0 mixed apneas. There were 27 hypopneas and 85 RERAs.   The AHI during Stage REM sleep was 12.3 per hour.   AHI while supine was 5.1 per hour.   The mean oxygen saturation was 97.2%. The minimum SpO2 during sleep was 89.0%.   Moderate snoring was noted during this study.   CARDIAC DATA The 2 lead EKG demonstrated sinus rhythm. The mean heart rate was 69.9 beats per minute. Other EKG findings include: None.   LEG MOVEMENT DATA The total PLMS were 0 with a resulting PLMS index of 0.0. Associated arousal  with leg movement index was 0.0 .   IMPRESSIONS - Mild obstructive sleep apnea overall (AHI 5.0/h, although RDI 20.4/h). Events were more significant during REM sleep (AHI 12.3/h) - No significant central sleep apnea occurred during this study (CAI = 0.2/h). - Mild oxygen desaturation to a nadir of 89%. - The patient snored with moderate snoring volume. - The arousal index was moderateely abnormal - No cardiac abnormalities were noted during this study. - Clinically significant periodic limb movements did not occur during sleep. No significant associated arousals.   DIAGNOSIS - Obstructive Sleep Apnea (327.23 [G47.33 ICD-10]) - Nocturnal Hypoxemia (327.26 [G47.36 ICD-10])   RECOMMENDATIONS - In this patient  with significant cardiovascular co-morbidities including hypertension and PAF recommend a CPAP titration study for optimal treatment of the patient's sleep disordered breathing. Alternatives to CPAP include a customized dental appliance.  - Efforts should be made to optimize nasal and orophayrngeal patency.  - Avoid alcohol, sedatives and other CNS depressants that may worsen sleep apnea and disrupt normal sleep architecture. - Sleep hygiene should be reviewed to assess factors that may improve sleep quality. - Weight management and regular exercise should be initiated or continued if appropriate.    ASSESSMENT:    1. OSA (obstructive sleep apnea)   2. Essential hypertension   3. Paroxysmal atrial fibrillation (HCC)   4. Paroxysmal atrial flutter ([place])   5. Chronic anticoagulation   6. H/O amiodarone therapy   7. SLE (systemic lupus erythematosus related syndrome) (HCC)   8. h/o pericardial effusion: Status post pericardial window     PLAN:  Amy Beard is a very pleasant 66 year old female who is followed by Dr. Daneen Schick for cardiology care.  She has a history of prior moderate pericardial effusion, SLE, is status post subxiphoid pericardial window in November 2022, and has had issues with postoperative atrial fibrillation with failed electrical cardioversion x2 with subsequent development of atrial flutter and apparently has been in sinus rhythm since initiation of amiodarone therapy.  She is tentatively scheduled to undergo an ablation procedure at [facility] in July.  Apparently, in early 2020 as part of her atrial fibrillation evaluation and symptoms highly suggestive of sleep apnea with frequent awakenings, nocturia, snoring, and morning headaches, she was referred to undergo a diagnostic polysomnogram.  This was done on September 14, 2018 just prior to the start of the pandemic.  She was found to have mild overall sleep apnea with an AHI of 5.0, but RDI was increased to 20.4/h and  sleep apnea was more significant during REM sleep with an AHI of 12.3/h.  She had moderate snoring throughout the study and had mild desaturation to 89%.  Apparently, due to the [place] pandemic, the sleep lab was closed in patients were not being seen in the office.  She apparently never really had any follow-up evaluation and never initiated therapy such that her machine was taken back in May 2020.  As result, over the last several years she has had poor sleep and admits to concerns for insomnia and difficulty staying asleep.  I had a very lengthy discussion with her and her husband today in the office.  I reviewed her initial diagnostic polysomnogram.  I reviewed normal sleep architecture and discussed untreated sleep apnea contributing to disruptive sleep architecture contributing to her symptomatology.  I discussed its effect on blood pressure control, potential for nocturnal arrhythmias, increased risk for atrial fibrillation and particularly recurrent risk of atrial fibrillation if sleep apnea is left untreated following cardioversion.  In addition I discussed its effect on insulin resistance, increasing inflammatory markers, GERD, and potential for nocturnal hypoxemia contributing to nocturnal coronary as well as renal vascular ischemia.  Previously she had been experiencing frequent nocturia and I discussed in detail the pathophysiology associated with this resulting increased negative intrathoracic pressure resulted in atrial stretching contributing to increasing ANP resulted in increased nocturia.  After much discussion concerning sleep apnea she would like to pursue a reevaluation.  As result we will try to schedule her for a home sleep study with plans for initiation of either CPAP titration or AutoPap depending upon the results.  I will need to see here in the office in person within 2 months of initiation of therapy.  I discussed different mask technology and potential alternatives to therapy if  necessary.  I will see her in several months for follow-up evaluation.   Medication Adjustments/Labs and Tests Ordered: Current medicines are reviewed at length with the patient today.  Concerns regarding medicines are outlined above.  Medication changes, Labs and Tests ordered today are listed in the Patient Instructions below. Patient Instructions  Medication Instructions:  The current medical regimen is effective;  continue present plan and medications as directed. Please refer to the Current Medication list given to you today.   *If you need a refill on your cardiac medications before your next appointment, please call your pharmacy*  Lab Work:    NONE     If you have labs (blood work) drawn today and your tests are completely normal, you will receive your results only by: [place] (if you have MyChart) OR  A paper copy in the mail If you have any lab test that is abnormal or we need to change your treatment, we will call you to review the results.  Testing/Procedures:  HOME SLEEP TESTING-SOMEONE WILL BE CALLING YOU TO ARRANGE  Follow-Up: Your next appointment:  05-14-2021  AT 3:00 PM  In Person with DR Charlcie Cradle     At [facility], you and your health needs are our priority.  As part of our continuing mission to provide you with exceptional heart care, we have created designated Provider Care Teams.  These Care Teams include your primary Cardiologist (physician) and Advanced Practice Providers (APPs -  Physician Assistants and Nurse Practitioners) who all work together to provide you with the care you need, when you need it.  We recommend signing up for the patient portal called "MyChart".  Sign up information is provided on this After Visit Summary.  MyChart is used to connect with patients for Virtual Visits (Telemedicine).  Patients are able to view lab/test results, encounter notes, upcoming appointments, etc.  Non-urgent messages can be sent to your provider as well.    To learn more about what you can do with MyChart, go to [email or web address].      Important Information About Sugar             Signed, Shelva Majestic, MD, [facility], [place], American Board of Sleep Medicine  01/04/2022 6:27 PM    [facility] [address] [place], [place], [place]  [address] Phone: [phone]

## 2021-12-25 NOTE — Patient Instructions (Signed)
Medication Instructions:  The current medical regimen is effective;  continue present plan and medications as directed. Please refer to the Current Medication list given to you today.   *If you need a refill on your cardiac medications before your next appointment, please call your pharmacy*  Lab Work:    NONE     If you have labs (blood work) drawn today and your tests are completely normal, you will receive your results only by: Amy Beard (if you have MyChart) OR  A paper copy in the mail If you have any lab test that is abnormal or we need to change your treatment, we will call you to review the results.  Testing/Procedures:  HOME SLEEP TESTING-SOMEONE WILL BE CALLING YOU TO ARRANGE  Follow-Up: Your next appointment:  05-14-2021  AT 3:00 PM  In Person with DR Charlcie Cradle     At [facility], you and your health needs are our priority.  As part of our continuing mission to provide you with exceptional heart care, we have created designated Provider Care Teams.  These Care Teams include your primary Cardiologist (physician) and Advanced Practice Providers (APPs -  Physician Assistants and Nurse Practitioners) who all work together to provide you with the care you need, when you need it.  We recommend signing up for the patient portal called "MyChart".  Sign up information is provided on this After Visit Summary.  MyChart is used to connect with patients for Virtual Visits (Telemedicine).  Patients are able to view lab/test results, encounter notes, upcoming appointments, etc.  Non-urgent messages can be sent to your provider as well.   To learn more about what you can do with MyChart, go to [email or web address].      Important Information About Sugar

## 2022-01-04 ENCOUNTER — Encounter: Payer: Self-pay | Admitting: Cardiovascular Disease

## 2022-01-10 ENCOUNTER — Other Ambulatory Visit: Payer: Self-pay | Admitting: Interventional Cardiology

## 2022-03-18 ENCOUNTER — Other Ambulatory Visit: Payer: Self-pay | Admitting: Interventional Cardiology

## 2022-03-31 NOTE — Progress Notes (Unsigned)
Cardiology Office Note:    Date:  04/01/2022   ID:  Amy Beard, DOB 1955/09/01, MRN [id number]  PCP:  Virgilio Belling, PA-C  Cardiologist:  Lesleigh Noe, MD   Referring MD: Virgilio Belling, PA-C   Chief Complaint  Patient presents with   Atrial Fibrillation   Hypertension   Hyperlipidemia   Follow-up    Connective tissue disease    History of Present Illness:    Amy Beard is a 66 y.o. female with a hx of OSA not on CPAP (Dr. Tresa Endo), paroxysmal atrial fibrillation (1% burden) likely secondary to pericardial disease, h/o GIB,  moderate pericardial effusion, SLE, on suppressive therapy, and essential hypertension. Subxyphoid pericardial window Dr. Nevada Crane, [facility] 06/05/2021. Post op AF with failed electrical CV x2 06/10/2021.  Subsequent ablation and now in sinus rhythm and on low-dose amiodarone 100 mg/day.  Followed by EP at [facility].   Amy Beard is doing great.  She is not short of breath.  No lower extremity swelling is occurred.  There is no peripheral edema.  She and her husband are now playing golf again.  Overall she feels very well.  Past Medical History:  Diagnosis Date   GI bleeding 04/2018   Hypertension    Lupus (HCC)     Past Surgical History:  Procedure Laterality Date   CARDIOVERSION N/A 07/10/2021   Procedure: CARDIOVERSION;  Surgeon: Wendall Stade, MD;  Location: [facility] ENDOSCOPY;  Service: Cardiovascular;  Laterality: N/A;   COLONOSCOPY WITH PROPOFOL N/A 04/05/2018   Procedure: COLONOSCOPY WITH PROPOFOL;  Surgeon: Sherrilyn Rist, MD;  Location: [facility] ENDOSCOPY;  Service: Gastroenterology;  Laterality: N/A;   ESOPHAGOGASTRODUODENOSCOPY (EGD) WITH PROPOFOL N/A 04/05/2018   Procedure: ESOPHAGOGASTRODUODENOSCOPY (EGD) WITH PROPOFOL;  Surgeon: Sherrilyn Rist, MD;  Location: [facility] ENDOSCOPY;  Service: Gastroenterology;  Laterality: N/A;   VARICOSE VEIN SURGERY      Current Medications: Current Meds  Medication Sig    acetaminophen (TYLENOL) 500 MG tablet Take 1,000 mg by mouth daily as needed for mild pain, moderate pain, fever or headache.   albuterol (PROVENTIL HFA;VENTOLIN HFA) 108 (90 Base) MCG/ACT inhaler Inhale 1-2 puffs into the lungs every 6 (six) hours as needed for wheezing or shortness of breath.   amiodarone (PACERONE) 200 MG tablet Take 100 mg by mouth daily.   Belimumab (BENLYSTA) 200 MG/ML SOAJ Inject 200 mg into the skin every Thursday.   BREO ELLIPTA 100-25 MCG/INH AEPB Inhale 1 puff into the lungs in the morning.   carboxymethylcellul-glycerin (LUBRICATING EYE DROPS) 0.5-0.9 % ophthalmic solution Place 1 drop into both eyes 3 (three) times daily as needed for dry eyes.   cholecalciferol (VITAMIN D3) 25 MCG (1000 UNIT) tablet Take 1,000 Units by mouth daily.   clonazePAM (KLONOPIN) 0.5 MG tablet Take 0.5 mg by mouth at bedtime as needed (sleep).   ELIQUIS 5 MG TABS tablet TAKE 1 TABLET BY MOUTH TWICE A DAY   esomeprazole (NEXIUM) 20 MG capsule Take 1 capsule by mouth every morning.   ferrous sulfate 325 (65 FE) MG EC tablet Take 325 mg by mouth See admin instructions. Take 1 tablet (325 mg) by mouth twice daily on Mondays, Wednesdays & Fridays.   hydroxychloroquine (PLAQUENIL) 200 MG tablet Take 400 mg by mouth daily.   ketoconazole (NIZORAL) 2 % cream Apply 1 application topically 2 (two) times daily as needed for rash.   Melatonin 10 MG TABS Take 10 mg by mouth at bedtime as  needed (sleep).   Metoprolol Tartrate 37.5 MG TABS Take 37.5 mg by mouth in the morning, at noon, and at bedtime.   Multiple Vitamins-Minerals (MULTIVITAMIN WITH MINERALS) tablet Take 1 tablet by mouth daily. Centrum Silver   mycophenolate (CELLCEPT) 500 MG tablet Take 2 tablets by mouth in the morning and at bedtime.   polyethylene glycol (MIRALAX / GLYCOLAX) packet Take 17 g by mouth daily as needed for mild constipation.      Allergies:   Hctz [hydrochlorothiazide]   Social History   Socioeconomic History    Marital status: Married    Spouse name: Not on file   Number of children: Not on file   Years of education: Not on file   Highest education level: Not on file  Occupational History   Not on file  Tobacco Use   Smoking status: Never   Smokeless tobacco: Never  Vaping Use   Vaping Use: Never used  Substance and Sexual Activity   Alcohol use: No   Drug use: No   Sexual activity: Not on file  Other Topics Concern   Not on file  Social History Narrative   Not on file   Social Determinants of Health   Financial Resource Strain: Not on file  Food Insecurity: Not on file  Transportation Needs: Not on file  Physical Activity: Not on file  Stress: Not on file  Social Connections: Not on file     Family History: The patient's family history includes Diabetes Mellitus II in her father and mother.  ROS:   Please see the history of present illness.    No chills or fever.  Appetite stable.  Weight stable.  All other systems reviewed and are negative.  EKGs/Labs/Other Studies Reviewed:    The following studies were reviewed today: No new imaging data.  EKG:  EKG Dec 25, 2021 demonstrates sinus rhythm with overall normal EKG appearance.  Recent Labs: 07/07/2021: BUN 9; Creatinine, Ser 0.69; Hemoglobin 11.5; Platelets 287; Potassium 4.2; Sodium 141  Recent Lipid Panel No results found for: "CHOL", "TRIG", "HDL", "CHOLHDL", "VLDL", "LDLCALC", "LDLDIRECT"  Physical Exam:    VS:  BP 130/78   Pulse 60   Ht 5\' 6"  (1.676 m)   Wt 216 lb 6.4 oz (98.2 kg)   SpO2 99%   BMI 34.93 kg/m     Wt Readings from Last 3 Encounters:  04/01/22 216 lb 6.4 oz (98.2 kg)  12/25/21 212 lb 6.4 oz (96.3 kg)  09/15/21 213 lb 12.8 oz (97 kg)     GEN: Overweight. No acute distress HEENT: Normal NECK: No JVD. LYMPHATICS: No lymphadenopathy CARDIAC: No murmur. RRR no gallop, or edema. VASCULAR:  Normal Pulses. No bruits. RESPIRATORY:  Clear to auscultation without rales, wheezing or rhonchi   ABDOMEN: Soft, non-tender, non-distended, No pulsatile mass, MUSCULOSKELETAL: No deformity  SKIN: Warm and dry NEUROLOGIC:  Alert and oriented x 3 PSYCHIATRIC:  Normal affect   ASSESSMENT:    1. h/o pericardial effusion: Status post pericardial window   2. PAF (paroxysmal atrial fibrillation) ([place])   3. OSA (obstructive sleep apnea)   4. Essential hypertension   5. Chronic anticoagulation   6. H/O amiodarone therapy   7. SLE (systemic lupus erythematosus related syndrome) ([place])    PLAN:    In order of problems listed above:  That is post pericardial window.  No evidence of volume overload.  No rub is heard. She is status post ablation.  Now on amiodarone 100 mg/day.  This is  being managed by EP at [facility].  Amiodarone discontinuation should be tried.  Will defer to EP specialist. CPAP compliance is encouraged. Excellent blood pressure control. Continue Eliquis therapy. Amiodarone 100 mg/day.  Trial off of amiodarone is being contemplated by EP.   Clinical follow-up with cardiology.  She will convert to [facility] for cardiology follow-up as her rheumatologist Dr. Sherryll Burger is in that system.   Medication Adjustments/Labs and Tests Ordered: Current medicines are reviewed at length with the patient today.  Concerns regarding medicines are outlined above.  No orders of the defined types were placed in this encounter.  No orders of the defined types were placed in this encounter.   Patient Instructions  Medication Instructions:  Your physician recommends that you continue on your current medications as directed. Please refer to the Current Medication list given to you today.  *If you need a refill on your cardiac medications before your next appointment, please call your pharmacy*  Lab Work: NONE  Testing/Procedures: NONE   Important Information About Sugar         Signed, Lesleigh Noe, MD  04/01/2022 10:00 AM    [facility]

## 2022-04-01 ENCOUNTER — Ambulatory Visit: Payer: Medicare Other | Attending: Interventional Cardiology | Admitting: Interventional Cardiology

## 2022-04-01 ENCOUNTER — Encounter: Payer: Self-pay | Admitting: Interventional Cardiology

## 2022-04-01 VITALS — BP 130/78 | HR 60 | Ht 66.0 in | Wt 216.4 lb

## 2022-04-01 DIAGNOSIS — M329 Systemic lupus erythematosus, unspecified: Secondary | ICD-10-CM

## 2022-04-01 DIAGNOSIS — I480 Paroxysmal atrial fibrillation: Secondary | ICD-10-CM | POA: Diagnosis not present

## 2022-04-01 DIAGNOSIS — I3139 Other pericardial effusion (noninflammatory): Secondary | ICD-10-CM | POA: Diagnosis not present

## 2022-04-01 DIAGNOSIS — I10 Essential (primary) hypertension: Secondary | ICD-10-CM

## 2022-04-01 DIAGNOSIS — Z9229 Personal history of other drug therapy: Secondary | ICD-10-CM

## 2022-04-01 DIAGNOSIS — G4733 Obstructive sleep apnea (adult) (pediatric): Secondary | ICD-10-CM | POA: Diagnosis not present

## 2022-04-01 DIAGNOSIS — Z7901 Long term (current) use of anticoagulants: Secondary | ICD-10-CM

## 2022-04-01 NOTE — Patient Instructions (Signed)
Medication Instructions:  Your physician recommends that you continue on your current medications as directed. Please refer to the Current Medication list given to you today.  *If you need a refill on your cardiac medications before your next appointment, please call your pharmacy*  Lab Work: NONE  Testing/Procedures: NONE   Important Information About Sugar       

## 2022-04-07 ENCOUNTER — Telehealth: Payer: Self-pay | Admitting: *Deleted

## 2022-04-07 NOTE — Telephone Encounter (Signed)
Phone call to [facility] with [facility] asking advice about what could be done to get this patient her machine back. Insurance denied sleep study. Jasmine December states that she will try to submit a request to the insurance company to see if she can get it "pushed through."

## 2022-05-14 ENCOUNTER — Ambulatory Visit: Payer: Medicare Other | Attending: Cardiovascular Disease | Admitting: Cardiovascular Disease

## 2022-05-21 ENCOUNTER — Other Ambulatory Visit: Payer: Self-pay | Admitting: Interventional Cardiology

## 2022-05-21 NOTE — Telephone Encounter (Signed)
Prescription refill request for Eliquis received. Indication:Afib Last office visit:8/23 Scr:0.7 Age: 66 Weight:98.2 kg  Prescription refilled

## 2022-11-03 ENCOUNTER — Other Ambulatory Visit: Payer: Self-pay

## 2022-11-03 MED ORDER — METOPROLOL TARTRATE 25 MG PO TABS: 37.5000 mg | ORAL_TABLET | Freq: Three times a day (TID) | ORAL | 4 refills | Status: AC

## 2022-11-03 NOTE — Telephone Encounter (Signed)
Per office visit with Dr. Tamala Julian on 09/15/21, Dr. Tamala Julian changed medication metoprolol to pt taking 37.5 mg 3 times daily. This was not done. Sent correct Rx to pt's pharmacy. Confirmation received. Called pt to inform her that we were sending her medication metoprolol 25 mg tablets, pt taking 1 1/2 tablets 3 times daily. Pt verbalized understanding.

## 2023-07-29 ENCOUNTER — Emergency Department (HOSPITAL_COMMUNITY)
Admission: EM | Admit: 2023-07-29 | Discharge: 2023-07-29 | Discharge status: Against Medical Advice | Payer: Medicare PPO

## 2023-07-29 ENCOUNTER — Other Ambulatory Visit: Payer: Self-pay

## 2023-07-29 NOTE — ED Notes (Signed)
Pt called x 2 for triage.

## 2023-07-29 NOTE — ED Notes (Signed)
Pt called for triage x3, no response.  

## 2023-11-10 ENCOUNTER — Emergency Department (HOSPITAL_BASED_OUTPATIENT_CLINIC_OR_DEPARTMENT_OTHER)
Admission: EM | Admit: 2023-11-10 | Discharge: 2023-11-11 | Disposition: A | Discharge status: Home / Self Care | Attending: Emergency Medicine | Admitting: Emergency Medicine

## 2023-11-10 ENCOUNTER — Other Ambulatory Visit: Payer: Self-pay

## 2023-11-10 DIAGNOSIS — R5383 Other fatigue: Secondary | ICD-10-CM | POA: Insufficient documentation

## 2023-11-10 DIAGNOSIS — R531 Weakness: Secondary | ICD-10-CM | POA: Diagnosis not present

## 2023-11-10 DIAGNOSIS — Z7901 Long term (current) use of anticoagulants: Secondary | ICD-10-CM | POA: Insufficient documentation

## 2023-11-10 DIAGNOSIS — Z79899 Other long term (current) drug therapy: Secondary | ICD-10-CM | POA: Insufficient documentation

## 2023-11-10 LAB — COMPREHENSIVE METABOLIC PANEL WITH GFR
ALT: 14 U/L (ref 0–44)
AST: 22 U/L (ref 15–41)
Albumin: 4.8 g/dL (ref 3.5–5.0)
Alkaline Phosphatase: 69 U/L (ref 38–126)
Anion gap: 11 (ref 5–15)
BUN: 10 mg/dL (ref 8–23)
CO2: 23 mmol/L (ref 22–32)
Calcium: 9.6 mg/dL (ref 8.9–10.3)
Chloride: 102 mmol/L (ref 98–111)
Creatinine, Ser: 0.62 mg/dL (ref 0.44–1.00)
GFR, Estimated: >60 mL/min (ref >60)
Glucose, Bld: 105 mg/dL — ABNORMAL HIGH (ref 70–99)
Potassium: 4.4 mmol/L (ref 3.5–5.1)
Sodium: 136 mmol/L (ref 135–145)
Total Bilirubin: 0.4 mg/dL (ref 0.0–1.2)
Total Protein: 8.3 g/dL — ABNORMAL HIGH (ref 6.5–8.1)

## 2023-11-10 LAB — CBC WITH DIFFERENTIAL/PLATELET
Abs Immature Granulocytes: 0.01 10*3/uL (ref 0.00–0.07)
Basophils Absolute: 0 10*3/uL (ref 0.0–0.1)
Basophils Relative: 0 %
Eosinophils Absolute: 0 10*3/uL (ref 0.0–0.5)
Eosinophils Relative: 0 %
HCT: 43.9 % (ref 36.0–46.0)
Hemoglobin: 13.4 g/dL (ref 12.0–15.0)
Immature Granulocytes: 0 %
Lymphocytes Relative: 12 %
Lymphs Abs: 0.9 10*3/uL (ref 0.7–4.0)
MCH: 25.2 pg — ABNORMAL LOW (ref 26.0–34.0)
MCHC: 30.5 g/dL (ref 30.0–36.0)
MCV: 82.5 fL (ref 80.0–100.0)
Monocytes Absolute: 0.5 10*3/uL (ref 0.1–1.0)
Monocytes Relative: 7 %
Neutro Abs: 5.7 10*3/uL (ref 1.7–7.7)
Neutrophils Relative %: 81 %
Platelets: 288 10*3/uL (ref 150–400)
RBC: 5.32 MIL/uL — ABNORMAL HIGH (ref 3.87–5.11)
RDW: 13.6 % (ref 11.5–15.5)
WBC: 7.1 10*3/uL (ref 4.0–10.5)
nRBC: 0 % (ref 0.0–0.2)

## 2023-11-10 LAB — TROPONIN I (HIGH SENSITIVITY): Troponin I (High Sensitivity): 3 ng/L (ref <18)

## 2023-11-10 LAB — CBG MONITORING, ED: Glucose-Capillary: 81 mg/dL (ref 70–99)

## 2023-11-10 MED ORDER — SODIUM CHLORIDE 0.9 % IV BOLUS
1000.0000 mL | Freq: Once | INTRAVENOUS | Status: AC
  Administered 2023-11-10: 1000 mL via INTRAVENOUS

## 2023-11-10 NOTE — Discharge Instructions (Addendum)
 Your lab work looks okay.  You not losing blood, no obvious electrolyte abnormalities.  No signs that this is a heart attack.  Since your symptoms started at a similar time he started taking these medications it could be related to the medications that you are taking.  Please discuss this with your bariatric physician.  Please return for worsening symptoms one-sided numbness or weakness or difficulty speech or swallowing.

## 2023-11-10 NOTE — ED Provider Notes (Signed)
 [place] EMERGENCY DEPARTMENT AT [facility] Provider Note   CSN: [id number] Arrival date & time: 11/10/23  2149     History  Chief Complaint  Patient presents with   Weakness    Amy Beard is a 68 y.o. female.  68 yo F with a chief complaint of generalized fatigue.  The patient states that she has only got about 5 hours sleep over the last 3 days.  She has any weight loss clinic and was just started on Wellbutrin and naltrexone.  She was told that it might cause some insomnia it would cause her to have some difficulties eating and drinking.  She said that she has been pretty weak and fatigued for a couple weeks.  She fell like things got worse in the past 12 to 24 hours.  Also when she eats or drinks she feels like it gets stuck in her chest and take some time to pass.  She has been able to eat and drink.  Denies vomiting or diarrhea.  Denies abdominal pain.  Denies symptoms with exertion.   Weakness      Home Medications Prior to Admission medications   Medication Sig Start Date End Date Taking? Authorizing Provider  acetaminophen (TYLENOL) 500 MG tablet Take 1,000 mg by mouth daily as needed for mild pain, moderate pain, fever or headache.    [provider]  albuterol (PROVENTIL HFA;VENTOLIN HFA) 108 (90 Base) MCG/ACT inhaler Inhale 1-2 puffs into the lungs every 6 (six) hours as needed for wheezing or shortness of breath.    [provider]  amiodarone (PACERONE) 200 MG tablet Take 100 mg by mouth daily.    [provider]  Belimumab (BENLYSTA) 200 MG/ML SOAJ Inject 200 mg into the skin every Thursday.    [provider]  BREO ELLIPTA 100-25 MCG/INH AEPB Inhale 1 puff into the lungs in the morning. 03/17/19   [provider]  carboxymethylcellul-glycerin (LUBRICATING EYE DROPS) 0.5-0.9 % ophthalmic solution Place 1 drop into both eyes 3 (three) times daily as needed for dry eyes.    [provider]   cholecalciferol (VITAMIN D3) 25 MCG (1000 UNIT) tablet Take 1,000 Units by mouth daily.    [provider]  clonazePAM (KLONOPIN) 0.5 MG tablet Take 0.5 mg by mouth at bedtime as needed (sleep). 06/18/21   [provider]  ELIQUIS 5 MG TABS tablet TAKE 1 TABLET BY MOUTH TWICE A DAY 05/21/22   Lyn Records, MD  esomeprazole (NEXIUM) 20 MG capsule Take 1 capsule by mouth every morning.    [provider]  ferrous sulfate 325 (65 FE) MG EC tablet Take 325 mg by mouth See admin instructions. Take 1 tablet (325 mg) by mouth twice daily on Mondays, Wednesdays & Fridays. 07/04/21   [provider]  hydroxychloroquine (PLAQUENIL) 200 MG tablet Take 400 mg by mouth daily. 01/23/19   [provider]  ketoconazole (NIZORAL) 2 % cream Apply 1 application topically 2 (two) times daily as needed for rash. 04/11/21   [provider]  Melatonin 10 MG TABS Take 10 mg by mouth at bedtime as needed (sleep).    [provider]  metoprolol tartrate (LOPRESSOR) 25 MG tablet Take 1.5 tablets (37.5 mg total) by mouth 3 (three) times daily. 11/03/22   Lennette Bihari, MD  Multiple Vitamins-Minerals (MULTIVITAMIN WITH MINERALS) tablet Take 1 tablet by mouth daily. Centrum Silver    [provider]  mycophenolate (CELLCEPT) 500 MG tablet Take 2  tablets by mouth in the morning and at bedtime. 08/11/21   [provider]  polyethylene glycol (MIRALAX / GLYCOLAX) packet Take 17 g by mouth daily as needed for mild constipation.     [provider]      Allergies    Hctz [hydrochlorothiazide]    Review of Systems   Review of Systems  Neurological:  Positive for weakness.    Physical Exam Updated Vital Signs BP (!) 148/77 (BP Location: Left Arm)   Pulse 62   Temp (!) 97.3 F (36.3 C)   Resp 20   Ht 5\' 7"  (1.702 m)   Wt 100.7 kg   SpO2 100%   BMI 34.77 kg/m  Physical Exam Vitals and nursing note reviewed.  Constitutional:       General: She is not in acute distress.    Appearance: She is well-developed. She is not diaphoretic.  HENT:     Head: Normocephalic and atraumatic.  Eyes:     Pupils: Pupils are equal, round, and reactive to light.  Cardiovascular:     Rate and Rhythm: Normal rate and regular rhythm.     Heart sounds: No murmur heard.    No friction rub. No gallop.  Pulmonary:     Effort: Pulmonary effort is normal.     Breath sounds: No wheezing or rales.  Abdominal:     General: There is no distension.     Palpations: Abdomen is soft.     Tenderness: There is no abdominal tenderness.  Musculoskeletal:        General: No tenderness.     Cervical back: Normal range of motion and neck supple.  Skin:    General: Skin is warm and dry.  Neurological:     Mental Status: She is alert and oriented to person, place, and time.     Cranial Nerves: Cranial nerves 2-12 are intact.     Sensory: Sensation is intact.     Motor: Motor function is intact.     Coordination: Coordination is intact.     Comments: Benign neuro exam  Psychiatric:        Behavior: Behavior normal.     ED Results / Procedures / Treatments   Labs (all labs ordered are listed, but only abnormal results are displayed) Labs Reviewed  CBC WITH DIFFERENTIAL/PLATELET - Abnormal; Notable for the following components:      Result Value   RBC 5.32 (*)    MCH 25.2 (*)    All other components within normal limits  COMPREHENSIVE METABOLIC PANEL WITH GFR - Abnormal; Notable for the following components:   Glucose, Bld 105 (*)    Total Protein 8.3 (*)    All other components within normal limits  URINALYSIS, ROUTINE W REFLEX MICROSCOPIC  CBG MONITORING, ED  TROPONIN I (HIGH SENSITIVITY)    EKG None  Radiology No results found.  Procedures Procedures    Medications Ordered in ED Medications  sodium chloride 0.9 % bolus 1,000 mL (1,000 mLs Intravenous New Bag/Given 11/10/23 2329)    ED Course/ Medical Decision Making/ A&P                                  Medical Decision Making Amount and/or Complexity of Data Reviewed Labs: ordered.   68 yo F with a chief complaints of feeling fatigued.  Patient recently started seeing a bariatric clinic and has been started on 2 new  medications.  Over the past couple weeks she has felt progressively fatigued over the last 3 days has not really been able to eat or sleep very well.  She had a bit of a headache earlier today that resolved with some Tylenol.  Has benign neurologic exam.  Lab work without significant electrolyte abnormalities.  No acute renal dysfunction no acute anemia.  LFTs and lipase were unremarkable.  Troponin negative.  I think most likely the patient's symptoms are related to her new medications.  There are certainly other things on the differential but it does not appear the patient is acutely unwell.  Encouraged her to talk with her bariatric clinic in the morning.  11:33 PM:  I have discussed the diagnosis/risks/treatment options with the patient and family.  Evaluation and diagnostic testing in the emergency department does not suggest an emergent condition requiring admission or immediate intervention beyond what has been performed at this time.  They will follow up with PCP. We also discussed returning to the ED immediately if new or worsening sx occur. We discussed the sx which are most concerning (e.g., sudden worsening pain, fever, inability to tolerate by mouth) that necessitate immediate return. Medications administered to the patient during their visit and any new prescriptions provided to the patient are listed below.  Medications given during this visit Medications  sodium chloride 0.9 % bolus 1,000 mL (1,000 mLs Intravenous New Bag/Given 11/10/23 2329)     The patient appears reasonably screen and/or stabilized for discharge and I doubt any other medical condition or other [facility] requiring further screening, evaluation, or treatment in the ED at this  time prior to discharge.          Final Clinical Impression(s) / ED Diagnoses Final diagnoses:  Other fatigue    Rx / DC Orders ED Discharge Orders     None         Melene Plan, DO 11/10/23 2333

## 2023-11-10 NOTE — ED Triage Notes (Signed)
 Pt coming in for from home with weakness, numbness, and dry mouth with trouble swallowing. Pt has hx of lupus and has intermittent neuropathy bilateral hands and feet, but states she had increased numbness in the right leg earlier.

## 2024-07-12 ENCOUNTER — Other Ambulatory Visit: Payer: Self-pay

## 2024-07-12 ENCOUNTER — Encounter (HOSPITAL_COMMUNITY): Payer: Self-pay

## 2024-07-12 ENCOUNTER — Emergency Department (HOSPITAL_COMMUNITY)

## 2024-07-12 ENCOUNTER — Emergency Department (HOSPITAL_COMMUNITY)
Admission: EM | Admit: 2024-07-12 | Discharge: 2024-07-13 | Disposition: A | Discharge status: Home / Self Care | Attending: Emergency Medicine | Admitting: Emergency Medicine

## 2024-07-12 DIAGNOSIS — R519 Headache, unspecified: Secondary | ICD-10-CM | POA: Diagnosis present

## 2024-07-12 DIAGNOSIS — R5383 Other fatigue: Secondary | ICD-10-CM | POA: Insufficient documentation

## 2024-07-12 DIAGNOSIS — I951 Orthostatic hypotension: Secondary | ICD-10-CM

## 2024-07-12 DIAGNOSIS — R42 Dizziness and giddiness: Secondary | ICD-10-CM | POA: Diagnosis not present

## 2024-07-12 DIAGNOSIS — I10 Essential (primary) hypertension: Secondary | ICD-10-CM | POA: Diagnosis not present

## 2024-07-12 DIAGNOSIS — J3489 Other specified disorders of nose and nasal sinuses: Secondary | ICD-10-CM | POA: Diagnosis not present

## 2024-07-12 DIAGNOSIS — Z7901 Long term (current) use of anticoagulants: Secondary | ICD-10-CM | POA: Insufficient documentation

## 2024-07-12 NOTE — ED Triage Notes (Signed)
 PT BIB [facility], Per EMS PT is coming from an Urgent Care for sudden onset of Nausea and weakness, PT says it is slowly starting to resolve. EMS vitals:  BP118/70  HR 60  SpO2 97% on RA  RR 18

## 2024-07-13 ENCOUNTER — Emergency Department (HOSPITAL_COMMUNITY)

## 2024-07-13 LAB — URINALYSIS, ROUTINE W REFLEX MICROSCOPIC
Bacteria, UA: NONE SEEN
Bilirubin Urine: NEGATIVE
Glucose, UA: NEGATIVE mg/dL
Hgb urine dipstick: NEGATIVE
Ketones, ur: NEGATIVE mg/dL
Nitrite: NEGATIVE
Protein, ur: NEGATIVE mg/dL
Specific Gravity, Urine: 1.004 — ABNORMAL LOW (ref 1.005–1.030)
pH: 8 (ref 5.0–8.0)

## 2024-07-13 LAB — COMPREHENSIVE METABOLIC PANEL WITH GFR
ALT: 16 U/L (ref 0–44)
AST: 28 U/L (ref 15–41)
Albumin: 4.3 g/dL (ref 3.5–5.0)
Alkaline Phosphatase: 94 U/L (ref 38–126)
Anion gap: 11 (ref 5–15)
BUN: 9 mg/dL (ref 8–23)
CO2: 25 mmol/L (ref 22–32)
Calcium: 9.3 mg/dL (ref 8.9–10.3)
Chloride: 106 mmol/L (ref 98–111)
Creatinine, Ser: 0.64 mg/dL (ref 0.44–1.00)
GFR, Estimated: >60 mL/min (ref >60)
Glucose, Bld: 122 mg/dL — ABNORMAL HIGH (ref 70–99)
Potassium: 3.6 mmol/L (ref 3.5–5.1)
Sodium: 142 mmol/L (ref 135–145)
Total Bilirubin: 0.3 mg/dL (ref 0.0–1.2)
Total Protein: 7.8 g/dL (ref 6.5–8.1)

## 2024-07-13 LAB — CBC WITH DIFFERENTIAL/PLATELET
Abs Immature Granulocytes: 0.02 K/uL (ref 0.00–0.07)
Basophils Absolute: 0 K/uL (ref 0.0–0.1)
Basophils Relative: 0 %
Eosinophils Absolute: 0 K/uL (ref 0.0–0.5)
Eosinophils Relative: 0 %
HCT: 42.9 % (ref 36.0–46.0)
Hemoglobin: 12.8 g/dL (ref 12.0–15.0)
Immature Granulocytes: 0 %
Lymphocytes Relative: 8 %
Lymphs Abs: 0.6 K/uL — ABNORMAL LOW (ref 0.7–4.0)
MCH: 25.3 pg — ABNORMAL LOW (ref 26.0–34.0)
MCHC: 29.8 g/dL — ABNORMAL LOW (ref 30.0–36.0)
MCV: 84.8 fL (ref 80.0–100.0)
Monocytes Absolute: 0.1 K/uL (ref 0.1–1.0)
Monocytes Relative: 2 %
Neutro Abs: 6.2 K/uL (ref 1.7–7.7)
Neutrophils Relative %: 90 %
Platelets: 288 K/uL (ref 150–400)
RBC: 5.06 MIL/uL (ref 3.87–5.11)
RDW: 13.8 % (ref 11.5–15.5)
WBC: 6.9 K/uL (ref 4.0–10.5)
nRBC: 0 % (ref 0.0–0.2)

## 2024-07-13 LAB — SARS CORONAVIRUS 2 BY RT PCR: SARS Coronavirus 2 by RT PCR: NEGATIVE

## 2024-07-13 LAB — TROPONIN T, HIGH SENSITIVITY
Troponin T High Sensitivity: <15 ng/L (ref 0–19)
Troponin T High Sensitivity: <15 ng/L (ref 0–19)

## 2024-07-13 MED ORDER — MAGNESIUM SULFATE 2 GM/50ML IV SOLN
2.0000 g | Freq: Once | INTRAVENOUS | Status: AC
  Administered 2024-07-13: 2 g via INTRAVENOUS
  Filled 2024-07-13: qty 50

## 2024-07-13 MED ORDER — MECLIZINE HCL 25 MG PO TABS
25.0000 mg | ORAL_TABLET | Freq: Once | ORAL | Status: AC
  Administered 2024-07-13: 25 mg via ORAL
  Filled 2024-07-13: qty 1

## 2024-07-13 MED ORDER — ACETAMINOPHEN 500 MG PO TABS
1000.0000 mg | ORAL_TABLET | Freq: Once | ORAL | Status: AC
  Administered 2024-07-13: 1000 mg via ORAL
  Filled 2024-07-13: qty 2

## 2024-07-13 MED ORDER — SODIUM CHLORIDE 0.9 % IV BOLUS
500.0000 mL | Freq: Once | INTRAVENOUS | Status: AC
  Administered 2024-07-13: 500 mL via INTRAVENOUS

## 2024-07-13 NOTE — ED Provider Notes (Signed)
 [place] [facility] Provider [facility]   CSN: [id number] Arrival date & time: 07/12/24  2314     Patient presents with: Weakness and Nausea   Amy Beard is a 68 y.o. female.   The history is provided by the patient.  URI Presenting symptoms: fatigue and rhinorrhea   Presenting symptoms: no facial pain and no fever   Presenting symptoms comment:  Frontal HA Severity:  Moderate Onset quality:  Gradual Duration:  1 day Timing:  Constant Progression:  Unchanged Chronicity:  New Relieved by:  Nothing Worsened by:  Nothing Ineffective treatments:  OTC medications (tylenol  earlier in the day) Associated symptoms: headaches   Associated symptoms: no arthralgias, no myalgias, no neck pain, no sinus pain, no swollen glands and no wheezing   Risk factors: no chronic cardiac disease, no chronic kidney disease and no diabetes mellitus   Patient with Lupus on hydroxychloroquine presents with runny nose, frontal headache fatigue and lightheadedness today.  Seen at urgent care with negative covid and flu.  Sent in for headache.     Past Medical History:  Diagnosis Date   GI bleeding 04/2018   Hypertension    Lupus        Prior to Admission medications  Medication Sig Start Date End Date Taking? Authorizing Provider  acetaminophen  (TYLENOL ) 500 MG tablet Take 1,000 mg by mouth daily as needed for mild pain, moderate pain, fever or headache.    [provider]  albuterol  (PROVENTIL  HFA;VENTOLIN  HFA) 108 (90 Base) MCG/ACT inhaler Inhale 1-2 puffs into the lungs every 6 (six) hours as needed for wheezing or shortness of breath.    [provider]  amiodarone  (PACERONE ) 200 MG tablet Take 100 mg by mouth daily.    [provider]  Belimumab (BENLYSTA) 200 MG/ML SOAJ Inject 200 mg into the skin every Thursday.    [provider]  BREO ELLIPTA 100-25 MCG/INH AEPB Inhale 1 puff into the lungs in the morning. 03/17/19    [provider]  carboxymethylcellul-glycerin (LUBRICATING EYE DROPS) 0.5-0.9 % ophthalmic solution Place 1 drop into both eyes 3 (three) times daily as needed for dry eyes.    [provider]  cholecalciferol (VITAMIN D3) 25 MCG (1000 UNIT) tablet Take 1,000 Units by mouth daily.    [provider]  clonazePAM (KLONOPIN) 0.5 MG tablet Take 0.5 mg by mouth at bedtime as needed (sleep). 06/18/21   [provider]  ELIQUIS  5 MG TABS tablet TAKE 1 TABLET BY MOUTH TWICE A DAY 05/21/22   Claudene Victory ORN, MD  esomeprazole (NEXIUM) 20 MG capsule Take 1 capsule by mouth every morning.    [provider]  ferrous sulfate  325 (65 FE) MG EC tablet Take 325 mg by mouth See admin instructions. Take 1 tablet (325 mg) by mouth twice daily on Mondays, Wednesdays & Fridays. 07/04/21   [provider]  hydroxychloroquine (PLAQUENIL) 200 MG tablet Take 400 mg by mouth daily. 01/23/19   [provider]  ketoconazole (NIZORAL) 2 % cream Apply 1 application topically 2 (two) times daily as needed for rash. 04/11/21   [provider]  Melatonin 10 MG TABS Take 10 mg by mouth at bedtime as needed (sleep).    [provider]  metoprolol  tartrate (LOPRESSOR ) 25 MG tablet Take 1.5 tablets (37.5 mg total) by mouth 3 (three) times daily. 11/03/22   Burnard Debby LABOR, MD  Multiple Vitamins-Minerals (MULTIVITAMIN WITH MINERALS) tablet Take 1 tablet by mouth  daily. Centrum Silver    [provider]  mycophenolate (CELLCEPT) 500 MG tablet Take 2 tablets by mouth in the morning and at bedtime. 08/11/21   [provider]  polyethylene glycol (MIRALAX / GLYCOLAX) packet Take 17 g by mouth daily as needed for mild constipation.     [provider]    Allergies: Hctz [hydrochlorothiazide ]    Review of Systems  Constitutional:  Positive for fatigue. Negative for fever.  HENT:  Positive for rhinorrhea. Negative for ear discharge, facial  swelling and sinus pain.   Respiratory:  Negative for shortness of breath and wheezing.   Cardiovascular:  Negative for chest pain and leg swelling.  Gastrointestinal:  Negative for abdominal pain, constipation, diarrhea, nausea and vomiting.  Genitourinary:  Negative for dysuria.  Musculoskeletal:  Negative for arthralgias, myalgias and neck pain.  Neurological:  Positive for light-headedness and headaches. Negative for dizziness, tremors, seizures, syncope, facial asymmetry, speech difficulty and weakness.  All other systems reviewed and are negative.   Updated Vital Signs BP 134/79   Pulse 68   Temp 98.1 F (36.7 C)   Resp 18   SpO2 94%   Physical Exam Vitals and nursing note reviewed.  Constitutional:      General: She is not in acute distress.    Appearance: Normal appearance. She is well-developed.  HENT:     Head: Normocephalic and atraumatic.     Nose: Nose normal.  Eyes:     Pupils: Pupils are equal, round, and reactive to light.  Cardiovascular:     Rate and Rhythm: Normal rate and regular rhythm.     Pulses: Normal pulses.     Heart sounds: Normal heart sounds.  Pulmonary:     Effort: Pulmonary effort is normal. No respiratory distress.     Breath sounds: Normal breath sounds.  Abdominal:     General: Bowel sounds are normal. There is no distension.     Palpations: Abdomen is soft.     Tenderness: There is no abdominal tenderness. There is no guarding or rebound.  Musculoskeletal:        General: Normal range of motion.     Cervical back: Normal range of motion and neck supple. No rigidity or tenderness.  Lymphadenopathy:     Cervical: No cervical adenopathy.  Skin:    General: Skin is warm and dry.     Capillary Refill: Capillary refill takes less than 2 seconds.     Findings: No erythema or rash.  Neurological:     General: No focal deficit present.     Mental Status: She is alert and oriented to person, place, and time.     Cranial Nerves: No cranial  nerve deficit.     Deep Tendon Reflexes: Reflexes normal.  Psychiatric:        Mood and Affect: Mood normal.     (all labs ordered are listed, but only abnormal results are displayed) Results for orders placed or performed during the hospital encounter of 07/12/24  SARS Coronavirus 2 by RT PCR (hospital order, performed in [facility] hospital lab) *cepheid single result test* Anterior Nasal Swab   Collection Time: 07/13/24 12:25 AM   Specimen: Anterior Nasal Swab  Result Value Ref Range   SARS Coronavirus 2 by RT PCR NEGATIVE NEGATIVE  Urinalysis, Routine w reflex microscopic -Urine, Clean Catch   Collection Time: 07/13/24 12:25 AM  Result Value Ref Range   Color, Urine COLORLESS (A) YELLOW   APPearance CLEAR CLEAR  Specific Gravity, Urine 1.004 (L) 1.005 - 1.030   pH 8.0 5.0 - 8.0   Glucose, UA NEGATIVE NEGATIVE mg/dL   Hgb urine dipstick NEGATIVE NEGATIVE   Bilirubin Urine NEGATIVE NEGATIVE   Ketones, ur NEGATIVE NEGATIVE mg/dL   Protein, ur NEGATIVE NEGATIVE mg/dL   Nitrite NEGATIVE NEGATIVE   Leukocytes,Ua SMALL (A) NEGATIVE   RBC / HPF 0-5 0 - 5 RBC/hpf   WBC, UA 0-5 0 - 5 WBC/hpf   Bacteria, UA NONE SEEN NONE SEEN   Squamous Epithelial / HPF 0-5 0 - 5 /HPF   Mucus PRESENT   CBC with Differential   Collection Time: 07/13/24 12:36 AM  Result Value Ref Range   WBC 6.9 4.0 - 10.5 K/uL   RBC 5.06 3.87 - 5.11 MIL/uL   Hemoglobin 12.8 12.0 - 15.0 g/dL   HCT 57.0 63.9 - 53.9 %   MCV 84.8 80.0 - 100.0 fL   MCH 25.3 (L) 26.0 - 34.0 pg   MCHC 29.8 (L) 30.0 - 36.0 g/dL   RDW 86.1 88.4 - 84.4 %   Platelets 288 150 - 400 K/uL   nRBC 0.0 0.0 - 0.2 %   Neutrophils Relative % 90 %   Neutro Abs 6.2 1.7 - 7.7 K/uL   Lymphocytes Relative 8 %   Lymphs Abs 0.6 (L) 0.7 - 4.0 K/uL   Monocytes Relative 2 %   Monocytes Absolute 0.1 0.1 - 1.0 K/uL   Eosinophils Relative 0 %   Eosinophils Absolute 0.0 0.0 - 0.5 K/uL   Basophils Relative 0 %   Basophils Absolute 0.0 0.0 - 0.1 K/uL    Immature Granulocytes 0 %   Abs Immature Granulocytes 0.02 0.00 - 0.07 K/uL  Comprehensive metabolic panel   Collection Time: 07/13/24 12:36 AM  Result Value Ref Range   Sodium 142 135 - 145 mmol/L   Potassium 3.6 3.5 - 5.1 mmol/L   Chloride 106 98 - 111 mmol/L   CO2 25 22 - 32 mmol/L   Glucose, Bld 122 (H) 70 - 99 mg/dL   BUN 9 8 - 23 mg/dL   Creatinine, Ser 9.35 0.44 - 1.00 mg/dL   Calcium 9.3 8.9 - 89.6 mg/dL   Total Protein 7.8 6.5 - 8.1 g/dL   Albumin 4.3 3.5 - 5.0 g/dL   AST 28 15 - 41 U/L   ALT 16 0 - 44 U/L   Alkaline Phosphatase 94 38 - 126 U/L   Total Bilirubin 0.3 0.0 - 1.2 mg/dL   GFR, Estimated >39 >39 mL/min   Anion gap 11 5 - 15  Troponin T, High Sensitivity   Collection Time: 07/13/24 12:36 AM  Result Value Ref Range   Troponin T High Sensitivity <15 0 - 19 ng/L  Troponin T, High Sensitivity   Collection Time: 07/13/24  2:13 AM  Result Value Ref Range   Troponin T High Sensitivity <15 0 - 19 ng/L   CT HEAD WO CONTRAST ( ) Result Date: 07/13/2024 EXAM: CT HEAD WITHOUT 07/13/2024 01:45:00 AM TECHNIQUE: CT of the head was performed without the administration of intravenous contrast. Automated exposure control, iterative reconstruction, and/or weight based adjustment of the mA/kV was utilized to reduce the radiation dose to as low as reasonably achievable. COMPARISON: None available. CLINICAL HISTORY: dizziness, posterior headache FINDINGS: BRAIN AND VENTRICLES: No acute intracranial hemorrhage. No mass effect or midline shift. No extra-axial fluid collection. No evidence of acute infarct. No hydrocephalus. ORBITS: No acute abnormality. SINUSES AND MASTOIDS: No acute abnormality. SOFT  TISSUES AND SKULL: No acute skull fracture. No acute soft tissue abnormality. IMPRESSION: 1. No acute intracranial abnormality. Electronically signed by: Franky Crease MD 07/13/2024 02:03 AM EST RP Workstation: [id number]   DG Chest Portable 1 View Result Date: 07/12/2024 CLINICAL  DATA:  Sudden onset nausea EXAM: PORTABLE CHEST 1 VIEW COMPARISON:  04/24/2019 FINDINGS: Mild cardiomegaly. No acute airspace disease, pleural effusion or pneumothorax IMPRESSION: No active disease. Mild cardiomegaly. Electronically Signed   By: Luke Bun M.D.   On: 07/12/2024 23:45     EKG: EKG Interpretation Date/Time:  Thursday July 13 2024 00:25:05 EST Ventricular Rate:  67 PR Interval:  156 QRS Duration:  101 QT Interval:  431 QTC Calculation: 455 R Axis:   68  Text Interpretation: Sinus rhythm Confirmed by Nettie, Lucius Wise ([id number]) on 07/13/2024 2:49:32 AM  Radiology: CT HEAD WO CONTRAST ( ) Result Date: 07/13/2024 EXAM: CT HEAD WITHOUT 07/13/2024 01:45:00 AM TECHNIQUE: CT of the head was performed without the administration of intravenous contrast. Automated exposure control, iterative reconstruction, and/or weight based adjustment of the mA/kV was utilized to reduce the radiation dose to as low as reasonably achievable. COMPARISON: None available. CLINICAL HISTORY: dizziness, posterior headache FINDINGS: BRAIN AND VENTRICLES: No acute intracranial hemorrhage. No mass effect or midline shift. No extra-axial fluid collection. No evidence of acute infarct. No hydrocephalus. ORBITS: No acute abnormality. SINUSES AND MASTOIDS: No acute abnormality. SOFT TISSUES AND SKULL: No acute skull fracture. No acute soft tissue abnormality. IMPRESSION: 1. No acute intracranial abnormality. Electronically signed by: Franky Crease MD 07/13/2024 02:03 AM EST RP Workstation: [id number]   DG Chest Portable 1 View Result Date: 07/12/2024 CLINICAL DATA:  Sudden onset nausea EXAM: PORTABLE CHEST 1 VIEW COMPARISON:  04/24/2019 FINDINGS: Mild cardiomegaly. No acute airspace disease, pleural effusion or pneumothorax IMPRESSION: No active disease. Mild cardiomegaly. Electronically Signed   By: Luke Bun M.D.   On: 07/12/2024 23:45     Procedures   Medications Ordered in the ED  acetaminophen   (TYLENOL ) tablet 1,000 mg (1,000 mg Oral Given 07/13/24 0108)  sodium chloride  0.9 % bolus 500 mL (0 mLs Intravenous Stopped 07/13/24 0255)  magnesium sulfate IVPB 2 g 50 mL (0 g Intravenous Stopped 07/13/24 0319)  meclizine (ANTIVERT) tablet 25 mg (25 mg Oral Given 07/13/24 0303)                                    Medical Decision Making Headache with rhinorrhea and fatigue   Amount and/or Complexity of Data Reviewed Independent Historian: spouse    Details: See above  External Data Reviewed: labs and notes.    Details: Urgent care visit reviewed  Labs: ordered.    Details: 2 negative troponins < 15, normal white count 6.9 , normal hemoglobin 12.8, normal platelets.urine is negative for UTI.  Normal sodium 142, normal potassium 3.6, normal creatinine  Radiology: ordered and independent interpretation performed.    Details: Negative head CT normal CXR ECG/medicine tests: ordered and independent interpretation performed. Decision-making details documented in ED Course.  Risk OTC drugs. Prescription drug management. Risk Details: Rule out for MI in the ED.  No SAH or ICH on CT, on Eliquis .  No cranial nerve deficits.  Ambulated about the department.  Pain is markedly improved post medication. Very well appearing.  I suspect this is a viral illness, not covid.  Stable for discharge with close follow up, strict returns.     Final diagnoses:  Headache disorder  Rhinorrhea  Lightheadedness  Orthostasis  Return for intractable cough, coughing up blood, fevers > 100.4 unrelieved by medication, shortness of breath, intractable vomiting, chest pain, shortness of breath, weakness, numbness, changes in speech, facial asymmetry, abdominal pain, passing out, Inability to tolerate liquids or food, cough, altered mental status or any concerns. No signs of systemic illness or infection. The patient is nontoxic-appearing on exam and vital signs are within normal limits.  I have reviewed the triage  vital signs and the nursing notes. Pertinent labs & imaging results that were available during my care of the patient were reviewed by me and considered in my medical decision making (see chart for details). After history, exam, and medical workup I feel the patient has been appropriately medically screened and is safe for discharge home. Pertinent diagnoses were discussed with the patient. Patient was given return precautions.   ED Discharge Orders     None          Alleigh Mollica, MD 07/13/24 [phone]
